# Patient Record
Sex: Female | Born: 1950 | Race: White | Hispanic: No | Smoking: Never smoker
Health system: Southern US, Community
[De-identification: ages and names within clinical notes are randomized; demographics above are authoritative.]

## PROBLEM LIST (undated history)

## (undated) DIAGNOSIS — N189 Chronic kidney disease, unspecified: Secondary | ICD-10-CM

## (undated) DIAGNOSIS — M1990 Unspecified osteoarthritis, unspecified site: Secondary | ICD-10-CM

## (undated) DIAGNOSIS — F32A Depression, unspecified: Secondary | ICD-10-CM

## (undated) DIAGNOSIS — M8580 Other specified disorders of bone density and structure, unspecified site: Secondary | ICD-10-CM

## (undated) DIAGNOSIS — K219 Gastro-esophageal reflux disease without esophagitis: Secondary | ICD-10-CM

## (undated) DIAGNOSIS — G4730 Sleep apnea, unspecified: Secondary | ICD-10-CM

## (undated) DIAGNOSIS — E781 Pure hyperglyceridemia: Secondary | ICD-10-CM

## (undated) DIAGNOSIS — F419 Anxiety disorder, unspecified: Secondary | ICD-10-CM

## (undated) DIAGNOSIS — K589 Irritable bowel syndrome without diarrhea: Secondary | ICD-10-CM

## (undated) DIAGNOSIS — R7301 Impaired fasting glucose: Secondary | ICD-10-CM

## (undated) DIAGNOSIS — E559 Vitamin D deficiency, unspecified: Secondary | ICD-10-CM

## (undated) DIAGNOSIS — H269 Unspecified cataract: Secondary | ICD-10-CM

## (undated) DIAGNOSIS — T7840XA Allergy, unspecified, initial encounter: Secondary | ICD-10-CM

## (undated) DIAGNOSIS — F329 Major depressive disorder, single episode, unspecified: Secondary | ICD-10-CM

## (undated) HISTORY — DX: Chronic kidney disease, unspecified: N18.9

## (undated) HISTORY — DX: Gastro-esophageal reflux disease without esophagitis: K21.9

## (undated) HISTORY — DX: Pure hyperglyceridemia: E78.1

## (undated) HISTORY — DX: Impaired fasting glucose: R73.01

## (undated) HISTORY — DX: Vitamin D deficiency, unspecified: E55.9

## (undated) HISTORY — DX: Unspecified osteoarthritis, unspecified site: M19.90

## (undated) HISTORY — PX: OTHER SURGICAL HISTORY: SHX169

## (undated) HISTORY — DX: Irritable bowel syndrome, unspecified: K58.9

## (undated) HISTORY — DX: Depression, unspecified: F32.A

## (undated) HISTORY — PX: EYE SURGERY: SHX253

## (undated) HISTORY — DX: Unspecified cataract: H26.9

## (undated) HISTORY — DX: Other specified disorders of bone density and structure, unspecified site: M85.80

## (undated) HISTORY — DX: Anxiety disorder, unspecified: F41.9

## (undated) HISTORY — DX: Sleep apnea, unspecified: G47.30

## (undated) HISTORY — DX: Major depressive disorder, single episode, unspecified: F32.9

## (undated) HISTORY — PX: SKIN TAG REMOVAL: SHX780

## (undated) HISTORY — DX: Allergy, unspecified, initial encounter: T78.40XA

---

## 1998-12-23 ENCOUNTER — Encounter: Payer: Self-pay | Admitting: Internal Medicine

## 1998-12-23 ENCOUNTER — Encounter: Admission: RE | Admit: 1998-12-23 | Discharge: 1998-12-23 | Payer: Self-pay | Admitting: Internal Medicine

## 2000-01-24 ENCOUNTER — Encounter: Payer: Self-pay | Admitting: Internal Medicine

## 2000-01-24 ENCOUNTER — Encounter: Admission: RE | Admit: 2000-01-24 | Discharge: 2000-01-24 | Payer: Self-pay | Admitting: Internal Medicine

## 2000-01-30 ENCOUNTER — Encounter: Payer: Self-pay | Admitting: Internal Medicine

## 2000-01-30 ENCOUNTER — Encounter: Admission: RE | Admit: 2000-01-30 | Discharge: 2000-01-30 | Payer: Self-pay | Admitting: Internal Medicine

## 2002-07-30 ENCOUNTER — Encounter: Payer: Self-pay | Admitting: Internal Medicine

## 2002-07-30 ENCOUNTER — Encounter: Admission: RE | Admit: 2002-07-30 | Discharge: 2002-07-30 | Payer: Self-pay | Admitting: Internal Medicine

## 2004-12-04 ENCOUNTER — Ambulatory Visit: Payer: Self-pay | Admitting: Gastroenterology

## 2006-07-19 ENCOUNTER — Ambulatory Visit: Payer: Self-pay

## 2006-12-03 ENCOUNTER — Ambulatory Visit: Payer: Self-pay

## 2010-07-05 LAB — HM PAP SMEAR

## 2010-09-15 ENCOUNTER — Ambulatory Visit: Payer: Self-pay

## 2010-12-12 ENCOUNTER — Ambulatory Visit: Payer: Self-pay | Admitting: Gastroenterology

## 2010-12-12 LAB — HM COLONOSCOPY

## 2010-12-13 ENCOUNTER — Ambulatory Visit: Payer: Self-pay | Admitting: Gastroenterology

## 2013-02-10 ENCOUNTER — Ambulatory Visit: Payer: Self-pay

## 2013-02-10 LAB — HM MAMMOGRAPHY

## 2015-03-02 ENCOUNTER — Other Ambulatory Visit: Payer: Self-pay | Admitting: Unknown Physician Specialty

## 2015-04-29 DIAGNOSIS — E559 Vitamin D deficiency, unspecified: Secondary | ICD-10-CM

## 2015-04-29 DIAGNOSIS — M8580 Other specified disorders of bone density and structure, unspecified site: Secondary | ICD-10-CM

## 2015-04-29 DIAGNOSIS — F332 Major depressive disorder, recurrent severe without psychotic features: Secondary | ICD-10-CM | POA: Insufficient documentation

## 2015-04-29 DIAGNOSIS — J309 Allergic rhinitis, unspecified: Secondary | ICD-10-CM

## 2015-04-29 DIAGNOSIS — K219 Gastro-esophageal reflux disease without esophagitis: Secondary | ICD-10-CM | POA: Insufficient documentation

## 2015-04-29 DIAGNOSIS — R7301 Impaired fasting glucose: Secondary | ICD-10-CM

## 2015-04-29 DIAGNOSIS — K589 Irritable bowel syndrome without diarrhea: Secondary | ICD-10-CM

## 2015-04-29 DIAGNOSIS — G4730 Sleep apnea, unspecified: Secondary | ICD-10-CM

## 2015-04-29 DIAGNOSIS — F419 Anxiety disorder, unspecified: Secondary | ICD-10-CM

## 2015-05-03 ENCOUNTER — Ambulatory Visit (INDEPENDENT_AMBULATORY_CARE_PROVIDER_SITE_OTHER): Payer: BLUE CROSS/BLUE SHIELD | Admitting: Unknown Physician Specialty

## 2015-05-03 ENCOUNTER — Encounter: Payer: Self-pay | Admitting: Unknown Physician Specialty

## 2015-05-03 VITALS — BP 123/74 | HR 78 | Temp 99.1°F | Ht 65.5 in | Wt 196.0 lb

## 2015-05-03 DIAGNOSIS — M654 Radial styloid tenosynovitis [de Quervain]: Secondary | ICD-10-CM

## 2015-05-03 NOTE — Assessment & Plan Note (Signed)
Thumb spica splint

## 2015-05-03 NOTE — Patient Instructions (Addendum)
Use a thumb spica splint.    De Quervain Tenosynovitis Tendons attach muscles to bones. They also help with joint movements. When tendons become irritated or swollen, it is called tendinitis. The extensor pollicis brevis (EPB) tendon connects the EPB muscle to a bone that is near the base of the thumb. The EPB muscle helps to straighten and extend the thumb. De Quervain tenosynovitis is a condition in which the EPB tendon lining (sheath) becomes irritated, thickened, and swollen. This condition is sometimes called stenosing tenosynovitis. This condition causes pain on the thumb side of the back of the wrist. CAUSES Causes of this condition include:  Activities that repeatedly cause your thumb and wrist to extend.  A sudden increase in activity or change in activity that affects your wrist. RISK FACTORS: This condition is more likely to develop in:  Females.  People who have diabetes.  Women who have recently given birth.  People who are over 55 years of age.  People who do activities that involve repeated hand and wrist motions, such as tennis, racquetball, volleyball, gardening, and taking care of children.  People who do heavy labor.  People who have poor wrist strength and flexibility.  People who do not warm up properly before activities. SYMPTOMS Symptoms of this condition include:  Pain or tenderness over the thumb side of the back of the wrist when your thumb and wrist are not moving.  Pain that gets worse when you straighten your thumb or extend your thumb or wrist.  Pain when the injured area is touched.  Locking or catching of the thumb joint while you bend and straighten your thumb.  Decreased thumb motion due to pain.  Swelling over the affected area. DIAGNOSIS This condition is diagnosed with a medical history and physical exam. Your health care provider will ask for details about your injury and ask about your symptoms. TREATMENT Treatment may include the  use of icing and medicines to reduce pain and swelling. You may also be advised to wear a splint or brace to limit your thumb and wrist motion. In less severe cases, treatment may also include working with a physical therapist to strengthen your wrist and calm the irritation around your EPB tendon sheath. In severe cases, surgery may be needed. HOME CARE INSTRUCTIONS If You Have a Splint or Brace:  Wear it as told by your health care provider. Remove it only as told by your health care provider.  Loosen the splint or brace if your fingers become numb and tingle, or if they turn cold and blue.  Keep the splint or brace clean and dry. Managing Pain, Stiffness, and Swelling   If directed, apply ice to the injured area.  Put ice in a plastic bag.  Place a towel between your skin and the bag.  Leave the ice on for 20 minutes, 2-3 times per day.  Move your fingers often to avoid stiffness and to lessen swelling.  Raise (elevate) the injured area above the level of your heart while you are sitting or lying down. General Instructions  Return to your normal activities as told by your health care provider. Ask your health care provider what activities are safe for you.  Take over-the-counter and prescription medicines only as told by your health care provider.  Keep all follow-up visits as told by your health care provider. This is important.  Do not drive or operate heavy machinery while taking prescription pain medicine. SEEK MEDICAL CARE IF:  Your pain, tenderness, or  swelling gets worse, even if you have had treatment.  You have numbness or tingling in your wrist, hand, or fingers on the injured side.   This information is not intended to replace advice given to you by your health care provider. Make sure you discuss any questions you have with your health care provider.   Document Released: 01/29/2005 Document Revised: 10/20/2014 Document Reviewed: 04/06/2014 Elsevier Interactive  Patient Education [employer].

## 2015-05-03 NOTE — Progress Notes (Signed)
BP 123/74 mmHg  Pulse 78  Temp(Src) 99.1 F (37.3 C)  Ht 5' 5.5" (1.664 m)  Wt 196 lb (88.905 kg)  BMI 32.11 kg/m2  SpO2 99%  LMP  (LMP Unknown)   Subjective:    Patient ID: Marie Perry, female    DOB: 24-Sep-1950, 65 y.o.   MRN: [id number]  HPI: Marie Perry is a 65 y.o. female  Chief Complaint  Patient presents with  . Hand Pain    pt states she has been having pain in her right hand, pain when gripping something   Pt c/o right sided hand pain/discomfort. Onset over 1 year ago; pt reports that the pain/discomfort has increased intermittently. Pt states she sometimes feelings like she can not grasp objects well. Recently, the pt noted "knot" at the base of the thumb. Pt states that she has seen a chiropractor and received acupuncture; unknown if the acupuncture helped. Reports numbness and tingling, intermittently. The pain/discomfort  radiates down the arm;  "it feels like an electrical zing". Denies reduced sensation. Denies inability to move joints or joint stiffness. Denies swelling in the hand or joints. Pt states that her pain/discomfort is worse with writing.   Relevant past medical, surgical, family and social history reviewed and updated as indicated. Interim medical history since our last visit reviewed. Allergies and medications reviewed and updated.  Review of Systems  Constitutional: Negative.   HENT: Negative.   Eyes: Negative.   Respiratory: Negative.   Cardiovascular: Negative.   Gastrointestinal: Negative.   Endocrine: Negative.   Genitourinary: Negative.   Musculoskeletal: Positive for arthralgias. Negative for joint swelling.       R thumb and hand pain/discomfort  Skin: Negative.   Allergic/Immunologic: Negative.   Neurological: Positive for numbness (located at the base of the thumb on the right hand; radiates down the arm).  Hematological: Negative.   Psychiatric/Behavioral: Negative.     Per HPI unless specifically indicated above      Objective:    BP 123/74 mmHg  Pulse 78  Temp(Src) 99.1 F (37.3 C)  Ht 5' 5.5" (1.664 m)  Wt 196 lb (88.905 kg)  BMI 32.11 kg/m2  SpO2 99%  LMP  (LMP Unknown)  Wt Readings from Last 3 Encounters:  05/03/15 196 lb (88.905 kg)  06/11/14 199 lb (90.266 kg)    Physical Exam  Constitutional: She is oriented to person, place, and time. She appears well-developed and well-nourished. No distress.  HENT:  Head: Normocephalic and atraumatic.  Eyes: Conjunctivae and lids are normal. Right eye exhibits no discharge. Left eye exhibits no discharge. No scleral icterus.  Cardiovascular: Normal rate.   Pulmonary/Chest: Effort normal.  Abdominal: Normal appearance. There is no splenomegaly or hepatomegaly.  Musculoskeletal:       Right wrist: She exhibits tenderness and swelling.  Neurological: She is alert and oriented to person, place, and time.  Skin: Skin is intact. No rash noted. No pallor.  Psychiatric: She has a normal mood and affect. Her behavior is normal. Judgment and thought content normal.    Results for orders placed or performed in visit on 04/29/15  HM MAMMOGRAPHY  Result Value Ref Range   HM Mammogram from PP   HM PAP SMEAR  Result Value Ref Range   HM Pap smear from PP   HM COLONOSCOPY  Result Value Ref Range   HM Colonoscopy from PP       Assessment & Plan:   Problem List Items Addressed This Visit  Unprioritized   De Quervain's tenosynovitis, right - Primary    Thumb spica splint.            Follow up plan: Return if symptoms worsen or fail to improve.

## 2015-06-24 ENCOUNTER — Encounter: Payer: BLUE CROSS/BLUE SHIELD | Admitting: Unknown Physician Specialty

## 2015-08-17 ENCOUNTER — Encounter: Payer: BLUE CROSS/BLUE SHIELD | Admitting: Unknown Physician Specialty

## 2015-08-27 ENCOUNTER — Other Ambulatory Visit: Payer: Self-pay | Admitting: Unknown Physician Specialty

## 2015-09-13 ENCOUNTER — Encounter (INDEPENDENT_AMBULATORY_CARE_PROVIDER_SITE_OTHER): Payer: Self-pay

## 2015-10-07 ENCOUNTER — Encounter: Payer: Self-pay | Admitting: Unknown Physician Specialty

## 2015-10-07 ENCOUNTER — Ambulatory Visit (INDEPENDENT_AMBULATORY_CARE_PROVIDER_SITE_OTHER): Payer: BLUE CROSS/BLUE SHIELD | Admitting: Unknown Physician Specialty

## 2015-10-07 VITALS — BP 136/92 | HR 94 | Temp 98.0°F | Ht 66.1 in | Wt 196.6 lb

## 2015-10-07 DIAGNOSIS — R7301 Impaired fasting glucose: Secondary | ICD-10-CM | POA: Diagnosis not present

## 2015-10-07 DIAGNOSIS — E669 Obesity, unspecified: Secondary | ICD-10-CM | POA: Diagnosis not present

## 2015-10-07 DIAGNOSIS — Z23 Encounter for immunization: Secondary | ICD-10-CM | POA: Diagnosis not present

## 2015-10-07 DIAGNOSIS — F332 Major depressive disorder, recurrent severe without psychotic features: Secondary | ICD-10-CM

## 2015-10-07 DIAGNOSIS — Z0000 Encounter for general adult medical examination without abnormal findings: Secondary | ICD-10-CM

## 2015-10-07 NOTE — Patient Instructions (Addendum)
Tdap Vaccine (Tetanus, Diphtheria and Pertussis): What You Need to Know 1. Why get vaccinated? Tetanus, diphtheria and pertussis are very serious diseases. Tdap vaccine can protect us from these diseases. And, Tdap vaccine given to pregnant women can protect newborn babies against pertussis. TETANUS (Lockjaw) is rare in the [place] today. It causes painful muscle tightening and stiffness, usually all over the body.  It can lead to tightening of muscles in the head and neck so you can't open your mouth, swallow, or sometimes even breathe. Tetanus kills about 1 out of 10 people who are infected even after receiving the best medical care. DIPHTHERIA is also rare in the [place] today. It can cause a thick coating to form in the back of the throat.  It can lead to breathing problems, heart failure, paralysis, and death. PERTUSSIS (Whooping Cough) causes severe coughing spells, which can cause difficulty breathing, vomiting and disturbed sleep.  It can also lead to weight loss, incontinence, and rib fractures. Up to 2 in 100 adolescents and 5 in 100 adults with pertussis are hospitalized or have complications, which could include pneumonia or death. These diseases are caused by bacteria. Diphtheria and pertussis are spread from person to person through secretions from coughing or sneezing. Tetanus enters the body through cuts, scratches, or wounds. Before vaccines, as many as 200,000 cases of diphtheria, 200,000 cases of pertussis, and hundreds of cases of tetanus, were reported in the [place] each year. Since vaccination began, reports of cases for tetanus and diphtheria have dropped by about 99% and for pertussis by about 80%. 2. Tdap vaccine Tdap vaccine can protect adolescents and adults from tetanus, diphtheria, and pertussis. One dose of Tdap is routinely given at age 11 or 12. People who did not get Tdap at that age should get it as soon as possible. Tdap is especially important  for healthcare professionals and anyone having close contact with a baby younger than 12 months. Pregnant women should get a dose of Tdap during every pregnancy, to protect the newborn from pertussis. Infants are most at risk for severe, life-threatening complications from pertussis. Another vaccine, called Td, protects against tetanus and diphtheria, but not pertussis. A Td booster should be given every 10 years. Tdap may be given as one of these boosters if you have never gotten Tdap before. Tdap may also be given after a severe cut or burn to prevent tetanus infection. Your doctor or the person giving you the vaccine can give you more information. Tdap may safely be given at the same time as other vaccines. 3. Some people should not get this vaccine  A person who has ever had a life-threatening allergic reaction after a previous dose of any diphtheria, tetanus or pertussis containing vaccine, OR has a severe allergy to any part of this vaccine, should not get Tdap vaccine. Tell the person giving the vaccine about any severe allergies.  Anyone who had coma or long repeated seizures within 7 days after a childhood dose of DTP or DTaP, or a previous dose of Tdap, should not get Tdap, unless a cause other than the vaccine was found. They can still get Td.  Talk to your doctor if you:  have seizures or another nervous system problem,  had severe pain or swelling after any vaccine containing diphtheria, tetanus or pertussis,  ever had a condition called Guillain-Barr Syndrome (GBS),  aren't feeling well on the day the shot is scheduled. 4. Risks With any medicine, including vaccines, there is   a chance of side effects. These are usually mild and go away on their own. Serious reactions are also possible but are rare. Most people who get Tdap vaccine do not have any problems with it. Mild problems following Tdap (Did not interfere with activities)  Pain where the shot was given (about 3 in 4  adolescents or 2 in 3 adults)  Redness or swelling where the shot was given (about 1 person in 5)  Mild fever of at least 100.4F (up to about 1 in 25 adolescents or 1 in 100 adults)  Headache (about 3 or 4 people in 10)  Tiredness (about 1 person in 3 or 4)  Nausea, vomiting, diarrhea, stomach ache (up to 1 in 4 adolescents or 1 in 10 adults)  Chills, sore joints (about 1 person in 10)  Body aches (about 1 person in 3 or 4)  Rash, swollen glands (uncommon) Moderate problems following Tdap (Interfered with activities, but did not require medical attention)  Pain where the shot was given (up to 1 in 5 or 6)  Redness or swelling where the shot was given (up to about 1 in 16 adolescents or 1 in 12 adults)  Fever over 102F (about 1 in 100 adolescents or 1 in 250 adults)  Headache (about 1 in 7 adolescents or 1 in 10 adults)  Nausea, vomiting, diarrhea, stomach ache (up to 1 or 3 people in 100)  Swelling of the entire arm where the shot was given (up to about 1 in 500). Severe problems following Tdap (Unable to perform usual activities; required medical attention)  Swelling, severe pain, bleeding and redness in the arm where the shot was given (rare). Problems that could happen after any vaccine:  People sometimes faint after a medical procedure, including vaccination. Sitting or lying down for about 15 minutes can help prevent fainting, and injuries caused by a fall. Tell your doctor if you feel dizzy, or have vision changes or ringing in the ears.  Some people get severe pain in the shoulder and have difficulty moving the arm where a shot was given. This happens very rarely.  Any medication can cause a severe allergic reaction. Such reactions from a vaccine are very rare, estimated at fewer than 1 in a million doses, and would happen within a few minutes to a few hours after the vaccination. As with any medicine, there is a very remote chance of a vaccine causing a serious  injury or death. The safety of vaccines is always being monitored. For more information, visit: www.cdc.gov/vaccinesafety/ 5. What if there is a serious problem? What should I look for?  Look for anything that concerns you, such as signs of a severe allergic reaction, very high fever, or unusual behavior.  Signs of a severe allergic reaction can include hives, swelling of the face and throat, difficulty breathing, a fast heartbeat, dizziness, and weakness. These would usually start a few minutes to a few hours after the vaccination. What should I do?  If you think it is a severe allergic reaction or other emergency that can't wait, call 9-1-1 or get the person to the nearest hospital. Otherwise, call your doctor.  Afterward, the reaction should be reported to the Vaccine Adverse Event Reporting System (VAERS). Your doctor might file this report, or you can do it yourself through the VAERS web site at www.vaers.hhs.gov, or by calling 1-800-822-7967. VAERS does not give medical advice.  6. The National Vaccine Injury Compensation Program The National Vaccine Injury Compensation Program (  VICP) is a federal program that was created to compensate people who may have been injured by certain vaccines. Persons who believe they may have been injured by a vaccine can learn about the program and about filing a claim by calling 587-585-6330 or visiting the [place] website at GoldCloset.com.ee. There is a time limit to file a claim for compensation. 7. How can I learn more?  Ask your doctor. He or she can give you the vaccine package insert or suggest other sources of information.  Call your local or state health department.  Contact the Centers for Disease Control and Prevention (CDC):  Call (540) 216-0407 (1-800-CDC-INFO) or  Visit CDC's website at http://hunter.com/ CDC Tdap Vaccine VIS (04/07/13)   This information is not intended to replace advice given to you by your health care  provider. Make sure you discuss any questions you have with your health care provider.    Check out [facility] (respite services)

## 2015-10-07 NOTE — Assessment & Plan Note (Signed)
Feels she is doing well considering care responsibilities for her mother

## 2015-10-07 NOTE — Assessment & Plan Note (Signed)
Check CMP.  ?

## 2015-10-07 NOTE — Progress Notes (Signed)
BP (!) 136/92 (BP Location: Left Arm, Patient Position: Sitting, Cuff Size: Large)   Pulse 94   Temp 98 F (36.7 C)   Ht 5' 6.1" (1.679 m)   Wt 196 lb 9.6 oz (89.2 kg)   LMP  (LMP Unknown)   SpO2 96%   BMI 31.64 kg/m    Subjective:    Patient ID: Marie Perry, female    DOB: 07/22/1950, 65 y.o.   MRN: [id number]  HPI: Marie Perry is a 65 y.o. female  Chief Complaint  Patient presents with  . Annual Exam   In general aches and pains while getting older.    Depression Taking Cymbalta 60 mg.  Depression screen PHQ 2/9 10/07/2015  Decreased Interest 2  Down, Depressed, Hopeless 1  PHQ - 2 Score 3  Altered sleeping 2  Tired, decreased energy 2  Change in appetite 1  Feeling bad or failure about yourself  2  Trouble concentrating 2  Moving slowly or fidgety/restless 0  Suicidal thoughts 0  PHQ-9 Score 12   Past Surgical History:  Procedure Laterality Date  . bright's surgery    . SKIN TAG REMOVAL     rectal   Past Medical History:  Diagnosis Date  . Allergy   . Anxiety   . Depression   . GERD (gastroesophageal reflux disease)   . IBS (irritable bowel syndrome)   . IFG (impaired fasting glucose)   . Osteopenia   . Sleep apnea   . Vitamin D deficiency    Social History   Social History  . Marital status: Unknown    Spouse name: N/A  . Number of children: N/A  . Years of education: N/A   Occupational History  . Not on file.   Social History Main Topics  . Smoking status: Never Smoker  . Smokeless tobacco: Never Used  . Alcohol use 0.0 oz/week     Comment: on occasion  . Drug use: No  . Sexual activity: No   Other Topics Concern  . Not on file   Social History Narrative  . No narrative on file   Family History  Problem Relation Age of Onset  . Mental illness Mother   . Depression Mother   . Stroke Father   . Heart disease Father   . Cancer Maternal Grandmother     ovarian  . Anemia Maternal Grandmother   . Lung disease Maternal  Grandfather   . Birth defects Maternal Grandfather   . Depression Sister   . Fibromyalgia Brother   . GER disease Brother   . Heart disease Paternal Grandmother   . Birth defects Paternal Grandfather   . Depression Brother     Relevant past medical, surgical, family and social history reviewed and updated as indicated. Interim medical history since our last visit reviewed. Allergies and medications reviewed and updated.  Review of Systems  Per HPI unless specifically indicated above     Objective:    BP (!) 136/92 (BP Location: Left Arm, Patient Position: Sitting, Cuff Size: Large)   Pulse 94   Temp 98 F (36.7 C)   Ht 5' 6.1" (1.679 m)   Wt 196 lb 9.6 oz (89.2 kg)   LMP  (LMP Unknown)   SpO2 96%   BMI 31.64 kg/m   Wt Readings from Last 3 Encounters:  10/07/15 196 lb 9.6 oz (89.2 kg)  05/03/15 196 lb (88.9 kg)  06/11/14 199 lb (90.3 kg)    Physical Exam  Constitutional: She  is oriented to person, place, and time. She appears well-developed and well-nourished.  HENT:  Head: Normocephalic and atraumatic.  Eyes: Pupils are equal, round, and reactive to light. Right eye exhibits no discharge. Left eye exhibits no discharge. No scleral icterus.  Neck: Normal range of motion. Neck supple. Carotid bruit is not present. No thyromegaly present.  Cardiovascular: Normal rate, regular rhythm and normal heart sounds.  Exam reveals no gallop and no friction rub.   No murmur heard. Pulmonary/Chest: Effort normal and breath sounds normal. No respiratory distress. She has no wheezes. She has no rales.  Abdominal: Soft. Bowel sounds are normal. There is no tenderness. There is no rebound.  Genitourinary: Vagina normal and uterus normal. No breast swelling, tenderness or discharge. Cervix exhibits no motion tenderness, no discharge and no friability. Right adnexum displays no mass, no tenderness and no fullness. Left adnexum displays no mass, no tenderness and no fullness.    Musculoskeletal: Normal range of motion.  Lymphadenopathy:    She has no cervical adenopathy.  Neurological: She is alert and oriented to person, place, and time.  Skin: Skin is warm, dry and intact. No rash noted.  Psychiatric: She has a normal mood and affect. Her speech is normal and behavior is normal. Judgment and thought content normal. Cognition and memory are normal.       Assessment & Plan:   Problem List Items Addressed This Visit      Unprioritized   IFG (impaired fasting glucose)    Check CMP      Obesity    Discuss diet and exercise      Recurrent major depression-severe ([place])    Feels she is doing well considering care responsibilities for her mother       Other Visit Diagnoses    Need for diphtheria-tetanus-pertussis (Tdap) vaccine, adult/adolescent    -  Primary   Relevant Orders   Tdap vaccine greater than or equal to 7yo IM (Completed)   Annual physical exam       Relevant Orders   CBC with Differential/Platelet   Comprehensive metabolic panel   IGP, Aptima HPV, rfx 16/18,45   Lipid Panel w/o Chol/HDL Ratio   TSH   MM DIGITAL SCREENING BILATERAL       Follow up plan: Return if symptoms worsen or fail to improve.

## 2015-10-07 NOTE — Assessment & Plan Note (Signed)
Discuss diet and exercise 

## 2015-10-08 LAB — CBC WITH DIFFERENTIAL/PLATELET
Basophils Absolute: 0 10*3/uL (ref 0.0–0.2)
Basos: 0 %
EOS (ABSOLUTE): 0.2 10*3/uL (ref 0.0–0.4)
EOS: 2 %
HEMATOCRIT: 44 % (ref 34.0–46.6)
HEMOGLOBIN: 14.5 g/dL (ref 11.1–15.9)
IMMATURE GRANS (ABS): 0 10*3/uL (ref 0.0–0.1)
IMMATURE GRANULOCYTES: 0 %
LYMPHS ABS: 2.4 10*3/uL (ref 0.7–3.1)
Lymphs: 29 %
MCH: 28.8 pg (ref 26.6–33.0)
MCHC: 33 g/dL (ref 31.5–35.7)
MCV: 87 fL (ref 79–97)
Monocytes Absolute: 0.6 10*3/uL (ref 0.1–0.9)
Monocytes: 7 %
Neutrophils Absolute: 5.1 10*3/uL (ref 1.4–7.0)
Neutrophils: 62 %
Platelets: 233 10*3/uL (ref 150–379)
RBC: 5.04 x10E6/uL (ref 3.77–5.28)
RDW: 14.1 % (ref 12.3–15.4)
WBC: 8.3 10*3/uL (ref 3.4–10.8)

## 2015-10-08 LAB — LIPID PANEL W/O CHOL/HDL RATIO
Cholesterol, Total: 232 mg/dL — ABNORMAL HIGH (ref 100–199)
HDL: 44 mg/dL (ref >39)
Triglycerides: 403 mg/dL — ABNORMAL HIGH (ref 0–149)

## 2015-10-08 LAB — COMPREHENSIVE METABOLIC PANEL
ALBUMIN: 4.6 g/dL (ref 3.6–4.8)
ALT: 21 IU/L (ref 0–32)
AST: 20 IU/L (ref 0–40)
Albumin/Globulin Ratio: 1.8 (ref 1.2–2.2)
Alkaline Phosphatase: 95 IU/L (ref 39–117)
BUN / CREAT RATIO: 25 (ref 12–28)
BUN: 21 mg/dL (ref 8–27)
Bilirubin Total: 0.4 mg/dL (ref 0.0–1.2)
CALCIUM: 10.5 mg/dL — AB (ref 8.7–10.3)
CO2: 24 mmol/L (ref 18–29)
CREATININE: 0.85 mg/dL (ref 0.57–1.00)
Chloride: 102 mmol/L (ref 96–106)
GFR, EST AFRICAN AMERICAN: 84 mL/min/{1.73_m2} (ref >59)
GFR, EST NON AFRICAN AMERICAN: 73 mL/min/{1.73_m2} (ref >59)
GLOBULIN, TOTAL: 2.5 g/dL (ref 1.5–4.5)
Glucose: 83 mg/dL (ref 65–99)
Potassium: 4.4 mmol/L (ref 3.5–5.2)
SODIUM: 142 mmol/L (ref 134–144)
TOTAL PROTEIN: 7.1 g/dL (ref 6.0–8.5)

## 2015-10-08 LAB — TSH: TSH: 1.89 u[IU]/mL (ref 0.450–4.500)

## 2015-10-10 ENCOUNTER — Other Ambulatory Visit: Payer: Self-pay | Admitting: Unknown Physician Specialty

## 2015-10-10 DIAGNOSIS — E781 Pure hyperglyceridemia: Secondary | ICD-10-CM

## 2015-10-11 LAB — IGP, APTIMA HPV, RFX 16/18,45
HPV APTIMA: NEGATIVE
PAP Smear Comment: 0

## 2015-11-28 ENCOUNTER — Other Ambulatory Visit: Payer: Self-pay | Admitting: Unknown Physician Specialty

## 2015-12-02 NOTE — Telephone Encounter (Signed)
Your patient 

## 2015-12-07 ENCOUNTER — Telehealth: Payer: Self-pay | Admitting: Unknown Physician Specialty

## 2015-12-07 NOTE — Telephone Encounter (Signed)
Routing to [place] since patient specifically asked for her.

## 2015-12-07 NOTE — Telephone Encounter (Signed)
Discussed with pt about mammograms.  Order is in the chart and she will call for the appointment

## 2016-02-13 DIAGNOSIS — H269 Unspecified cataract: Secondary | ICD-10-CM

## 2016-02-13 HISTORY — DX: Unspecified cataract: H26.9

## 2016-02-24 ENCOUNTER — Ambulatory Visit
Admission: RE | Admit: 2016-02-24 | Discharge: 2016-02-24 | Disposition: A | Discharge status: Home / Self Care | Payer: Medicare Other | Source: Ambulatory Visit | Attending: Unknown Physician Specialty | Admitting: Unknown Physician Specialty

## 2016-02-24 DIAGNOSIS — Z1231 Encounter for screening mammogram for malignant neoplasm of breast: Secondary | ICD-10-CM | POA: Insufficient documentation

## 2016-02-24 DIAGNOSIS — Z0000 Encounter for general adult medical examination without abnormal findings: Secondary | ICD-10-CM

## 2016-02-25 ENCOUNTER — Other Ambulatory Visit: Payer: Self-pay | Admitting: Unknown Physician Specialty

## 2016-02-27 ENCOUNTER — Other Ambulatory Visit: Payer: Self-pay | Admitting: Unknown Physician Specialty

## 2016-02-27 ENCOUNTER — Telehealth: Payer: Self-pay | Admitting: Unknown Physician Specialty

## 2016-02-27 DIAGNOSIS — N6320 Unspecified lump in the left breast, unspecified quadrant: Secondary | ICD-10-CM

## 2016-02-27 DIAGNOSIS — R928 Other abnormal and inconclusive findings on diagnostic imaging of breast: Secondary | ICD-10-CM

## 2016-02-27 NOTE — Telephone Encounter (Signed)
Patient was told to call our office to request her old sleep study records.  She could not remember when this was done.  Thank Dennis Bast Santiago Glad  [phone]

## 2016-02-28 NOTE — Telephone Encounter (Signed)
Called and spoke to a lady at [facility]. She stated that they did have a copy of the patient's sleep report from 2008. I asked about the patient mentioning that she needed supplies for her CPAP and she provided me the number to the [facility] office who she stated handled the supplies, [phone]. Will call patient again and ask her if she is indeed needing supplies and ask her who the fax number she provided belongs to.

## 2016-02-28 NOTE — Telephone Encounter (Signed)
Called and spoke to patient. She stated that the fax number was given to her by the lady at the [facility]. I told the patient that they stated that they had the sleep study report from 2008. I asked the patient if she was just needing supplies and she stated pretty much. I let the patient know that I was given a number for the supply office in [place] and I would call them first thing tomorrow being that it is after 5 pm. Patient stated that was fine and thanked me for the help.

## 2016-02-28 NOTE — Telephone Encounter (Signed)
Called and spoke with patient. She stated that she thinks that the place she had her sleep study done years ago does not have a copy of it (we have a copy in the old system). Patient stated that she thinks she is needing new supplies. She provided me with a fax number of [phone]. I looked at the sleep study report we have and I am going to call [facility] to see exactly what they are needing.

## 2016-03-02 NOTE — Telephone Encounter (Signed)
Called and left a VM with [facility] supply asking for them to please return my call.

## 2016-03-05 NOTE — Telephone Encounter (Signed)
Aaron Edelman from [place] returned my call. I told him what was going on and he stated that we needed to do an updated CMN for the patient's CPAP supplies. Aaron Edelman stated he would fax this to me first thing in the morning. He stated it would then be up to the insurance company as to what happens next. Will wait for form to be faxed in the morning, fill it out, and fax it back to [facility]. Will then call and update patient as to what is going on.

## 2016-03-05 NOTE — Telephone Encounter (Signed)
Aaron Edelman from [place] returned my call but I was not able to take his call at the time. I called him back and left him a VM letting him know that patient's name, DOB, and what she was needing. I asked for Aaron Edelman to please return my call when he could.

## 2016-03-06 NOTE — Telephone Encounter (Signed)
Called and let patient know about the fax I received from [place] at [place]. I let her know that we filled it out and faxed it back to him. I also let the patient know that Marie Perry stated that once he gets the form back from [place], then it was pretty much up to the insurance at that point. Patient understood.

## 2016-03-06 NOTE — Telephone Encounter (Signed)
CMN received from [place] at [facility]. Signed by Malachy Mood and faxed back. Will call patient and let her know.

## 2016-03-08 ENCOUNTER — Ambulatory Visit
Admission: RE | Admit: 2016-03-08 | Discharge: 2016-03-08 | Disposition: A | Discharge status: Home / Self Care | Payer: Medicare Other | Source: Ambulatory Visit | Attending: Unknown Physician Specialty | Admitting: Unknown Physician Specialty

## 2016-03-08 DIAGNOSIS — N6324 Unspecified lump in the left breast, lower inner quadrant: Secondary | ICD-10-CM | POA: Insufficient documentation

## 2016-03-08 DIAGNOSIS — R928 Other abnormal and inconclusive findings on diagnostic imaging of breast: Secondary | ICD-10-CM

## 2016-03-08 DIAGNOSIS — N6320 Unspecified lump in the left breast, unspecified quadrant: Secondary | ICD-10-CM

## 2016-04-03 ENCOUNTER — Encounter: Payer: Self-pay | Admitting: Unknown Physician Specialty

## 2016-04-03 ENCOUNTER — Ambulatory Visit (INDEPENDENT_AMBULATORY_CARE_PROVIDER_SITE_OTHER): Payer: Medicare Other | Admitting: Unknown Physician Specialty

## 2016-04-03 DIAGNOSIS — G4733 Obstructive sleep apnea (adult) (pediatric): Secondary | ICD-10-CM | POA: Diagnosis not present

## 2016-04-03 NOTE — Progress Notes (Signed)
   BP 114/75 (BP Location: Left Arm, Patient Position: Sitting, Cuff Size: Normal)   Pulse 79   Temp 98.7 F (37.1 C)   Wt 199 lb 12.8 oz (90.6 kg)   LMP  (LMP Unknown)   SpO2 96%   BMI 32.15 kg/m    Subjective:    Patient ID: Marie Perry, female    DOB: [birth year], 66 y.o.   MRN: [id number]  HPI: Marie Perry is a 66 y.o. female  Chief Complaint  Patient presents with  . CPAP    pt is here because she needs new CPAP supplies. Note needs to have documetation about CPAP usage and benefits for Medicare purposes.    Pt currently has a CPAP she has been using for 10 years.  She needs a new machine as it's not working.  States she uses it nightly.  She states her energy level is good when she uses her CPAP.  Not falling asleep at the wheel.  She feels rested in the AM   Relevant past medical, surgical, family and social history reviewed and updated as indicated. Interim medical history since our last visit reviewed. Allergies and medications reviewed and updated.  Review of Systems  Per HPI unless specifically indicated above     Objective:    BP 114/75 (BP Location: Left Arm, Patient Position: Sitting, Cuff Size: Normal)   Pulse 79   Temp 98.7 F (37.1 C)   Wt 199 lb 12.8 oz (90.6 kg)   LMP  (LMP Unknown)   SpO2 96%   BMI 32.15 kg/m   Wt Readings from Last 3 Encounters:  04/03/16 199 lb 12.8 oz (90.6 kg)  10/07/15 196 lb 9.6 oz (89.2 kg)  05/03/15 196 lb (88.9 kg)    Physical Exam  Constitutional: She is oriented to person, place, and time. She appears well-developed and well-nourished. No distress.  HENT:  Head: Normocephalic and atraumatic.  Eyes: Conjunctivae and lids are normal. Right eye exhibits no discharge. Left eye exhibits no discharge. No scleral icterus.  Cardiovascular: Normal rate.   Pulmonary/Chest: Effort normal.  Abdominal: Normal appearance. There is no splenomegaly or hepatomegaly.  Musculoskeletal: Normal range of motion.  Neurological: She is  alert and oriented to person, place, and time.  Skin: Skin is intact. No rash noted. No pallor.  Psychiatric: She has a normal mood and affect. Her behavior is normal. Judgment and thought content normal.      Assessment & Plan:   Problem List Items Addressed This Visit      Unprioritized   Sleep apnea    Needs new CPAP supplies.        Relevant Orders   For home use only DME continuous positive airway pressure (CPAP)       Follow up plan: Return if symptoms worsen or fail to improve.

## 2016-04-03 NOTE — Assessment & Plan Note (Signed)
Needs new CPAP supplies.

## 2016-04-13 DIAGNOSIS — Z860100 Personal history of colon polyps, unspecified: Secondary | ICD-10-CM | POA: Insufficient documentation

## 2016-05-25 ENCOUNTER — Other Ambulatory Visit: Payer: Self-pay | Admitting: Unknown Physician Specialty

## 2016-06-05 DIAGNOSIS — H1045 Other chronic allergic conjunctivitis: Secondary | ICD-10-CM | POA: Diagnosis not present

## 2016-06-05 DIAGNOSIS — H2513 Age-related nuclear cataract, bilateral: Secondary | ICD-10-CM | POA: Diagnosis not present

## 2016-08-01 ENCOUNTER — Telehealth: Payer: Self-pay | Admitting: Unknown Physician Specialty

## 2016-08-01 ENCOUNTER — Other Ambulatory Visit: Payer: Self-pay

## 2016-08-01 DIAGNOSIS — N6320 Unspecified lump in the left breast, unspecified quadrant: Secondary | ICD-10-CM

## 2016-08-01 DIAGNOSIS — R928 Other abnormal and inconclusive findings on diagnostic imaging of breast: Secondary | ICD-10-CM

## 2016-08-01 NOTE — Telephone Encounter (Signed)
Looked in patient's chart on last mammogram report and additional images were recommended for possible left breast mass. Called and made sure that this is what the patient was talking about and she said it was. Will enter orders, have Cheryl sign off, and patient stated she would call to schedule her mammogram.

## 2016-08-01 NOTE — Telephone Encounter (Signed)
Patient called to see if we could ref her for a diagnostic mammogram per patient she called to schedule one but they told her we would need to request a diagnostic   Thanks  [phone]

## 2016-08-22 ENCOUNTER — Other Ambulatory Visit: Payer: Self-pay | Admitting: Unknown Physician Specialty

## 2016-09-06 ENCOUNTER — Ambulatory Visit
Admission: RE | Admit: 2016-09-06 | Discharge: 2016-09-06 | Disposition: A | Discharge status: Home / Self Care | Payer: Medicare Other | Source: Ambulatory Visit | Attending: Unknown Physician Specialty | Admitting: Unknown Physician Specialty

## 2016-09-06 DIAGNOSIS — N6489 Other specified disorders of breast: Secondary | ICD-10-CM | POA: Diagnosis not present

## 2016-09-06 DIAGNOSIS — R928 Other abnormal and inconclusive findings on diagnostic imaging of breast: Secondary | ICD-10-CM

## 2016-09-17 ENCOUNTER — Ambulatory Visit: Payer: Medicare Other | Admitting: Registered Nurse

## 2016-09-17 ENCOUNTER — Encounter
Admission: RE | Disposition: A | Discharge status: Home / Self Care | Payer: Self-pay | Source: Ambulatory Visit | Attending: Gastroenterology

## 2016-09-17 ENCOUNTER — Ambulatory Visit
Admission: RE | Admit: 2016-09-17 | Discharge: 2016-09-17 | Disposition: A | Discharge status: Home / Self Care | Payer: Medicare Other | Source: Ambulatory Visit | Attending: Gastroenterology | Admitting: Gastroenterology

## 2016-09-17 DIAGNOSIS — F329 Major depressive disorder, single episode, unspecified: Secondary | ICD-10-CM | POA: Insufficient documentation

## 2016-09-17 DIAGNOSIS — Z1211 Encounter for screening for malignant neoplasm of colon: Secondary | ICD-10-CM | POA: Diagnosis not present

## 2016-09-17 DIAGNOSIS — G4730 Sleep apnea, unspecified: Secondary | ICD-10-CM | POA: Diagnosis not present

## 2016-09-17 DIAGNOSIS — Z86010 Personal history of colonic polyps: Secondary | ICD-10-CM | POA: Insufficient documentation

## 2016-09-17 DIAGNOSIS — D123 Benign neoplasm of transverse colon: Secondary | ICD-10-CM | POA: Diagnosis not present

## 2016-09-17 DIAGNOSIS — K6389 Other specified diseases of intestine: Secondary | ICD-10-CM | POA: Diagnosis not present

## 2016-09-17 DIAGNOSIS — K5730 Diverticulosis of large intestine without perforation or abscess without bleeding: Secondary | ICD-10-CM | POA: Diagnosis not present

## 2016-09-17 DIAGNOSIS — Z538 Procedure and treatment not carried out for other reasons: Secondary | ICD-10-CM | POA: Diagnosis not present

## 2016-09-17 DIAGNOSIS — K635 Polyp of colon: Secondary | ICD-10-CM | POA: Diagnosis not present

## 2016-09-17 DIAGNOSIS — E559 Vitamin D deficiency, unspecified: Secondary | ICD-10-CM | POA: Diagnosis not present

## 2016-09-17 DIAGNOSIS — Z79899 Other long term (current) drug therapy: Secondary | ICD-10-CM | POA: Insufficient documentation

## 2016-09-17 DIAGNOSIS — F419 Anxiety disorder, unspecified: Secondary | ICD-10-CM | POA: Diagnosis not present

## 2016-09-17 DIAGNOSIS — K5790 Diverticulosis of intestine, part unspecified, without perforation or abscess without bleeding: Secondary | ICD-10-CM | POA: Diagnosis not present

## 2016-09-17 HISTORY — PX: COLONOSCOPY: SHX5424

## 2016-09-17 SURGERY — COLONOSCOPY
Anesthesia: General

## 2016-09-17 MED ORDER — MIDAZOLAM HCL 2 MG/2ML IJ SOLN
INTRAMUSCULAR | Status: DC | PRN
  Administered 2016-09-17: 1 mg via INTRAVENOUS

## 2016-09-17 MED ORDER — PROPOFOL 10 MG/ML IV BOLUS
INTRAVENOUS | Status: DC | PRN
  Administered 2016-09-17: 70 mg via INTRAVENOUS

## 2016-09-17 MED ORDER — PHENYLEPHRINE HCL 10 MG/ML IJ SOLN
INTRAMUSCULAR | Status: DC | PRN
  Administered 2016-09-17 (×2): 50 ug via INTRAVENOUS

## 2016-09-17 MED ORDER — SODIUM CHLORIDE 0.9 % IV SOLN: INTRAVENOUS | Status: DC

## 2016-09-17 MED ORDER — SODIUM CHLORIDE 0.9 % IV SOLN
INTRAVENOUS | Status: DC
  Administered 2016-09-17: 10:00:00 via INTRAVENOUS

## 2016-09-17 MED ORDER — PROPOFOL 500 MG/50ML IV EMUL
INTRAVENOUS | Status: DC | PRN
Start: 2016-09-17 — End: 2016-09-17
  Administered 2016-09-17: 140 ug/kg/min via INTRAVENOUS

## 2016-09-17 MED ORDER — LIDOCAINE HCL (CARDIAC) 20 MG/ML IV SOLN
INTRAVENOUS | Status: DC | PRN
  Administered 2016-09-17: 40 mg via INTRAVENOUS

## 2016-09-17 MED ORDER — PROPOFOL 500 MG/50ML IV EMUL
INTRAVENOUS | Status: AC
  Filled 2016-09-17: qty 50

## 2016-09-17 MED ORDER — MIDAZOLAM HCL 2 MG/2ML IJ SOLN
INTRAMUSCULAR | Status: AC
  Filled 2016-09-17: qty 2

## 2016-09-17 NOTE — Anesthesia Post-op Follow-up Note (Cosign Needed)
Anesthesia QCDR form completed.        

## 2016-09-17 NOTE — Op Note (Addendum)
[facility] Patient Name: Marie Perry Procedure Date: 09/17/2016 10:00 AM MRN: [id number] Account #: [id number] Date of Birth: November 29, 1950 Admit Type: Outpatient Age: 66 Room: [facility] ENDO ROOM 1 Gender: Female Note Status: Finalized Procedure:            Colonoscopy Indications:          Personal history of colonic polyps Providers:            Lollie Sails, MD Referring MD:         Kathrine Haddock (Referring MD) Medicines:            Monitored Anesthesia Care Complications:        No immediate complications. Procedure:            Pre-Anesthesia Assessment:                       - ASA Grade Assessment: II - A patient with mild                        systemic disease.                       After obtaining informed consent, the colonoscope was                        passed under direct vision. Throughout the procedure,                        the patient's blood pressure, pulse, and oxygen                        saturations were monitored continuously. The                        Colonoscope was introduced through the anus with the                        intention of advancing to the cecum. The scope was                        advanced to the transverse colon before the procedure                        was aborted. Medications were given. The colonoscopy                        was extremely difficult due to poor bowel prep with                        stool present. The patient tolerated the procedure well. Findings:      A 6 mm polyp was found in the splenic flexure. The polyp was sessile.       The polyp was removed with a cold snare. Resection was complete, but the       polyp tissue was not retrieved.      Multiple small-mouthed diverticula were found in the sigmoid colon and       descending colon.      A large amount of semi-solid stool was found in the transverse colon,       precluding visualization.  A moderate amount of semi-liquid  stool was found in the rectum, in the       sigmoid colon and in the descending colon, making visualization       difficult.      A diffuse area of mild melanosis was found in the entire colon. Impression:           - One 7 mm polyp at the splenic flexure, removed with a                        cold snare. Complete resection. Polyp tissue not                        retrieved.                       - Diverticulosis in the sigmoid colon and in the                        descending colon.                       - Stool in the transverse colon.                       - Stool in the rectum, in the sigmoid colon and in the                        descending colon. Recommendation:       - Discharge patient to home.                       - repeat full prep and reschedule. Procedure Code(s):    --- Professional ---                       [id number], 52, Colonoscopy, flexible; with removal of                        tumor(s), polyp(s), or other lesion(s) by snare                        technique Diagnosis Code(s):    --- Professional ---                       D12.3, Benign neoplasm of transverse colon (hepatic                        flexure or splenic flexure)                       Z86.010, Personal history of colonic polyps                       K57.30, Diverticulosis of large intestine without                        perforation or abscess without bleeding CPT copyright 2016 American Medical Association. All rights reserved. The codes documented in this report are preliminary and upon coder review may  be revised to meet current compliance requirements. Lollie Sails, MD 09/17/2016 10:43:00 AM This report has been signed electronically. Number of Addenda: 0 Note  Initiated On: 09/17/2016 10:00 AM Scope Withdrawal Time: 0 hours 3 minutes 11 seconds  Total Procedure Duration: 0 hours 27 minutes 33 seconds       [facility]

## 2016-09-17 NOTE — Anesthesia Preprocedure Evaluation (Signed)
Anesthesia Evaluation  Patient identified by MRN, date of birth, ID band Patient awake    Reviewed: Allergy & Precautions, H&P , NPO status , [name] Chart, lab work & pertinent test results  Airway Mallampati: III  TM Distance: >3 FB Neck ROM: limited    Dental  (+) Poor Dentition   Pulmonary sleep apnea ,           Cardiovascular Exercise Tolerance: Good (-) Past MI and (-) DOE negative cardio ROS       Neuro/Psych PSYCHIATRIC DISORDERS negative neurological ROS     GI/Hepatic Neg liver ROS, GERD  Medicated and Controlled,  Endo/Other  negative endocrine ROS  Renal/GU negative Renal ROS  negative genitourinary   Musculoskeletal   Abdominal   Peds  Hematology negative hematology ROS (+)   Anesthesia Other Findings Past Medical History: No date: Allergy No date: Anxiety No date: Depression No date: GERD (gastroesophageal reflux disease) No date: IBS (irritable bowel syndrome) No date: IFG (impaired fasting glucose) No date: Osteopenia No date: Sleep apnea No date: Vitamin D deficiency  Past Surgical History: No date: bright's surgery No date: SKIN TAG REMOVAL     Comment:  rectal  BMI    Body Mass Index:  27.88 kg/m      Reproductive/Obstetrics negative OB ROS                             Anesthesia Physical Anesthesia Plan  ASA: III  Anesthesia Plan: General   Post-op Pain Management:    Induction: Intravenous  PONV Risk Score and Plan:   Airway Management Planned: Natural Airway and Nasal Cannula  Additional Equipment:   Intra-op Plan:   Post-operative Plan:   Informed Consent: I have reviewed the patients History and Physical, chart, labs and discussed the procedure including the risks, benefits and alternatives for the proposed anesthesia with the patient or authorized representative who has indicated his/her understanding and acceptance.   Dental  Advisory Given  Plan Discussed with: Anesthesiologist, CRNA and Surgeon  Anesthesia Plan Comments: (Patient consented for risks of anesthesia including but not limited to:  - adverse reactions to medications - risk of intubation if required - damage to teeth, lips or other oral mucosa - sore throat or hoarseness - Damage to heart, brain, lungs or loss of life  Patient voiced understanding.)        Anesthesia Quick Evaluation

## 2016-09-17 NOTE — Anesthesia Postprocedure Evaluation (Signed)
Anesthesia Post Note  Patient: Marie Perry  Procedure(s) Performed: Procedure(s) (LRB): COLONOSCOPY (N/A)  Patient location during evaluation: Endoscopy Anesthesia Type: General Level of consciousness: awake and alert Pain management: pain level controlled Vital Signs Assessment: post-procedure vital signs reviewed and stable Respiratory status: spontaneous breathing, nonlabored ventilation, respiratory function stable and patient connected to nasal cannula oxygen Cardiovascular status: blood pressure returned to baseline and stable Postop Assessment: no signs of nausea or vomiting Anesthetic complications: no     Last Vitals:  Vitals:   09/17/16 1058 09/17/16 1118  BP: 97/75 118/86  Pulse: 69 62  Resp: 15 16  Temp:      Last Pain:  Vitals:   09/17/16 1038  TempSrc: Tympanic  PainSc:                  Precious Haws Genisis Sonnier

## 2016-09-17 NOTE — Transfer of Care (Signed)
Immediate Anesthesia Transfer of Care Note  Patient: Marie Perry  Procedure(s) Performed: Procedure(s): COLONOSCOPY (N/A)  Patient Location: PACU  Anesthesia Type:General  Level of Consciousness: sedated  Airway & Oxygen Therapy: Patient Spontanous Breathing and Patient connected to nasal cannula oxygen  Post-op Assessment: Report given to RN and Post -op Vital signs reviewed and stable  Post vital signs: Reviewed and stable  Last Vitals:  Vitals:   09/17/16 0932 09/17/16 1038  BP: (!) 124/56 (!) 90/43  Pulse: 81 68  Resp: 18 14  Temp: 36.6 C 36.4 C    Last Pain:  Vitals:   09/17/16 1038  TempSrc: Tympanic  PainSc:          Complications: No apparent anesthesia complications

## 2016-09-17 NOTE — Anesthesia Procedure Notes (Signed)
Date/Time: 09/17/2016 10:05 AM Performed by: Hedda Slade Pre-anesthesia Checklist: Emergency Drugs available, Suction available, Patient being monitored and Patient identified Patient Re-evaluated:Patient Re-evaluated prior to induction Oxygen Delivery Method: Nasal cannula

## 2016-09-17 NOTE — H&P (Signed)
Outpatient short stay form Pre-procedure 09/17/2016 9:54 AM Lollie Sails MD  Primary Physician: Granville Lewis NP  Reason for visit:  Colonoscopy  History of present illness:  Patient is a 66 year old female presenting today as above. She has a personal history of her last colonoscopy being done in 2012. There were no polyps at that time however previous procedure did show these. She tolerated her prep well. She takes no aspirin or blood thinning agents.    Current Facility-Administered Medications:  .  0.9 %  sodium chloride infusion, , Intravenous, Continuous, Lollie Sails, MD, Last Rate: 20 mL/hr at 09/17/16 0947 .  0.9 %  sodium chloride infusion, , Intravenous, Continuous, Lollie Sails, MD  Prescriptions Prior to Admission  Medication Sig Dispense Refill Last Dose  . DULoxetine (CYMBALTA) 60 MG capsule TAKE 1 CAPSULE BY MOUTH EVERY DAY. 90 capsule 0 09/16/2016 at Unknown time  . Cholecalciferol (VITAMIN D PO) Take by mouth daily.   09/14/2016  . Cyanocobalamin (B-12 PO) Take by mouth daily.   09/14/2016     No Known Allergies   Past Medical History:  Diagnosis Date  . Allergy   . Anxiety   . Depression   . GERD (gastroesophageal reflux disease)   . IBS (irritable bowel syndrome)   . IFG (impaired fasting glucose)   . Osteopenia   . Sleep apnea   . Vitamin D deficiency     Review of systems:      Physical Exam    Heart and lungs: Regular rate and rhythm without rub or gallop, lungs are bilaterally clear.    HEENT: Normocephalic atraumatic eyes are anicteric    Other:     Pertinant exam for procedure: Soft nontender nondistended bowel sounds positive normoactive.    Planned proceedures: Colonoscopy and indicated procedures. I have discussed the risks benefits and complications of procedures to include not limited to bleeding, infection, perforation and the risk of sedation and the patient wishes to proceed.    Lollie Sails,  MD Gastroenterology 09/17/2016  9:54 AM

## 2016-09-18 ENCOUNTER — Encounter: Payer: Self-pay | Admitting: Gastroenterology

## 2016-11-09 DIAGNOSIS — Z23 Encounter for immunization: Secondary | ICD-10-CM | POA: Diagnosis not present

## 2016-11-13 ENCOUNTER — Other Ambulatory Visit: Payer: Self-pay | Admitting: Unknown Physician Specialty

## 2017-02-09 ENCOUNTER — Other Ambulatory Visit: Payer: Self-pay | Admitting: Unknown Physician Specialty

## 2017-02-20 ENCOUNTER — Other Ambulatory Visit: Payer: Self-pay | Admitting: Unknown Physician Specialty

## 2017-02-22 ENCOUNTER — Ambulatory Visit (INDEPENDENT_AMBULATORY_CARE_PROVIDER_SITE_OTHER): Payer: Medicare Other

## 2017-02-22 ENCOUNTER — Ambulatory Visit (INDEPENDENT_AMBULATORY_CARE_PROVIDER_SITE_OTHER): Payer: Medicare Other | Admitting: Unknown Physician Specialty

## 2017-02-22 ENCOUNTER — Encounter: Payer: Self-pay | Admitting: Unknown Physician Specialty

## 2017-02-22 VITALS — BP 124/77 | HR 92 | Temp 98.2°F | Ht 65.5 in | Wt 175.2 lb

## 2017-02-22 VITALS — BP 124/77 | HR 92 | Temp 98.2°F | Resp 15 | Ht 65.5 in | Wt 175.2 lb

## 2017-02-22 DIAGNOSIS — E2839 Other primary ovarian failure: Secondary | ICD-10-CM | POA: Diagnosis not present

## 2017-02-22 DIAGNOSIS — Z7189 Other specified counseling: Secondary | ICD-10-CM | POA: Insufficient documentation

## 2017-02-22 DIAGNOSIS — Z114 Encounter for screening for human immunodeficiency virus [HIV]: Secondary | ICD-10-CM | POA: Diagnosis not present

## 2017-02-22 DIAGNOSIS — Z1159 Encounter for screening for other viral diseases: Secondary | ICD-10-CM

## 2017-02-22 DIAGNOSIS — R7301 Impaired fasting glucose: Secondary | ICD-10-CM | POA: Diagnosis not present

## 2017-02-22 DIAGNOSIS — K219 Gastro-esophageal reflux disease without esophagitis: Secondary | ICD-10-CM | POA: Diagnosis not present

## 2017-02-22 DIAGNOSIS — F332 Major depressive disorder, recurrent severe without psychotic features: Secondary | ICD-10-CM | POA: Diagnosis not present

## 2017-02-22 DIAGNOSIS — Z23 Encounter for immunization: Secondary | ICD-10-CM | POA: Diagnosis not present

## 2017-02-22 DIAGNOSIS — E781 Pure hyperglyceridemia: Secondary | ICD-10-CM

## 2017-02-22 DIAGNOSIS — Z0000 Encounter for general adult medical examination without abnormal findings: Secondary | ICD-10-CM

## 2017-02-22 MED ORDER — DULOXETINE HCL 60 MG PO CPEP: 60.0000 mg | ORAL_CAPSULE | Freq: Every day | ORAL | 3 refills | Status: DC

## 2017-02-22 MED ORDER — OMEPRAZOLE 20 MG PO CPDR: 20.0000 mg | DELAYED_RELEASE_CAPSULE | Freq: Every day | ORAL | 3 refills | Status: DC

## 2017-02-22 NOTE — Assessment & Plan Note (Addendum)
Stable, continue present medications.   

## 2017-02-22 NOTE — Patient Instructions (Addendum)
Pneumococcal Conjugate Vaccine (PCV13) What You Need to Know 1. Why get vaccinated? Vaccination can protect both children and adults from pneumococcal disease. Pneumococcal disease is caused by bacteria that can spread from person to person through close contact. It can cause ear infections, and it can also lead to more serious infections of the:  Lungs (pneumonia),  Blood (bacteremia), and  Covering of the brain and spinal cord (meningitis).  Pneumococcal pneumonia is most common among adults. Pneumococcal meningitis can cause deafness and brain damage, and it kills about 1 child in 10 who get it. Anyone can get pneumococcal disease, but children under 2 years of age and adults 65 years and older, people with certain medical conditions, and cigarette smokers are at the highest risk. Before there was a vaccine, the [place] saw:  more than 700 cases of meningitis,  about 13,000 blood infections,  about 5 million ear infections, and  about 200 deaths  in children under 5 each year from pneumococcal disease. Since vaccine became available, severe pneumococcal disease in these children has fallen by 88%. About 18,000 older adults die of pneumococcal disease each year in the [place]. Treatment of pneumococcal infections with penicillin and other drugs is not as effective as it used to be, because some strains of the disease have become resistant to these drugs. This makes prevention of the disease, through vaccination, even more important. 2. PCV13 vaccine Pneumococcal conjugate vaccine (called PCV13) protects against 13 types of pneumococcal bacteria. PCV13 is routinely given to children at 2, 4, 6, and 12-15 months of age. It is also recommended for children and adults 2 to 64 years of age with certain health conditions, and for all adults 65 years of age and older. Your doctor can give you details. 3. Some people should not get this vaccine Anyone who has ever had a  life-threatening allergic reaction to a dose of this vaccine, to an earlier pneumococcal vaccine called PCV7, or to any vaccine containing diphtheria toxoid (for example, DTaP), should not get PCV13. Anyone with a severe allergy to any component of PCV13 should not get the vaccine. Tell your doctor if the person being vaccinated has any severe allergies. If the person scheduled for vaccination is not feeling well, your healthcare provider might decide to reschedule the shot on another day. 4. Risks of a vaccine reaction With any medicine, including vaccines, there is a chance of reactions. These are usually mild and go away on their own, but serious reactions are also possible. Problems reported following PCV13 varied by age and dose in the series. The most common problems reported among children were:  About half became drowsy after the shot, had a temporary loss of appetite, or had redness or tenderness where the shot was given.  About 1 out of 3 had swelling where the shot was given.  About 1 out of 3 had a mild fever, and about 1 in 20 had a fever over 102.2F.  Up to about 8 out of 10 became fussy or irritable.  Adults have reported pain, redness, and swelling where the shot was given; also mild fever, fatigue, headache, chills, or muscle pain. Young children who get PCV13 along with inactivated flu vaccine at the same time may be at increased risk for seizures caused by fever. Ask your doctor for more information. Problems that could happen after any vaccine:  People sometimes faint after a medical procedure, including vaccination. Sitting or lying down for about 15 minutes can help prevent   fainting, and injuries caused by a fall. Tell your doctor if you feel dizzy, or have vision changes or ringing in the ears.  Some older children and adults get severe pain in the shoulder and have difficulty moving the arm where a shot was given. This happens very rarely.  Any medication can cause a  severe allergic reaction. Such reactions from a vaccine are very rare, estimated at about 1 in a million doses, and would happen within a few minutes to a few hours after the vaccination. As with any medicine, there is a very small chance of a vaccine causing a serious injury or death. The safety of vaccines is always being monitored. For more information, visit: http://www.aguilar.org/ 5. What if there is a serious reaction? What should I look for? Look for anything that concerns you, such as signs of a severe allergic reaction, very high fever, or unusual behavior. Signs of a severe allergic reaction can include hives, swelling of the face and throat, difficulty breathing, a fast heartbeat, dizziness, and weakness-usually within a few minutes to a few hours after the vaccination. What should I do?  If you think it is a severe allergic reaction or other emergency that can't wait, call 9-1-1 or get the person to the nearest hospital. Otherwise, call your doctor.  Reactions should be reported to the Vaccine Adverse Event Reporting System (VAERS). Your doctor should file this report, or you can do it yourself through the VAERS web site at www.vaers.SamedayNews.es, or by calling (979)618-0247. ? VAERS does not give medical advice. 6. The National Vaccine Injury Compensation Program The [employer] Vaccine Injury Compensation Program (VICP) is a federal program that was created to compensate people who may have been injured by certain vaccines. Persons who believe they may have been injured by a vaccine can learn about the program and about filing a claim by calling 904-030-0316 or visiting the [place] website at GoldCloset.com.ee. There is a time limit to file a claim for compensation. 7. How can I learn more?  Ask your healthcare provider. He or she can give you the vaccine package insert or suggest other sources of information.  Call your local or state health department.  Contact the  Centers for Disease Control and Prevention (CDC): ? Call 838-409-3702 (1-800-CDC-INFO) or ? Visit CDC's website at http://hunter.com/ Vaccine Information Statement, PCV13 Vaccine (12/17/2013) This information is not intended to replace advice given to you by your health care provider. Make sure you discuss any questions you have with your health care provider. ------------------------------------------------------  Doctor's Making House Calls --------------------------------------------------------------- 10 REFLUX RULES TO LIVE BY---  1. Limit liquids to no more than 6oz/hour. (A liquid includes smoothies, yogurt, soup, ice cream, etc).   2. STOP ALL LIQUIDS 3 hours before bed.   3. Stop all solid food 4 hours before bedtime.   4. Move bedtimes meds to evening meal (except for sleeping medications).   5. High fat foods slow gastric movement, if you are going to eat something with high fat, eat it for lunch, and not dinner.   6. Sleep elevated, preferably on an adjustable bed with upper body elevated between 30-45 degree and the knees slightly elevated and flexed. (Sleep on a few pillows is not nearly enough!).   7. NEVER sleep on your right side or stomach-- switch sides of the bed if necessary so you wont sleep on your right side.   8. Avoid these products as tey ALL will make silent reflux worse for several hours: Alcohol,  caffeine, carobated beverages, chocolate, fatty foods, tomato sauces.   9. Remember that there is NO medication that stops reflux! Medications only lower the acid content in the stomach juices.   10. Only CARBOHYDRATES, GRAINS, AND STARCHES absorb the stomach liquids so they will not easily reflux high enough to be aspirated.

## 2017-02-22 NOTE — Assessment & Plan Note (Signed)
Check lipid panel today.  Pt lost 26 pounds due to lifestyle changes

## 2017-02-22 NOTE — Assessment & Plan Note (Signed)
A voluntary discussion about advance care planning including the explanation and discussion of advance directives was extensively discussed  with the patient.  Explanation about the health care proxy and Living will was reviewed and packet with forms with explanation of how to fill them out was given.  During this discussion, the patient was able to identify a health care proxy as her sister and plans to fill out the paperwork required.  Patient was offered a separate [place] visit for further assistance with forms.

## 2017-02-22 NOTE — Patient Instructions (Signed)
[name] , Thank you for taking time to come for your Medicare Wellness Visit. I appreciate your ongoing commitment to your health goals. Please review the following plan we discussed and let me know if I can assist you in the future.   Screening recommendations/referrals: Colonoscopy: completed 09/17/2016 Mammogram: completed 02/24/2016 Please call [phone] to schedule your mammogram.  Bone Density: due now Please call [phone] to schedule  Recommended yearly ophthalmology/optometry visit for glaucoma screening and checkup Recommended yearly dental visit for hygiene and checkup  Vaccinations: Influenza vaccine: up to date   Pneumococcal vaccine: up to date  Tdap vaccine: up to date Shingles vaccine: up to date   Advanced directives: Advance directive discussed with you today. I have provided a copy for you to complete at home and have notarized. Once this is complete please bring a copy in to our office so we can scan it into your chart.  Conditions/risks identified: Recommend drinking at least 6-8 glasses of water a day  Next appointment: Follow up in one year for your annual wellness exam.    Preventive Care 65 Years and Older, Female Preventive care refers to lifestyle choices and visits with your health care provider that can promote health and wellness. What does preventive care include?  A yearly physical exam. This is also called an annual well check.  Dental exams once or twice a year.  Routine eye exams. Ask your health care provider how often you should have your eyes checked.  Personal lifestyle choices, including:  Daily care of your teeth and gums.  Regular physical activity.  Eating a healthy diet.  Avoiding tobacco and drug use.  Limiting alcohol use.  Practicing safe sex.  Taking low-dose aspirin every day.  Taking vitamin and mineral supplements as recommended by your health care provider. What happens during an annual well check? The  services and screenings done by your health care provider during your annual well check will depend on your age, overall health, lifestyle risk factors, and family history of disease. Counseling  Your health care provider may ask you questions about your:  Alcohol use.  Tobacco use.  Drug use.  Emotional well-being.  Home and relationship well-being.  Sexual activity.  Eating habits.  History of falls.  Memory and ability to understand (cognition).  Work and work Statistician.  Reproductive health. Screening  You may have the following tests or measurements:  Height, weight, and BMI.  Blood pressure.  Lipid and cholesterol levels. These may be checked every 5 years, or more frequently if you are over 79 years old.  Skin check.  Lung cancer screening. You may have this screening every year starting at age 25 if you have a 30-pack-year history of smoking and currently smoke or have quit within the past 15 years.  Fecal occult blood test (FOBT) of the stool. You may have this test every year starting at age 75.  Flexible sigmoidoscopy or colonoscopy. You may have a sigmoidoscopy every 5 years or a colonoscopy every 10 years starting at age 42.  Hepatitis C blood test.  Hepatitis B blood test.  Sexually transmitted disease (STD) testing.  Diabetes screening. This is done by checking your blood sugar (glucose) after you have not eaten for a while (fasting). You may have this done every 1-3 years.  Bone density scan. This is done to screen for osteoporosis. You may have this done starting at age 53.  Mammogram. This may be done every 1-2 years. Talk to your health  care provider about how often you should have regular mammograms. Talk with your health care provider about your test results, treatment options, and if necessary, the need for more tests. Vaccines  Your health care provider may recommend certain vaccines, such as:  Influenza vaccine. This is recommended  every year.  Tetanus, diphtheria, and acellular pertussis (Tdap, Td) vaccine. You may need a Td booster every 10 years.  Zoster vaccine. You may need this after age 21.  Pneumococcal 13-valent conjugate (PCV13) vaccine. One dose is recommended after age 68.  Pneumococcal polysaccharide (PPSV23) vaccine. One dose is recommended after age 46. Talk to your health care provider about which screenings and vaccines you need and how often you need them. This information is not intended to replace advice given to you by your health care provider. Make sure you discuss any questions you have with your health care provider. Document Released: 02/25/2015 Document Revised: 10/19/2015 Document Reviewed: 11/30/2014 Elsevier Interactive Patient Education  2017 [place] Prevention in the Home Falls can cause injuries. They can happen to people of all ages. There are many things you can do to make your home safe and to help prevent falls. What can I do on the outside of my home?  Regularly fix the edges of walkways and driveways and fix any cracks.  Remove anything that might make you trip as you walk through a door, such as a raised step or threshold.  Trim any bushes or trees on the path to your home.  Use bright outdoor lighting.  Clear any walking paths of anything that might make someone trip, such as rocks or tools.  Regularly check to see if handrails are loose or broken. Make sure that both sides of any steps have handrails.  Any raised decks and porches should have guardrails on the edges.  Have any leaves, snow, or ice cleared regularly.  Use sand or salt on walking paths during winter.  Clean up any spills in your garage right away. This includes oil or grease spills. What can I do in the bathroom?  Use night lights.  Install grab bars by the toilet and in the tub and shower. Do not use towel bars as grab bars.  Use non-skid mats or decals in the tub or shower.  If  you need to sit down in the shower, use a plastic, non-slip stool.  Keep the floor dry. Clean up any water that spills on the floor as soon as it happens.  Remove soap buildup in the tub or shower regularly.  Attach bath mats securely with double-sided non-slip rug tape.  Do not have throw rugs and other things on the floor that can make you trip. What can I do in the bedroom?  Use night lights.  Make sure that you have a light by your bed that is easy to reach.  Do not use any sheets or blankets that are too big for your bed. They should not hang down onto the floor.  Have a firm chair that has side arms. You can use this for support while you get dressed.  Do not have throw rugs and other things on the floor that can make you trip. What can I do in the kitchen?  Clean up any spills right away.  Avoid walking on wet floors.  Keep items that you use a lot in easy-to-reach places.  If you need to reach something above you, use a strong step stool that has a grab  bar.  Keep electrical cords out of the way.  Do not use floor polish or wax that makes floors slippery. If you must use wax, use non-skid floor wax.  Do not have throw rugs and other things on the floor that can make you trip. What can I do with my stairs?  Do not leave any items on the stairs.  Make sure that there are handrails on both sides of the stairs and use them. Fix handrails that are broken or loose. Make sure that handrails are as long as the stairways.  Check any carpeting to make sure that it is firmly attached to the stairs. Fix any carpet that is loose or worn.  Avoid having throw rugs at the top or bottom of the stairs. If you do have throw rugs, attach them to the floor with carpet tape.  Make sure that you have a light switch at the top of the stairs and the bottom of the stairs. If you do not have them, ask someone to add them for you. What else can I do to help prevent falls?  Wear shoes  that:  Do not have high heels.  Have rubber bottoms.  Are comfortable and fit you well.  Are closed at the toe. Do not wear sandals.  If you use a stepladder:  Make sure that it is fully opened. Do not climb a closed stepladder.  Make sure that both sides of the stepladder are locked into place.  Ask someone to hold it for you, if possible.  Clearly mark and make sure that you can see:  Any grab bars or handrails.  First and last steps.  Where the edge of each step is.  Use tools that help you move around (mobility aids) if they are needed. These include:  Canes.  Walkers.  Scooters.  Crutches.  Turn on the lights when you go into a dark area. Replace any light bulbs as soon as they burn out.  Set up your furniture so you have a clear path. Avoid moving your furniture around.  If any of your floors are uneven, fix them.  If there are any pets around you, be aware of where they are.  Review your medicines with your doctor. Some medicines can make you feel dizzy. This can increase your chance of falling. Ask your doctor what other things that you can do to help prevent falls. This information is not intended to replace advice given to you by your health care provider. Make sure you discuss any questions you have with your health care provider. Document Released: 11/25/2008 Document Revised: 07/07/2015 Document Reviewed: 03/05/2014 Elsevier Interactive Patient Education  2017 [employer].

## 2017-02-22 NOTE — Assessment & Plan Note (Addendum)
Pt ed on reflux.  Start Omeprazole to take prn.

## 2017-02-22 NOTE — Assessment & Plan Note (Signed)
Lost weight through fit bit

## 2017-02-22 NOTE — Progress Notes (Signed)
Subjective:   Marie Perry is a 67 y.o. female who presents for an Initial Medicare Annual Wellness Visit.  Review of Systems     Cardiac Risk Factors include: hypertension;advanced age (>72men, >71 women)     Objective:    Today's Vitals   02/22/17 0958  BP: 124/77  Pulse: 92  Resp: 15  Temp: 98.2 F (36.8 C)  TempSrc: Oral  SpO2: 98%  Weight: 175 lb 3.2 oz (79.5 kg)  Height: 5' 5.5" (1.664 m)   Body mass index is 28.71 kg/m.  Advanced Directives 02/22/2017 09/17/2016  Does Patient Have a Medical Advance Directive? No No  Would patient like information on creating a medical advance directive? Yes (MAU/Ambulatory/Procedural Areas - Information given) -    Current Medications (verified) Outpatient Encounter Medications as of 02/22/2017  Medication Sig  . Cholecalciferol (VITAMIN D PO) Take by mouth daily.  . Cyanocobalamin (B-12 PO) Take by mouth daily.  . DULoxetine (CYMBALTA) 60 MG capsule Take 1 capsule (60 mg total) by mouth daily.  [name] omeprazole (PRILOSEC) 20 MG capsule Take 1 capsule (20 mg total) by mouth daily.  . [DISCONTINUED] DULoxetine (CYMBALTA) 60 MG capsule TAKE 1 CAPSULE BY MOUTH EVERY DAY.   No facility-administered encounter medications on file as of 02/22/2017.     Allergies (verified) Patient has no known allergies.   History: Past Medical History:  Diagnosis Date  . Allergy   . Anxiety   . Depression   . GERD (gastroesophageal reflux disease)   . IBS (irritable bowel syndrome)   . IFG (impaired fasting glucose)   . Osteopenia   . Sleep apnea   . Vitamin D deficiency    Past Surgical History:  Procedure Laterality Date  . bright's surgery    . COLONOSCOPY N/A 09/17/2016   Procedure: COLONOSCOPY;  Surgeon: Lollie Sails, MD;  Location: [facility] ENDOSCOPY;  Service: Endoscopy;  Laterality: N/A;  . SKIN TAG REMOVAL     rectal   Family History  Problem Relation Age of Onset  . Mental illness Mother   . Depression Mother   . Stroke  Father   . Heart disease Father   . Cancer Maternal Grandmother        ovarian  . Anemia Maternal Grandmother   . Lung disease Maternal Grandfather   . Birth defects Maternal Grandfather   . Depression Sister   . Fibromyalgia Brother   . GER disease Brother   . Heart disease Paternal Grandmother   . Birth defects Paternal Grandfather   . Depression Brother   . Breast cancer Neg Hx    Social History   Socioeconomic History  . Marital status: Single    Spouse name: None  . Number of children: None  . Years of education: None  . Highest education level: None  Social Needs  . Financial resource strain: Not very hard  . Food insecurity - worry: Never true  . Food insecurity - inability: Never true  . Transportation needs - medical: No  . Transportation needs - non-medical: No  Occupational History  . None  Tobacco Use  . Smoking status: Never Smoker  . Smokeless tobacco: Never Used  Substance and Sexual Activity  . Alcohol use: Yes    Alcohol/week: 0.0 oz    Comment: on occasion  . Drug use: No  . Sexual activity: No  Other Topics Concern  . None  Social History Narrative  . None    Tobacco Counseling Counseling given: Not Answered  Clinical Intake:  Pre-visit preparation completed: No  Pain : No/denies pain     Nutritional Status: BMI 25 -29 Overweight Nutritional Risks: None Diabetes: No  How often do you need to have someone help you when you read instructions, pamphlets, or other written materials from your doctor or pharmacy?: 1 - Never What is the last grade level you completed in school?: college   Interpreter Needed?: No  Information entered by :: Teagen Mcleary,LPN   Activities of Daily Living In your present state of health, do you have any difficulty performing the following activities: 02/22/2017 02/22/2017  Hearing? Tempie Donning  Vision? N N  Difficulty concentrating or making decisions? Y -  Walking or climbing stairs? N N  Dressing or bathing?  N N  Doing errands, shopping? N N  Preparing Food and eating ? N -  Using the Toilet? N -  In the past six months, have you accidently leaked urine? N -  Do you have problems with loss of bowel control? N -  Managing your Medications? N -  Managing your Finances? N -  Housekeeping or managing your Housekeeping? N -  Some recent data might be hidden     Immunizations and Health Maintenance Immunization History  Administered Date(s) Administered  . Influenza-Unspecified 11/22/2014, 12/12/2015, 01/07/2017  . Pneumococcal Conjugate-13 02/22/2017  . Tdap 10/07/2015  . Zoster 03/06/2013   Health Maintenance Due  Topic Date Due  . DEXA SCAN  03/07/2016    Patient Care Team: Kathrine Haddock, NP as PCP - General (Nurse Practitioner)  Indicate any recent Medical Services you may have received from other than [facility] providers in the past year (date may be approximate).     Assessment:   This is a routine wellness examination for Marie Perry.  Hearing/Vision screen Vision Screening Comments: Sees Dr.Woodard annually  Dietary issues and exercise activities discussed: Current Exercise Habits: The patient does not participate in regular exercise at present, Exercise limited by: None identified  Goals    . DIET - INCREASE WATER INTAKE     Recommend drinking at least 6-8 glasses of water a day      Depression Screen PHQ 2/9 Scores 02/22/2017 02/22/2017 04/03/2016 10/07/2015  PHQ - 2 Score 2 2 1 3   PHQ- 9 Score 5 5 - 12    Fall Risk Fall Risk  02/22/2017 02/22/2017 04/03/2016  Falls in the past year? No No No    Is the patient's home free of loose throw rugs in walkways, pet beds, electrical cords, etc?   yes      Grab bars in the bathroom? yes      Handrails on the stairs?   no      Adequate lighting?   no  Timed Get Up and Go Performed completed in 8 seconds with no use of assistive devices. Steady gait. No intervention needed at this   Cognitive Function:     6CIT Screen 02/22/2017    What Year? 0 points  What month? 0 points  What time? 0 points  Count back from 20 0 points  Months in reverse 0 points  Repeat phrase 0 points  Total Score 0    Screening Tests Health Maintenance  Topic Date Due  . DEXA SCAN  03/07/2016  . Hepatitis C Screening  02/22/2018 (Originally 1950-12-08)  . HIV Screening  02/22/2018 (Originally 03/07/1965)  . PNA vac Low Risk Adult (2 of 2 - PPSV23) 02/22/2018  . MAMMOGRAM  02/23/2018  . PAP SMEAR  10/07/2018  . TETANUS/TDAP  10/06/2025  . COLONOSCOPY  09/18/2026  . INFLUENZA VACCINE  Completed    Qualifies for Shingles Vaccine? Up to date  Cancer Screenings: Lung: Low Dose CT Chest recommended if Age 89-80 years, 30 pack-year currently smoking OR have quit w/in 15years. Patient does not qualify. Breast: Up to date on Mammogram? Yes   Up to date of Bone Density/Dexa? No Colorectal: completed 09/17/2016  Additional Screenings:  Hepatitis B/HIV/Syphillis:done today Hepatitis C Screening: done today      Plan:    I have personally reviewed and addressed the Medicare Annual Wellness questionnaire and have noted the following in the patient's chart:  A. Medical and social history B. Use of alcohol, tobacco or illicit drugs  C. Current medications and supplements D. Functional ability and status E.  Nutritional status F.  Physical activity G. Advance directives H. List of other physicians I.  Hospitalizations, surgeries, and ER visits in previous 12 months J.  [place] such as hearing and vision if needed, cognitive and depression L. Referrals and appointments   In addition, I have reviewed and discussed with patient certain preventive protocols, quality metrics, and best practice recommendations. A written personalized care plan for preventive services as well as general preventive health recommendations were provided to patient.   Signed,  Tyler Aas, LPN Nurse Health Advisor   Nurse Notes: none

## 2017-02-22 NOTE — Progress Notes (Signed)
BP 124/77   Pulse 92   Temp 98.2 F (36.8 C) (Oral)   Ht 5' 5.5" (1.664 m)   Wt 175 lb 3.2 oz (79.5 kg)   LMP  (LMP Unknown)   SpO2 98%   BMI 28.71 kg/m    Subjective:    Patient ID: Marie Perry, female    DOB: 1950/11/09, 67 y.o.   MRN: [id number]  HPI: Marie Perry is a 67 y.o. female  Chief Complaint  Patient presents with  . Annual Exam    pt having wellness exam after appointment with Malachy Mood   Depression Pt states she is doing well.  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 with her Depression screen [facility] 2/9 02/22/2017 02/22/2017 04/03/2016 10/07/2015  Decreased Interest 1 1 0 2  Down, Depressed, Hopeless 1 1 1 1   PHQ - 2 Score 2 2 1 3   Altered sleeping 1 1 - 2  Tired, decreased energy 1 1 - 2  Change in appetite 0 0 - 1  Feeling bad or failure about yourself  0 0 - 2  Trouble concentrating 1 1 - 2  Moving slowly or fidgety/restless 0 0 - 0  Suicidal thoughts 0 0 - 0  PHQ-9 Score 5 5 - 12  Difficult doing work/chores Not difficult at all - - -   Sleep apnea Uses CPAP  GERD Having trouble with heartburn and waking up with chest pain..  Interested in trying Omeprazole.   Relevant past medical, surgical, family and social history reviewed and updated as indicated. Interim medical history since our last visit reviewed. Allergies and medications reviewed and updated.  Review of Systems  Per HPI unless specifically indicated above     Objective:    BP 124/77   Pulse 92   Temp 98.2 F (36.8 C) (Oral)   Ht 5' 5.5" (1.664 m)   Wt 175 lb 3.2 oz (79.5 kg)   LMP  (LMP Unknown)   SpO2 98%   BMI 28.71 kg/m   Wt Readings from Last 3 Encounters:  02/22/17 175 lb 3.2 oz (79.5 kg)  09/17/16 178 lb (80.7 kg)  04/03/16 199 lb 12.8 oz (90.6 kg)    Physical Exam  Constitutional: She is oriented to person, place, and time. She appears well-developed and well-nourished. No distress.  HENT:  Head: Normocephalic and atraumatic.  Eyes: Conjunctivae and lids are normal.  Pupils are equal, round, and reactive to light. Right eye exhibits no discharge. Left eye exhibits no discharge. No scleral icterus.  Neck: Normal range of motion. Neck supple. No JVD present. Carotid bruit is not present. No thyromegaly present.  Cardiovascular: Normal rate, regular rhythm and normal heart sounds. Exam reveals no gallop and no friction rub.  No murmur heard. Pulmonary/Chest: Effort normal and breath sounds normal. No respiratory distress. She has no wheezes. She has no rales.  Abdominal: Soft. Normal appearance and bowel sounds are normal. There is no splenomegaly or hepatomegaly. There is no tenderness. There is no rebound.  Genitourinary: No breast swelling, tenderness or discharge.  Musculoskeletal: Normal range of motion.  Lymphadenopathy:    She has no cervical adenopathy.  Neurological: She is alert and oriented to person, place, and time.  Skin: Skin is warm, dry and intact. No rash noted. No pallor.  Psychiatric: She has a normal mood and affect. Her speech is normal and behavior is normal. Judgment and thought content normal. Cognition and memory are normal.    Results for orders placed or performed in visit  on 10/07/15  CBC with Differential/Platelet  Result Value Ref Range   WBC 8.3 3.4 - 10.8 x10E3/uL   RBC 5.04 3.77 - 5.28 x10E6/uL   Hemoglobin 14.5 11.1 - 15.9 g/dL   Hematocrit 44.0 34.0 - 46.6 %   MCV 87 79 - 97 fL   MCH 28.8 26.6 - 33.0 pg   MCHC 33.0 31.5 - 35.7 g/dL   RDW 14.1 12.3 - 15.4 %   Platelets 233 150 - 379 x10E3/uL   Neutrophils 62 %   Lymphs 29 %   Monocytes 7 %   Eos 2 %   Basos 0 %   Neutrophils Absolute 5.1 1.4 - 7.0 x10E3/uL   Lymphocytes Absolute 2.4 0.7 - 3.1 x10E3/uL   Monocytes Absolute 0.6 0.1 - 0.9 x10E3/uL   EOS (ABSOLUTE) 0.2 0.0 - 0.4 x10E3/uL   Basophils Absolute 0.0 0.0 - 0.2 x10E3/uL   Immature Granulocytes 0 %   Immature Grans (Abs) 0.0 0.0 - 0.1 x10E3/uL  Comprehensive metabolic panel  Result Value Ref Range    Glucose 83 65 - 99 mg/dL   BUN 21 8 - 27 mg/dL   Creatinine, Ser 0.85 0.57 - 1.00 mg/dL   GFR calc non Af Amer 73 >59 mL/min/1.73   GFR calc Af Amer 84 >59 mL/min/1.73   BUN/Creatinine Ratio 25 12 - 28   Sodium 142 134 - 144 mmol/L   Potassium 4.4 3.5 - 5.2 mmol/L   Chloride 102 96 - 106 mmol/L   CO2 24 18 - 29 mmol/L   Calcium 10.5 (H) 8.7 - 10.3 mg/dL   Total Protein 7.1 6.0 - 8.5 g/dL   Albumin 4.6 3.6 - 4.8 g/dL   Globulin, Total 2.5 1.5 - 4.5 g/dL   Albumin/Globulin Ratio 1.8 1.2 - 2.2   Bilirubin Total 0.4 0.0 - 1.2 mg/dL   Alkaline Phosphatase 95 39 - 117 IU/L   AST 20 0 - 40 IU/L   ALT 21 0 - 32 IU/L  Lipid Panel w/o Chol/HDL Ratio  Result Value Ref Range   Cholesterol, Total 232 (H) 100 - 199 mg/dL   Triglycerides 403 (H) 0 - 149 mg/dL   HDL 44 >39 mg/dL   VLDL Cholesterol Cal Comment 5 - 40 mg/dL   LDL Calculated Comment 0 - 99 mg/dL  TSH  Result Value Ref Range   TSH 1.890 0.450 - 4.500 uIU/mL  IGP, Aptima HPV, rfx 16/18,45  Result Value Ref Range   DIAGNOSIS: Comment    Specimen adequacy: Comment    Clinician Provided ICD10 Comment    Performed by: Comment    PAP Smear Comment .    Note: Comment    Test Methodology Comment    HPV Aptima Negative Negative      Assessment & Plan:   Problem List Items Addressed This Visit      Unprioritized   Advanced care planning/counseling discussion    A voluntary discussion about advance care planning including the explanation and discussion of advance directives was extensively discussed  with the patient.  Explanation about the health care proxy and Living will was reviewed and packet with forms with explanation of how to fill them out was given.  During this discussion, the patient was able to identify a health care proxy as her sister and plans to fill out the paperwork required.  Patient was offered a separate [place] visit for further assistance with forms.         GERD (gastroesophageal reflux  disease)    Pt ed on reflux.  Start Omeprazole to take prn.        Relevant Medications   omeprazole (PRILOSEC) 20 MG capsule   Hypertriglyceridemia    Check lipid panel today.  Pt lost 26 pounds due to lifestyle changes      Relevant Orders   Lipid Panel w/o Chol/HDL Ratio   IFG (impaired fasting glucose)    Lost weight through fit bit      Relevant Orders   Comprehensive metabolic panel   Recurrent major depression-severe (HCC)    Stable, continue present medications.        Relevant Medications   DULoxetine (CYMBALTA) 60 MG capsule    Other Visit Diagnoses    Need for pneumococcal vaccination    -  Primary   Relevant Orders   Pneumococcal conjugate vaccine 13-valent IM (Completed)   Ovarian failure       Relevant Orders   DG Bone Density       Follow up plan: Return in about 6 months (around 08/22/2017).

## 2017-02-23 LAB — COMPREHENSIVE METABOLIC PANEL
A/G RATIO: 2 (ref 1.2–2.2)
ALT: 11 IU/L (ref 0–32)
AST: 15 IU/L (ref 0–40)
Albumin: 4.7 g/dL (ref 3.6–4.8)
Alkaline Phosphatase: 89 IU/L (ref 39–117)
BUN/Creatinine Ratio: 24 (ref 12–28)
BUN: 20 mg/dL (ref 8–27)
Bilirubin Total: 0.5 mg/dL (ref 0.0–1.2)
CALCIUM: 9.8 mg/dL (ref 8.7–10.3)
CO2: 25 mmol/L (ref 20–29)
Chloride: 104 mmol/L (ref 96–106)
Creatinine, Ser: 0.83 mg/dL (ref 0.57–1.00)
GFR, EST AFRICAN AMERICAN: 86 mL/min/{1.73_m2} (ref >59)
GFR, EST NON AFRICAN AMERICAN: 74 mL/min/{1.73_m2} (ref >59)
GLOBULIN, TOTAL: 2.4 g/dL (ref 1.5–4.5)
Glucose: 88 mg/dL (ref 65–99)
POTASSIUM: 4.2 mmol/L (ref 3.5–5.2)
Sodium: 144 mmol/L (ref 134–144)
Total Protein: 7.1 g/dL (ref 6.0–8.5)

## 2017-02-23 LAB — LIPID PANEL W/O CHOL/HDL RATIO
Cholesterol, Total: 215 mg/dL — ABNORMAL HIGH (ref 100–199)
HDL: 53 mg/dL (ref >39)
LDL Calculated: 134 mg/dL — ABNORMAL HIGH (ref 0–99)
Triglycerides: 138 mg/dL (ref 0–149)
VLDL Cholesterol Cal: 28 mg/dL (ref 5–40)

## 2017-02-23 LAB — HEPATITIS C ANTIBODY: Hep C Virus Ab: <0.1 s/co ratio (ref 0.0–0.9)

## 2017-02-23 LAB — HIV ANTIBODY (ROUTINE TESTING W REFLEX): HIV SCREEN 4TH GENERATION: NONREACTIVE

## 2017-02-25 ENCOUNTER — Encounter: Payer: Self-pay | Admitting: Unknown Physician Specialty

## 2017-02-25 NOTE — Progress Notes (Signed)
Notified pt by mychart

## 2017-03-19 ENCOUNTER — Other Ambulatory Visit: Payer: Self-pay | Admitting: Unknown Physician Specialty

## 2017-03-19 DIAGNOSIS — Z1231 Encounter for screening mammogram for malignant neoplasm of breast: Secondary | ICD-10-CM

## 2017-03-20 ENCOUNTER — Telehealth: Payer: Self-pay | Admitting: Unknown Physician Specialty

## 2017-03-20 NOTE — Telephone Encounter (Signed)
Hey [place], Have you referred this patient to home health? Tiffany H. Entered in a referral to [facility] but there's not one for home health.

## 2017-03-20 NOTE — Telephone Encounter (Signed)
Looked through patient's OV with Malachy Mood and with Tiffany. Do not see any documentation on a home health referral. [facility] referral was entered by Tiffany for respite care. Tiffany, can you check on the status of this referral please?

## 2017-03-20 NOTE — Telephone Encounter (Signed)
Copied from [place] [id number]. Topic: General - Other >> Mar 20, 2017  1:56 PM Marie Perry, [place] wrote: Reason for CRM: pt was calling back to check on her referral for home care she stated that she has not heard anything and would like to know what's going Please call her back at [phone]

## 2017-04-09 DIAGNOSIS — H6062 Unspecified chronic otitis externa, left ear: Secondary | ICD-10-CM | POA: Diagnosis not present

## 2017-04-09 DIAGNOSIS — H903 Sensorineural hearing loss, bilateral: Secondary | ICD-10-CM | POA: Diagnosis not present

## 2017-04-11 ENCOUNTER — Ambulatory Visit
Admission: RE | Admit: 2017-04-11 | Discharge: 2017-04-11 | Disposition: A | Discharge status: Home / Self Care | Payer: Medicare Other | Source: Ambulatory Visit | Attending: Unknown Physician Specialty | Admitting: Unknown Physician Specialty

## 2017-04-11 DIAGNOSIS — Z1231 Encounter for screening mammogram for malignant neoplasm of breast: Secondary | ICD-10-CM | POA: Diagnosis not present

## 2017-04-11 DIAGNOSIS — Z780 Asymptomatic menopausal state: Secondary | ICD-10-CM | POA: Diagnosis not present

## 2017-04-11 DIAGNOSIS — E2839 Other primary ovarian failure: Secondary | ICD-10-CM

## 2017-04-11 DIAGNOSIS — M810 Age-related osteoporosis without current pathological fracture: Secondary | ICD-10-CM | POA: Diagnosis not present

## 2017-04-12 NOTE — Progress Notes (Signed)
Notified pt by mychart

## 2017-04-15 ENCOUNTER — Telehealth: Payer: Self-pay | Admitting: Unknown Physician Specialty

## 2017-04-15 ENCOUNTER — Encounter: Payer: Self-pay | Admitting: Certified Registered Nurse Anesthetist

## 2017-04-15 ENCOUNTER — Ambulatory Visit
Admission: RE | Admit: 2017-04-15 | Discharge: 2017-04-15 | Disposition: A | Discharge status: Home / Self Care | Payer: Medicare Other | Source: Ambulatory Visit | Attending: Gastroenterology | Admitting: Gastroenterology

## 2017-04-15 ENCOUNTER — Ambulatory Visit: Payer: Medicare Other | Admitting: Certified Registered Nurse Anesthetist

## 2017-04-15 ENCOUNTER — Encounter
Admission: RE | Disposition: A | Discharge status: Home / Self Care | Payer: Self-pay | Source: Ambulatory Visit | Attending: Gastroenterology

## 2017-04-15 DIAGNOSIS — Z1211 Encounter for screening for malignant neoplasm of colon: Secondary | ICD-10-CM | POA: Diagnosis not present

## 2017-04-15 DIAGNOSIS — M8580 Other specified disorders of bone density and structure, unspecified site: Secondary | ICD-10-CM | POA: Insufficient documentation

## 2017-04-15 DIAGNOSIS — F329 Major depressive disorder, single episode, unspecified: Secondary | ICD-10-CM | POA: Insufficient documentation

## 2017-04-15 DIAGNOSIS — K589 Irritable bowel syndrome without diarrhea: Secondary | ICD-10-CM | POA: Diagnosis not present

## 2017-04-15 DIAGNOSIS — K219 Gastro-esophageal reflux disease without esophagitis: Secondary | ICD-10-CM | POA: Insufficient documentation

## 2017-04-15 DIAGNOSIS — K5790 Diverticulosis of intestine, part unspecified, without perforation or abscess without bleeding: Secondary | ICD-10-CM | POA: Diagnosis not present

## 2017-04-15 DIAGNOSIS — Z79899 Other long term (current) drug therapy: Secondary | ICD-10-CM | POA: Insufficient documentation

## 2017-04-15 DIAGNOSIS — E559 Vitamin D deficiency, unspecified: Secondary | ICD-10-CM | POA: Diagnosis not present

## 2017-04-15 DIAGNOSIS — F419 Anxiety disorder, unspecified: Secondary | ICD-10-CM | POA: Insufficient documentation

## 2017-04-15 DIAGNOSIS — G4730 Sleep apnea, unspecified: Secondary | ICD-10-CM | POA: Insufficient documentation

## 2017-04-15 DIAGNOSIS — Z86010 Personal history of colonic polyps: Secondary | ICD-10-CM | POA: Insufficient documentation

## 2017-04-15 DIAGNOSIS — K6389 Other specified diseases of intestine: Secondary | ICD-10-CM | POA: Diagnosis not present

## 2017-04-15 DIAGNOSIS — K5730 Diverticulosis of large intestine without perforation or abscess without bleeding: Secondary | ICD-10-CM | POA: Diagnosis not present

## 2017-04-15 DIAGNOSIS — M810 Age-related osteoporosis without current pathological fracture: Secondary | ICD-10-CM | POA: Insufficient documentation

## 2017-04-15 DIAGNOSIS — F418 Other specified anxiety disorders: Secondary | ICD-10-CM | POA: Diagnosis not present

## 2017-04-15 HISTORY — PX: COLONOSCOPY WITH PROPOFOL: SHX5780

## 2017-04-15 SURGERY — COLONOSCOPY WITH PROPOFOL
Anesthesia: General

## 2017-04-15 MED ORDER — PROPOFOL 500 MG/50ML IV EMUL
INTRAVENOUS | Status: DC | PRN
  Administered 2017-04-15: 140 ug/kg/min via INTRAVENOUS

## 2017-04-15 MED ORDER — PROPOFOL 10 MG/ML IV BOLUS
INTRAVENOUS | Status: DC | PRN
  Administered 2017-04-15: 100 mg via INTRAVENOUS
  Administered 2017-04-15 (×2): 20 mg via INTRAVENOUS

## 2017-04-15 MED ORDER — SODIUM CHLORIDE 0.9 % IV SOLN
INTRAVENOUS | Status: DC
  Administered 2017-04-15: 1000 mL via INTRAVENOUS

## 2017-04-15 MED ORDER — IBANDRONATE SODIUM 150 MG PO TABS: 150.0000 mg | ORAL_TABLET | ORAL | 4 refills | Status: DC

## 2017-04-15 MED ORDER — LIDOCAINE HCL (PF) 2 % IJ SOLN
INTRAMUSCULAR | Status: AC
  Filled 2017-04-15: qty 10

## 2017-04-15 MED ORDER — PROPOFOL 500 MG/50ML IV EMUL
INTRAVENOUS | Status: AC
  Filled 2017-04-15: qty 50

## 2017-04-15 MED ORDER — LIDOCAINE HCL (CARDIAC) 20 MG/ML IV SOLN
INTRAVENOUS | Status: DC | PRN
  Administered 2017-04-15: 50 mg via INTRAVENOUS

## 2017-04-15 NOTE — Anesthesia Preprocedure Evaluation (Signed)
Anesthesia Evaluation  Patient identified by MRN, date of birth, ID band Patient awake    Reviewed: Allergy & Precautions, H&P , NPO status , [name] Chart, lab work & pertinent test results  Airway Mallampati: III  TM Distance: >3 FB Neck ROM: limited    Dental  (+) Poor Dentition   Pulmonary sleep apnea ,           Cardiovascular Exercise Tolerance: Good (-) Past MI and (-) DOE negative cardio ROS       Neuro/Psych PSYCHIATRIC DISORDERS negative neurological ROS     GI/Hepatic Neg liver ROS, GERD  Medicated and Controlled,  Endo/Other  negative endocrine ROS  Renal/GU negative Renal ROS  negative genitourinary   Musculoskeletal   Abdominal   Peds  Hematology negative hematology ROS (+)   Anesthesia Other Findings Past Medical History: No date: Allergy No date: Anxiety No date: Depression No date: GERD (gastroesophageal reflux disease) No date: IBS (irritable bowel syndrome) No date: IFG (impaired fasting glucose) No date: Osteopenia No date: Sleep apnea No date: Vitamin D deficiency  Past Surgical History: No date: bright's surgery No date: SKIN TAG REMOVAL     Comment:  rectal  BMI    Body Mass Index:  27.88 kg/m      Reproductive/Obstetrics negative OB ROS                             Anesthesia Physical  Anesthesia Plan  ASA: III  Anesthesia Plan: General   Post-op Pain Management:    Induction: Intravenous  PONV Risk Score and Plan: 3 and Propofol infusion  Airway Management Planned: Natural Airway and Nasal Cannula  Additional Equipment:   Intra-op Plan:   Post-operative Plan:   Informed Consent: I have reviewed the patients History and Physical, chart, labs and discussed the procedure including the risks, benefits and alternatives for the proposed anesthesia with the patient or authorized representative who has indicated his/her understanding and  acceptance.   Dental Advisory Given  Plan Discussed with: Anesthesiologist, CRNA and Surgeon  Anesthesia Plan Comments: (Patient consented for risks of anesthesia including but not limited to:  - adverse reactions to medications - risk of intubation if required - damage to teeth, lips or other oral mucosa - sore throat or hoarseness - Damage to heart, brain, lungs or loss of life  Patient voiced understanding.)        Anesthesia Quick Evaluation

## 2017-04-15 NOTE — Anesthesia Postprocedure Evaluation (Signed)
Anesthesia Post Note  Patient: Marie Perry  Procedure(s) Performed: COLONOSCOPY WITH PROPOFOL (N/A )  Patient location during evaluation: Endoscopy Anesthesia Type: General Level of consciousness: awake and alert Pain management: pain level controlled Vital Signs Assessment: post-procedure vital signs reviewed and stable Respiratory status: spontaneous breathing, nonlabored ventilation, respiratory function stable and patient connected to nasal cannula oxygen Cardiovascular status: blood pressure returned to baseline and stable Postop Assessment: no apparent nausea or vomiting Anesthetic complications: no     Last Vitals:  Vitals:   04/15/17 1054 04/15/17 1104  BP: (!) 100/49 108/64  Pulse: 72 61  Resp: (!) 23 16  Temp:    SpO2: 100% 100%    Last Pain:  Vitals:   04/15/17 1034  TempSrc: Tympanic                 Martha Clan

## 2017-04-15 NOTE — Transfer of Care (Signed)
Immediate Anesthesia Transfer of Care Note  Patient: Marie Perry  Procedure(s) Performed: COLONOSCOPY WITH PROPOFOL (N/A )  Patient Location: PACU and Endoscopy Unit  Anesthesia Type:General  Level of Consciousness: awake, alert , oriented and patient cooperative  Airway & Oxygen Therapy: Patient Spontanous Breathing and Patient connected to nasal cannula oxygen  Post-op Assessment: Report given to RN and Post -op Vital signs reviewed and stable  Post vital signs: Reviewed and stable  Last Vitals:  Vitals:   04/15/17 0918 04/15/17 1034  BP: 135/78 (!) 93/43  Pulse: 83 75  Resp: 20 15  Temp: 36.5 C (!) 36.3 C  SpO2: 99% 100%    Last Pain:  Vitals:   04/15/17 1034  TempSrc: Tympanic         Complications: No apparent anesthesia complications

## 2017-04-15 NOTE — Progress Notes (Signed)
Patient notified of results by phone.

## 2017-04-15 NOTE — H&P (Signed)
Outpatient short stay form Pre-procedure 04/15/2017 9:45 AM Lollie Sails MD  Primary Physician: Kathrine Haddock NP  Reason for visit: Colonoscopy  History of present illness: Patient is a 67 year old female presenting today as above.  She had a colonoscopy that was incomplete on 09/17/2016 with finding of a colon polyp that was removed but not retrieved.  The prep was poor and I was unable to complete the procedure.  He is presenting today for follow-up in this regard.    Current Facility-Administered Medications:  .  0.9 %  sodium chloride infusion, , Intravenous, Continuous, Lollie Sails, MD, Last Rate: 20 mL/hr at 04/15/17 0930, 1,000 mL at 04/15/17 0930  Medications Prior to Admission  Medication Sig Dispense Refill Last Dose  . Cholecalciferol (VITAMIN D PO) Take by mouth daily.   Past Week at Unknown time  . Cyanocobalamin (B-12 PO) Take by mouth daily.   Past Week at Unknown time  . DULoxetine (CYMBALTA) 60 MG capsule Take 1 capsule (60 mg total) by mouth daily. 90 capsule 3 04/14/2017 at Unknown time  . omeprazole (PRILOSEC) 20 MG capsule Take 1 capsule (20 mg total) by mouth daily. 30 capsule 3 Past Week at Unknown time     No Known Allergies   Past Medical History:  Diagnosis Date  . Allergy   . Anxiety   . Depression   . GERD (gastroesophageal reflux disease)   . IBS (irritable bowel syndrome)   . IFG (impaired fasting glucose)   . Osteopenia   . Sleep apnea   . Vitamin D deficiency     Review of systems:      Physical Exam    Heart and lungs: Regular rate and rhythm without rub or gallop, lungs are bilaterally clear.    HEENT: Cephalic atraumatic eyes are anicteric    Other:    Pertinant exam for procedure: Soft nontender nondistended bowel sounds positive normoactive.    Planned proceedures: Colonoscopy and indicated procedures. I have discussed the risks benefits and complications of procedures to include not limited to bleeding, infection,  perforation and the risk of sedation and the patient wishes to proceed.    Lollie Sails, MD Gastroenterology 04/15/2017  9:45 AM

## 2017-04-15 NOTE — Op Note (Signed)
[facility] Gastroenterology Patient Name: Marie Perry Procedure Date: 04/15/2017 9:44 AM MRN: [id number] Account #: [id number] Date of Birth: 05-23-1950 Admit Type: Outpatient Age: 67 Room: [facility] ENDO ROOM 1 Gender: Female Note Status: Finalized Procedure:            Colonoscopy Indications:          Personal history of colonic polyps Providers:            Lollie Sails, MD Referring MD:         Kathrine Haddock (Referring MD) Medicines:            Monitored Anesthesia Care Complications:        No immediate complications. Procedure:            Pre-Anesthesia Assessment:                       - ASA Grade Assessment: III - A patient with severe                        systemic disease.                       After obtaining informed consent, the colonoscope was                        passed under direct vision. Throughout the procedure,                        the patient's blood pressure, pulse, and oxygen                        saturations were monitored continuously. The                        Colonoscope was introduced through the anus and                        advanced to the the cecum, identified by appendiceal                        orifice and ileocecal valve. The colonoscopy was                        unusually difficult due to significant looping.                        Successful completion of the procedure was aided by                        changing the patient to a supine position and using                        manual pressure. The patient tolerated the procedure                        well. The quality of the bowel preparation was fair                        except the ascending colon was poor. Findings:      Multiple small and medium-mouthed diverticula were found in the sigmoid  colon, descending colon and transverse colon.      A diffuse area of mildly melanotic mucosa was found in the entire colon.      The retroflexed view of the distal  rectum and anal verge was normal and       showed no anal or rectal abnormalities.      The exam was otherwise without abnormality. Impression:           - Diverticulosis in the sigmoid colon, in the                        descending colon and in the transverse colon.                       - Melanotic mucosa in the entire examined colon.                       - The distal rectum and anal verge are normal on                        retroflexion view.                       - The examination was otherwise normal.                       - No specimens collected. Recommendation:       - Discharge patient to home.                       - Repeat colonoscopy in 3 years because the bowel                        preparation was poor. Procedure Code(s):    --- Professional ---                       [phone], Colonoscopy, flexible; diagnostic, including                        collection of specimen(s) by brushing or washing, when                        performed (separate procedure) Diagnosis Code(s):    --- Professional ---                       K63.89, Other specified diseases of intestine                       Z86.010, Personal history of colonic polyps                       K57.30, Diverticulosis of large intestine without                        perforation or abscess without bleeding CPT copyright 2016 American [employer]. All rights reserved. The codes documented in this report are preliminary and upon coder review may  be revised to meet current compliance requirements. Lollie Sails, MD 04/15/2017 10:35:15 AM This report has been signed electronically. Number of Addenda: 0 Note Initiated On: 04/15/2017 9:44 AM Scope Withdrawal Time: 0 hours 11 minutes 8 seconds  Total Procedure Duration: 0 hours 29 minutes 9 seconds       [facility]

## 2017-04-15 NOTE — Telephone Encounter (Signed)
Discussed Osteoporosis.  Will rx Boniva monthly

## 2017-04-15 NOTE — Anesthesia Post-op Follow-up Note (Signed)
Anesthesia QCDR form completed.        

## 2017-04-16 ENCOUNTER — Encounter: Payer: Self-pay | Admitting: Gastroenterology

## 2017-06-05 ENCOUNTER — Telehealth: Payer: Self-pay | Admitting: Unknown Physician Specialty

## 2017-06-05 NOTE — Telephone Encounter (Signed)
Patient called to find out the status of the referral placed during AWV by Tyler Aas, LPN  on 0/14/10.  Patient states that she has not received any information regarding assistance for her mother and is eager for someone to contact her with resources. Please call patient as soon as possible at [phone].

## 2017-06-06 ENCOUNTER — Telehealth: Payer: Self-pay | Admitting: Unknown Physician Specialty

## 2017-06-06 DIAGNOSIS — H2513 Age-related nuclear cataract, bilateral: Secondary | ICD-10-CM | POA: Diagnosis not present

## 2017-06-06 DIAGNOSIS — H1045 Other chronic allergic conjunctivitis: Secondary | ICD-10-CM | POA: Diagnosis not present

## 2017-06-06 DIAGNOSIS — H5203 Hypermetropia, bilateral: Secondary | ICD-10-CM | POA: Diagnosis not present

## 2017-06-06 NOTE — Telephone Encounter (Signed)
Contacted patient regarding referral after patient had called office yesterday requesting assist.  Patient on her way to appt today and CG will call her back at 5pm to discuss [facility] program and respite services provided through [place] [facility].

## 2017-06-06 NOTE — Telephone Encounter (Signed)
Contacted patient regarding referral after patient had called office yesterday requesting assist.  Patient on her way to appt today and CG will call her back at 5pm to discuss [facility] program and respite services provided through [place] [facility].

## 2017-08-08 ENCOUNTER — Emergency Department: Payer: Medicare Other

## 2017-08-08 ENCOUNTER — Emergency Department
Admission: EM | Admit: 2017-08-08 | Discharge: 2017-08-09 | Disposition: A | Discharge status: Home / Self Care | Payer: Medicare Other | Attending: Emergency Medicine | Admitting: Emergency Medicine

## 2017-08-08 DIAGNOSIS — Y999 Unspecified external cause status: Secondary | ICD-10-CM | POA: Diagnosis not present

## 2017-08-08 DIAGNOSIS — S52692A Other fracture of lower end of left ulna, initial encounter for closed fracture: Secondary | ICD-10-CM | POA: Diagnosis not present

## 2017-08-08 DIAGNOSIS — Y929 Unspecified place or not applicable: Secondary | ICD-10-CM | POA: Diagnosis not present

## 2017-08-08 DIAGNOSIS — R202 Paresthesia of skin: Secondary | ICD-10-CM | POA: Insufficient documentation

## 2017-08-08 DIAGNOSIS — S52592A Other fractures of lower end of left radius, initial encounter for closed fracture: Secondary | ICD-10-CM | POA: Diagnosis not present

## 2017-08-08 DIAGNOSIS — M810 Age-related osteoporosis without current pathological fracture: Secondary | ICD-10-CM | POA: Insufficient documentation

## 2017-08-08 DIAGNOSIS — S6992XA Unspecified injury of left wrist, hand and finger(s), initial encounter: Secondary | ICD-10-CM | POA: Diagnosis present

## 2017-08-08 DIAGNOSIS — W010XXA Fall on same level from slipping, tripping and stumbling without subsequent striking against object, initial encounter: Secondary | ICD-10-CM | POA: Diagnosis not present

## 2017-08-08 DIAGNOSIS — Z79899 Other long term (current) drug therapy: Secondary | ICD-10-CM | POA: Diagnosis not present

## 2017-08-08 DIAGNOSIS — Y939 Activity, unspecified: Secondary | ICD-10-CM | POA: Diagnosis not present

## 2017-08-08 DIAGNOSIS — S52532A Colles' fracture of left radius, initial encounter for closed fracture: Secondary | ICD-10-CM | POA: Diagnosis not present

## 2017-08-08 MED ORDER — IBUPROFEN 600 MG PO TABS
600.0000 mg | ORAL_TABLET | Freq: Once | ORAL | Status: AC
  Administered 2017-08-09: 600 mg via ORAL
  Filled 2017-08-08: qty 1

## 2017-08-08 MED ORDER — OXYCODONE-ACETAMINOPHEN 5-325 MG PO TABS
1.0000 | ORAL_TABLET | Freq: Once | ORAL | Status: AC
  Administered 2017-08-09: 1 via ORAL
  Filled 2017-08-08: qty 1

## 2017-08-08 MED ORDER — BUPIVACAINE HCL (PF) 0.5 % IJ SOLN
30.0000 mL | Freq: Once | INTRAMUSCULAR | Status: AC
  Administered 2017-08-09: 30 mL
  Filled 2017-08-08: qty 30

## 2017-08-08 NOTE — Discharge Instructions (Addendum)
It was a pleasure to take care of you today, and thank you for coming to our emergency department.  If you have any questions or concerns before leaving please ask the nurse to grab me and I'm more than happy to go through your aftercare instructions again.  If you were prescribed any opioid pain medication today such as Norco, Vicodin, Percocet, morphine, hydrocodone, or oxycodone please make sure you do not drive when you are taking this medication as it can alter your ability to drive safely.  If you have any concerns once you are home that you are not improving or are in fact getting worse before you can make it to your follow-up appointment, please do not hesitate to call 911 and come back for further evaluation.  Darel Hong, MD  Results for orders placed or performed in visit on 02/22/17  Hepatitis C antibody screen  Result Value Ref Range   Hep C Virus Ab <0.1 0.0 - 0.9 s/co ratio  HIV antibody  Result Value Ref Range   HIV Screen 4th Generation wRfx Non Reactive Non Reactive  Comprehensive metabolic panel  Result Value Ref Range   Glucose 88 65 - 99 mg/dL   BUN 20 8 - 27 mg/dL   Creatinine, Ser 0.83 0.57 - 1.00 mg/dL   GFR calc non Af Amer 74 >59 mL/min/1.73   GFR calc Af Amer 86 >59 mL/min/1.73   BUN/Creatinine Ratio 24 12 - 28   Sodium 144 134 - 144 mmol/L   Potassium 4.2 3.5 - 5.2 mmol/L   Chloride 104 96 - 106 mmol/L   CO2 25 20 - 29 mmol/L   Calcium 9.8 8.7 - 10.3 mg/dL   Total Protein 7.1 6.0 - 8.5 g/dL   Albumin 4.7 3.6 - 4.8 g/dL   Globulin, Total 2.4 1.5 - 4.5 g/dL   Albumin/Globulin Ratio 2.0 1.2 - 2.2   Bilirubin Total 0.5 0.0 - 1.2 mg/dL   Alkaline Phosphatase 89 39 - 117 IU/L   AST 15 0 - 40 IU/L   ALT 11 0 - 32 IU/L  Lipid Panel w/o Chol/HDL Ratio  Result Value Ref Range   Cholesterol, Total 215 (H) 100 - 199 mg/dL   Triglycerides 138 0 - 149 mg/dL   HDL 53 >39 mg/dL   VLDL Cholesterol Cal 28 5 - 40 mg/dL   LDL Calculated 134 (H) 0 - 99 mg/dL   Dg  Wrist Complete Left  Result Date: 08/08/2017 CLINICAL DATA:  Patient fell 1 hour ago.  Swelling and wrist pain. EXAM: LEFT WRIST - COMPLETE 3+ VIEW COMPARISON:  None. FINDINGS: Acute, closed, dorsally angulated fractures of the distal radius and ulna are noted with intra-articular extension of radius fracture into the radiocarpal joint at the level of the proximal scaphoid. Small accessory ossicle is seen off the tip of the ulnar styloid. Carpal rows are maintained. Soft tissue swelling is noted about the distal forearm. IMPRESSION: Acute distal radius and ulnar fractures with dorsal angulation and intra-articular extension of radius fracture into the radiocarpal joint. Electronically Signed   By: Ashley Royalty M.D.   On: 08/08/2017 23:05

## 2017-08-08 NOTE — ED Triage Notes (Signed)
Patient fell approx 1 hour ago. Patient c/o left wrist and swelling post fall.

## 2017-08-08 NOTE — ED Provider Notes (Signed)
[facility] Emergency Department Provider Note  ____________________________________________   First MD Initiated Contact with Patient 08/08/17 2334     (approximate)  I have reviewed the triage vital signs and the nursing notes.   HISTORY  Chief Complaint Wrist Pain    HPI Marie Perry is a 67 y.o. female who self presents to the emergency department with sudden onset severe left wrist pain that began after she tripped and fell onto an outstretched hand shortly prior to arrival.  She did not hit her head.  She did not pass out.  No antecedent chest pain or palpitations.  Pain is in her wrist.  It is not in her elbow.  Some tingling in her fingers.  She does not take any blood thinning or antiplatelet medications.  Past Medical History:  Diagnosis Date  . Allergy   . Anxiety   . Depression   . GERD (gastroesophageal reflux disease)   . IBS (irritable bowel syndrome)   . IFG (impaired fasting glucose)   . Osteopenia   . Sleep apnea   . Vitamin D deficiency     Patient Active Problem List   Diagnosis Date Noted  . Osteoporosis 04/15/2017  . Advanced care planning/counseling discussion 02/22/2017  . Hypertriglyceridemia 02/22/2017  . Obesity 10/07/2015  . De Quervain's tenosynovitis, right 05/03/2015  . IBS (irritable bowel syndrome) 04/29/2015  . Allergic rhinitis 04/29/2015  . Osteopenia 04/29/2015  . GERD (gastroesophageal reflux disease) 04/29/2015  . Recurrent major depression-severe ([place]) 04/29/2015  . IFG (impaired fasting glucose) 04/29/2015  . Vitamin D deficiency 04/29/2015  . Sleep apnea 04/29/2015  . Acute anxiety 04/29/2015    Past Surgical History:  Procedure Laterality Date  . bright's surgery    . COLONOSCOPY N/A 09/17/2016   Procedure: COLONOSCOPY;  Surgeon: Lollie Sails, MD;  Location: [facility] ENDOSCOPY;  Service: Endoscopy;  Laterality: N/A;  . COLONOSCOPY WITH PROPOFOL N/A 04/15/2017   Procedure: COLONOSCOPY  WITH PROPOFOL;  Surgeon: Lollie Sails, MD;  Location: [facility] ENDOSCOPY;  Service: Endoscopy;  Laterality: N/A;  . SKIN TAG REMOVAL     rectal    Prior to Admission medications   Medication Sig Start Date End Date Taking? Authorizing Provider  Cholecalciferol (VITAMIN D PO) Take by mouth daily.    [provider]  Cyanocobalamin (B-12 PO) Take by mouth daily.    [provider]  DULoxetine (CYMBALTA) 60 MG capsule Take 1 capsule (60 mg total) by mouth daily. 02/22/17   Kathrine Haddock, NP  ibandronate (BONIVA) 150 MG tablet Take 1 tablet (150 mg total) by mouth every 30 (thirty) days. Take in the morning with a full glass of water, on an empty stomach, and do not take anything else by mouth or lie down for the next 30 min. 04/15/17   Kathrine Haddock, NP  omeprazole (PRILOSEC) 20 MG capsule Take 1 capsule (20 mg total) by mouth daily. 02/22/17   Kathrine Haddock, NP  oxyCODONE-acetaminophen (PERCOCET/ROXICET) 5-325 MG tablet Take 1 tablet by mouth every 4 (four) hours as needed for severe pain. 08/09/17   Darel Hong, MD    Allergies Patient has no known allergies.  Family History  Problem Relation Age of Onset  . Mental illness Mother   . Depression Mother   . Stroke Father   . Heart disease Father   . Cancer Maternal Grandmother        ovarian  . Anemia Maternal Grandmother   . Lung disease Maternal Grandfather   .  Birth defects Maternal Grandfather   . Depression Sister   . Fibromyalgia Brother   . GER disease Brother   . Heart disease Paternal Grandmother   . Birth defects Paternal Grandfather   . Depression Brother   . Breast cancer Neg Hx     Social History Social History   Tobacco Use  . Smoking status: Never Smoker  . Smokeless tobacco: Never Used  Substance Use Topics  . Alcohol use: Yes    Alcohol/week: 0.0 oz    Comment: on occasion  . Drug use: No    Review of Systems Constitutional: No fever/chills Cardiovascular: Denies chest  pain. Respiratory: Denies shortness of breath. Gastrointestinal: No abdominal pain.  No nausea, no vomiting.   Musculoskeletal: Positive for wrist pain Neurological: Negative for headaches   ____________________________________________   PHYSICAL EXAM:  VITAL SIGNS: ED Triage Vitals  Enc Vitals Group     BP 08/08/17 2239 119/67     Pulse Rate 08/08/17 2239 66     Resp 08/08/17 2239 18     Temp 08/08/17 2239 98.6 F (37 C)     Temp Source 08/08/17 2239 Oral     SpO2 08/08/17 2239 98 %     Weight 08/08/17 2237 180 lb (81.6 kg)     Height 08/08/17 2240 5\' 6"  (1.676 m)     Head Circumference --      Peak Flow --      Pain Score 08/08/17 2237 7     Pain Loc --      Pain Edu? --      Excl. in [place]? --     Constitutional: Alert and oriented x4 obviously uncomfortable splinting her left wrist with her right hand Head: Atraumatic. Nose: No congestion/rhinnorhea. Mouth/Throat: No trismus Neck: No stridor.   Cardiovascular: Regular rate and rhythm Respiratory: Normal respiratory effort.  No retractions. MSK: No obvious deformity to distal wrist.  Tender over distal radius and distal ulna Sensation intact to light touch over first dorsal webspace, distal index finger, distal small finger Can flex and oppose  thumb, cross 2 on 3, and extend wrist 2+ radial pulse and less than 2 second capillary refill Skin is closed Compartments are soft  Neurologic:  Normal speech and language. No gross focal neurologic deficits are appreciated.  Skin:  Skin is warm, dry and intact. No rash noted.    ____________________________________________  LABS (all labs ordered are listed, but only abnormal results are displayed)  Labs Reviewed - No data to display   __________________________________________  EKG   ____________________________________________  RADIOLOGY  X-ray of the wrist reviewed by me shows dorsal angulation and distal radius and ulna fracture Repeat x-ray reviewed by  me confirms improved anatomical alignment ____________________________________________   DIFFERENTIAL includes but not limited to  Colles' fracture, Smith fracture, Monteggia fracture, Galeazzi fracture   PROCEDURES  Procedure(s) performed: Yes  .Nerve Block Date/Time: 08/08/2017 11:56 PM Performed by: Darel Hong, MD Authorized by: Darel Hong, MD   Consent:    Consent obtained:  Verbal   Consent given by:  Patient   Risks discussed:  Nerve damage, swelling, unsuccessful block and pain   Alternatives discussed:  No treatment and delayed treatment Indications:    Indications:  Procedural anesthesia and pain relief Location:    Body area:  Upper extremity   Laterality:  Left Pre-procedure details:    Skin preparation:  2% chlorhexidine Skin anesthesia (see MAR for exact dosages):    Skin anesthesia method:  None Procedure  details (see MAR for exact dosages):    Block needle gauge:  21 G   Anesthetic injected:  Bupivacaine 0.5% w/o epi   Steroid injected:  None   Additive injected:  None   Injection procedure:  Anatomic landmarks identified, incremental injection and anatomic landmarks palpated   Paresthesia:  None Post-procedure details:    Dressing:  None   Outcome:  Pain improved Comments:     8cc bupivicaine injected into radius with flash of blood in hematoma block .Nerve Block Date/Time: 08/08/2017 11:56 PM Performed by: Darel Hong, MD Authorized by: Darel Hong, MD   Consent:    Consent obtained:  Verbal   Consent given by:  Parent   Risks discussed:  Swelling, unsuccessful block, pain and bleeding   Alternatives discussed:  No treatment and delayed treatment Indications:    Indications:  Pain relief and procedural anesthesia Location:    Body area:  Upper extremity   Laterality:  Left Pre-procedure details:    Skin preparation:  2% chlorhexidine Skin anesthesia (see MAR for exact dosages):    Skin anesthesia method:  None Procedure  details (see MAR for exact dosages):    Block needle gauge:  21 G   Anesthetic injected:  Bupivacaine 0.5% w/o epi   Steroid injected:  None   Additive injected:  None   Injection procedure:  Anatomic landmarks identified and anatomic landmarks palpated   Paresthesia:  None Post-procedure details:    Outcome:  Pain improved Comments:     6cc bupivicaine injected into ulna after flash of blood Reduction of fracture Date/Time: 08/08/2017 11:56 PM Performed by: Darel Hong, MD Authorized by: Darel Hong, MD  Consent: Verbal consent obtained. Consent given by: patient Patient understanding: patient states understanding of the procedure being performed Patient consent: the patient's understanding of the procedure matches consent given Procedure consent: procedure consent matches procedure scheduled Relevant documents: relevant documents present and verified Test results: test results available and properly labeled Site marked: the operative site was marked Imaging studies: imaging studies available Required items: required blood products, implants, devices, and special equipment available Patient identity confirmed: verbally with patient and arm band Local anesthesia used: yes Anesthesia: hematoma block  Anesthesia: Local anesthesia used: yes Patient tolerance: Patient tolerated the procedure well with no immediate complications Comments: Exacerbated the foosh then relocated the fracture.  Neurovascularly intact following     Critical Care performed: no  Observation: no ____________________________________________   INITIAL IMPRESSION / ASSESSMENT AND PLAN / ED COURSE  Pertinent labs & imaging results that were available during my care of the patient were reviewed by me and considered in my medical decision making (see chart for details).  Patient arrives uncomfortable appearing with an obvious fracture deformity to her left wrist.  X-ray confirms dorsally angulated  fractures of the radius and ulna.  I verbally consented the patient for hematoma blocks and performed one on each side achieving some degree of anesthesia.  Also given oral opioids and nonsteroidals for pain control.  I then reduce the fracture with exacerbating the injury then traction countertraction with adequate results.  Splinted and the patient was neurovascularly intact thereafter although with some tingling in her fingertips.  I have advised orthopedic follow-up within 1 week.  Strict return precautions have been given and the patient verbalizes understanding and agreement with the plan.      ____________________________________________   FINAL CLINICAL IMPRESSION(S) / ED DIAGNOSES  Final diagnoses:  Closed Colles' fracture of left radius, initial encounter  NEW MEDICATIONS STARTED DURING THIS VISIT:  Discharge Medication List as of 08/09/2017 12:45 AM    START taking these medications   Details  oxyCODONE-acetaminophen (PERCOCET/ROXICET) 5-325 MG tablet Take 1 tablet by mouth every 4 (four) hours as needed for severe pain., Starting Fri 08/09/2017, Print         Note:  This document was prepared using Dragon voice recognition software and may include unintentional dictation errors.      Darel Hong, MD 08/11/17 2240

## 2017-08-09 ENCOUNTER — Emergency Department: Payer: Medicare Other

## 2017-08-09 DIAGNOSIS — S52592A Other fractures of lower end of left radius, initial encounter for closed fracture: Secondary | ICD-10-CM | POA: Diagnosis not present

## 2017-08-09 DIAGNOSIS — S52532A Colles' fracture of left radius, initial encounter for closed fracture: Secondary | ICD-10-CM | POA: Diagnosis not present

## 2017-08-09 DIAGNOSIS — S52692A Other fracture of lower end of left ulna, initial encounter for closed fracture: Secondary | ICD-10-CM | POA: Diagnosis not present

## 2017-08-09 MED ORDER — OXYCODONE-ACETAMINOPHEN 5-325 MG PO TABS: 1.0000 | ORAL_TABLET | ORAL | 0 refills | Status: DC | PRN

## 2017-08-12 DIAGNOSIS — S5292XA Unspecified fracture of left forearm, initial encounter for closed fracture: Secondary | ICD-10-CM | POA: Diagnosis not present

## 2017-08-19 DIAGNOSIS — S5292XA Unspecified fracture of left forearm, initial encounter for closed fracture: Secondary | ICD-10-CM | POA: Diagnosis not present

## 2017-08-23 ENCOUNTER — Ambulatory Visit: Payer: Medicare Other | Admitting: Unknown Physician Specialty

## 2017-08-26 DIAGNOSIS — S5292XD Unspecified fracture of left forearm, subsequent encounter for closed fracture with routine healing: Secondary | ICD-10-CM | POA: Diagnosis not present

## 2017-08-30 ENCOUNTER — Encounter: Payer: Self-pay | Admitting: Unknown Physician Specialty

## 2017-08-30 ENCOUNTER — Other Ambulatory Visit: Payer: Self-pay

## 2017-08-30 ENCOUNTER — Ambulatory Visit (INDEPENDENT_AMBULATORY_CARE_PROVIDER_SITE_OTHER): Payer: Medicare Other | Admitting: Unknown Physician Specialty

## 2017-08-30 DIAGNOSIS — F332 Major depressive disorder, recurrent severe without psychotic features: Secondary | ICD-10-CM

## 2017-08-30 DIAGNOSIS — Z7189 Other specified counseling: Secondary | ICD-10-CM

## 2017-08-30 NOTE — Assessment & Plan Note (Addendum)
Some worsening but will follow after cast is removed from left forearm.  For now, continue with daily Cymbalta at 60 mg

## 2017-08-30 NOTE — Assessment & Plan Note (Signed)
Brought in ADs to be scanned

## 2017-08-30 NOTE — Progress Notes (Signed)
BP 105/67   Pulse 76   Temp 98.4 F (36.9 C) (Oral)   Ht 5\' 6"  (1.676 m)   Wt 188 lb (85.3 kg)   LMP  (LMP Unknown)   SpO2 96%   BMI 30.34 kg/m    Subjective:    Patient ID: Marie Perry, female    DOB: 1950/04/14, 67 y.o.   MRN: [id number]  HPI: Marie Perry is a 67 y.o. female  Chief Complaint  Patient presents with  . Follow-up    55m   Osteoporosis Started Boniva 150 mg weekly.  She did fracture her wrist.  Treated in the ER and following with Orthopedics.    Depression Taking Duloxetine.  Mood not as good following hip fracture.   Depression screen [facility] 2/9 08/30/2017 02/22/2017 02/22/2017 04/03/2016 10/07/2015  Decreased Interest 1 1 1  0 2  Down, Depressed, Hopeless 1 1 1 1 1   PHQ - 2 Score 2 2 2 1 3   Altered sleeping 1 1 1  - 2  Tired, decreased energy 2 1 1  - 2  Change in appetite 1 0 0 - 1  Feeling bad or failure about yourself  1 0 0 - 2  Trouble concentrating 1 1 1  - 2  Moving slowly or fidgety/restless 0 0 0 - 0  Suicidal thoughts 0 0 0 - 0  PHQ-9 Score 8 5 5  - 12  Difficult doing work/chores Not difficult at all Not difficult at all - - -     Relevant past medical, surgical, family and social history reviewed and updated as indicated. Interim medical history since our last visit reviewed. Allergies and medications reviewed and updated.  Review of Systems  Per HPI unless specifically indicated above     Objective:    BP 105/67   Pulse 76   Temp 98.4 F (36.9 C) (Oral)   Ht 5\' 6"  (1.676 m)   Wt 188 lb (85.3 kg)   LMP  (LMP Unknown)   SpO2 96%   BMI 30.34 kg/m   Wt Readings from Last 3 Encounters:  08/30/17 188 lb (85.3 kg)  08/08/17 180 lb (81.6 kg)  04/15/17 176 lb (79.8 kg)    Physical Exam  Constitutional: She is oriented to person, place, and time. She appears well-developed and well-nourished. No distress.  HENT:  Head: Normocephalic and atraumatic.  Eyes: Conjunctivae and lids are normal. Right eye exhibits no  discharge. Left eye exhibits no discharge. No scleral icterus.  Neck: Normal range of motion. Neck supple. No JVD present. Carotid bruit is not present.  Cardiovascular: Normal rate, regular rhythm and normal heart sounds.  Pulmonary/Chest: Effort normal and breath sounds normal.  Abdominal: Normal appearance. There is no splenomegaly or hepatomegaly.  Musculoskeletal: Normal range of motion.  Neurological: She is alert and oriented to person, place, and time.  Skin: Skin is warm, dry and intact. No rash noted. No pallor.  Psychiatric: She has a normal mood and affect. Her behavior is normal. Judgment and thought content normal.    Results for orders placed or performed in visit on 02/22/17  Hepatitis C antibody screen  Result Value Ref Range   Hep C Virus Ab <0.1 0.0 - 0.9 s/co ratio  HIV antibody  Result Value Ref Range   HIV Screen 4th Generation wRfx Non Reactive Non Reactive  Comprehensive metabolic panel  Result Value Ref Range   Glucose 88 65 - 99 mg/dL   BUN 20 8 - 27 mg/dL   Creatinine, Ser  0.83 0.57 - 1.00 mg/dL   GFR calc non Af Amer 74 >59 mL/min/1.73   GFR calc Af Amer 86 >59 mL/min/1.73   BUN/Creatinine Ratio 24 12 - 28   Sodium 144 134 - 144 mmol/L   Potassium 4.2 3.5 - 5.2 mmol/L   Chloride 104 96 - 106 mmol/L   CO2 25 20 - 29 mmol/L   Calcium 9.8 8.7 - 10.3 mg/dL   Total Protein 7.1 6.0 - 8.5 g/dL   Albumin 4.7 3.6 - 4.8 g/dL   Globulin, Total 2.4 1.5 - 4.5 g/dL   Albumin/Globulin Ratio 2.0 1.2 - 2.2   Bilirubin Total 0.5 0.0 - 1.2 mg/dL   Alkaline Phosphatase 89 39 - 117 IU/L   AST 15 0 - 40 IU/L   ALT 11 0 - 32 IU/L  Lipid Panel w/o Chol/HDL Ratio  Result Value Ref Range   Cholesterol, Total 215 (H) 100 - 199 mg/dL   Triglycerides 138 0 - 149 mg/dL   HDL 53 >39 mg/dL   VLDL Cholesterol Cal 28 5 - 40 mg/dL   LDL Calculated 134 (H) 0 - 99 mg/dL      Assessment & Plan:   Problem List Items Addressed This Visit      Unprioritized   Advanced care  planning/counseling discussion    Brought in ADs to be scanned      Recurrent major depression-severe ([place])    Some worsening but will follow after cast is removed from left forearm.  For now, continue with daily Cymbalta at 60 mg          Follow up plan: Return in about 6 months (around 03/02/2018).

## 2017-09-09 DIAGNOSIS — S5292XD Unspecified fracture of left forearm, subsequent encounter for closed fracture with routine healing: Secondary | ICD-10-CM | POA: Diagnosis not present

## 2017-09-30 DIAGNOSIS — S5290XD Unspecified fracture of unspecified forearm, subsequent encounter for closed fracture with routine healing: Secondary | ICD-10-CM | POA: Diagnosis not present

## 2017-10-10 DIAGNOSIS — S52532D Colles' fracture of left radius, subsequent encounter for closed fracture with routine healing: Secondary | ICD-10-CM | POA: Diagnosis not present

## 2017-10-17 DIAGNOSIS — S52532D Colles' fracture of left radius, subsequent encounter for closed fracture with routine healing: Secondary | ICD-10-CM | POA: Diagnosis not present

## 2017-10-24 DIAGNOSIS — S52532D Colles' fracture of left radius, subsequent encounter for closed fracture with routine healing: Secondary | ICD-10-CM | POA: Diagnosis not present

## 2017-10-31 DIAGNOSIS — S52532D Colles' fracture of left radius, subsequent encounter for closed fracture with routine healing: Secondary | ICD-10-CM | POA: Diagnosis not present

## 2017-10-31 DIAGNOSIS — S5290XD Unspecified fracture of unspecified forearm, subsequent encounter for closed fracture with routine healing: Secondary | ICD-10-CM | POA: Diagnosis not present

## 2017-11-28 DIAGNOSIS — Z23 Encounter for immunization: Secondary | ICD-10-CM | POA: Diagnosis not present

## 2017-12-23 DIAGNOSIS — S5292XA Unspecified fracture of left forearm, initial encounter for closed fracture: Secondary | ICD-10-CM | POA: Diagnosis not present

## 2018-02-27 ENCOUNTER — Ambulatory Visit (INDEPENDENT_AMBULATORY_CARE_PROVIDER_SITE_OTHER): Payer: Medicare Other

## 2018-02-27 ENCOUNTER — Encounter: Payer: Self-pay | Admitting: Nurse Practitioner

## 2018-02-27 VITALS — BP 127/83 | HR 81 | Temp 98.4°F | Resp 16 | Ht 66.0 in | Wt 199.8 lb

## 2018-02-27 DIAGNOSIS — Z1231 Encounter for screening mammogram for malignant neoplasm of breast: Secondary | ICD-10-CM

## 2018-02-27 DIAGNOSIS — Z0000 Encounter for general adult medical examination without abnormal findings: Secondary | ICD-10-CM

## 2018-02-27 DIAGNOSIS — E538 Deficiency of other specified B group vitamins: Secondary | ICD-10-CM | POA: Insufficient documentation

## 2018-02-27 DIAGNOSIS — Z23 Encounter for immunization: Secondary | ICD-10-CM | POA: Diagnosis not present

## 2018-02-27 NOTE — Patient Instructions (Signed)
Marie Perry , Thank you for taking time to come for your Medicare Wellness Visit. I appreciate your ongoing commitment to your health goals. Please review the following plan we discussed and let me know if I can assist you in the future.   Screening recommendations/referrals: Colonoscopy: done 04/15/17 repeat in 3 years Mammogram: done 04/11/17. Please call [phone] to schedule your mammogram.  Bone Density: done 04/11/17 repeat in 2021 Recommended yearly ophthalmology/optometry visit for glaucoma screening and checkup Recommended yearly dental visit for hygiene and checkup  Vaccinations: Influenza vaccine: done 11/28/17 Pneumococcal vaccine: done today Tdap vaccine: done 10/07/15 Shingles vaccine: Shingrix discussed. Please contact your pharmacy for coverage information.    Conditions/risks identified: Recommend healthy habits of diet and exercise for weight loss.   Next appointment: 02/28/18 1:00 Marie Guarneri NP   Preventive Care 65 Years and Older, Female Preventive care refers to lifestyle choices and visits with your health care provider that can promote health and wellness. What does preventive care include?  A yearly physical exam. This is also called an annual well check.  Dental exams once or twice a year.  Routine eye exams. Ask your health care provider how often you should have your eyes checked.  Personal lifestyle choices, including:  Daily care of your teeth and gums.  Regular physical activity.  Eating a healthy diet.  Avoiding tobacco and drug use.  Limiting alcohol use.  Practicing safe sex.  Taking low-dose aspirin every day.  Taking vitamin and mineral supplements as recommended by your health care provider. What happens during an annual well check? The services and screenings done by your health care provider during your annual well check will depend on your age, overall health, lifestyle risk factors, and family history of disease. Counseling    Your health care provider may ask you questions about your:  Alcohol use.  Tobacco use.  Drug use.  Emotional well-being.  Home and relationship well-being.  Sexual activity.  Eating habits.  History of falls.  Memory and ability to understand (cognition).  Work and work Statistician.  Reproductive health. Screening  You may have the following tests or measurements:  Height, weight, and BMI.  Blood pressure.  Lipid and cholesterol levels. These may be checked every 5 years, or more frequently if you are over 6 years old.  Skin check.  Lung cancer screening. You may have this screening every year starting at age 70 if you have a 30-pack-year history of smoking and currently smoke or have quit within the past 15 years.  Fecal occult blood test (FOBT) of the stool. You may have this test every year starting at age 66.  Flexible sigmoidoscopy or colonoscopy. You may have a sigmoidoscopy every 5 years or a colonoscopy every 10 years starting at age 81.  Hepatitis C blood test.  Hepatitis B blood test.  Sexually transmitted disease (STD) testing.  Diabetes screening. This is done by checking your blood sugar (glucose) after you have not eaten for a while (fasting). You may have this done every 1-3 years.  Bone density scan. This is done to screen for osteoporosis. You may have this done starting at age 98.  Mammogram. This may be done every 1-2 years. Talk to your health care provider about how often you should have regular mammograms. Talk with your health care provider about your test results, treatment options, and if necessary, the need for more tests. Vaccines  Your health care provider may recommend certain vaccines, such as:  Influenza vaccine.  This is recommended every year.  Tetanus, diphtheria, and acellular pertussis (Tdap, Td) vaccine. You may need a Td booster every 10 years.  Zoster vaccine. You may need this after age 58.  Pneumococcal 13-valent  conjugate (PCV13) vaccine. One dose is recommended after age 4.  Pneumococcal polysaccharide (PPSV23) vaccine. One dose is recommended after age 62. Talk to your health care provider about which screenings and vaccines you need and how often you need them. This information is not intended to replace advice given to you by your health care provider. Make sure you discuss any questions you have with your health care provider. Document Released: 02/25/2015 Document Revised: 10/19/2015 Document Reviewed: 11/30/2014 Elsevier Interactive Patient Education  2017 [place] Prevention in the Home Falls can cause injuries. They can happen to people of all ages. There are many things you can do to make your home safe and to help prevent falls. What can I do on the outside of my home?  Regularly fix the edges of walkways and driveways and fix any cracks.  Remove anything that might make you trip as you walk through a door, such as a raised step or threshold.  Trim any bushes or trees on the path to your home.  Use bright outdoor lighting.  Clear any walking paths of anything that might make someone trip, such as rocks or tools.  Regularly check to see if handrails are loose or broken. Make sure that both sides of any steps have handrails.  Any raised decks and porches should have guardrails on the edges.  Have any leaves, snow, or ice cleared regularly.  Use sand or salt on walking paths during winter.  Clean up any spills in your garage right away. This includes oil or grease spills. What can I do in the bathroom?  Use night lights.  Install grab bars by the toilet and in the tub and shower. Do not use towel bars as grab bars.  Use non-skid mats or decals in the tub or shower.  If you need to sit down in the shower, use a plastic, non-slip stool.  Keep the floor dry. Clean up any water that spills on the floor as soon as it happens.  Remove soap buildup in the tub or  shower regularly.  Attach bath mats securely with double-sided non-slip rug tape.  Do not have throw rugs and other things on the floor that can make you trip. What can I do in the bedroom?  Use night lights.  Make sure that you have a light by your bed that is easy to reach.  Do not use any sheets or blankets that are too big for your bed. They should not hang down onto the floor.  Have a firm chair that has side arms. You can use this for support while you get dressed.  Do not have throw rugs and other things on the floor that can make you trip. What can I do in the kitchen?  Clean up any spills right away.  Avoid walking on wet floors.  Keep items that you use a lot in easy-to-reach places.  If you need to reach something above you, use a strong step stool that has a grab bar.  Keep electrical cords out of the way.  Do not use floor polish or wax that makes floors slippery. If you must use wax, use non-skid floor wax.  Do not have throw rugs and other things on the floor that can make  you trip. What can I do with my stairs?  Do not leave any items on the stairs.  Make sure that there are handrails on both sides of the stairs and use them. Fix handrails that are broken or loose. Make sure that handrails are as long as the stairways.  Check any carpeting to make sure that it is firmly attached to the stairs. Fix any carpet that is loose or worn.  Avoid having throw rugs at the top or bottom of the stairs. If you do have throw rugs, attach them to the floor with carpet tape.  Make sure that you have a light switch at the top of the stairs and the bottom of the stairs. If you do not have them, ask someone to add them for you. What else can I do to help prevent falls?  Wear shoes that:  Do not have high heels.  Have rubber bottoms.  Are comfortable and fit you well.  Are closed at the toe. Do not wear sandals.  If you use a stepladder:  Make sure that it is fully  opened. Do not climb a closed stepladder. Make Pneumococcal Polysaccharide Vaccine: What You Need to Know 1. Why get vaccinated? Vaccination can protect older adults (and some children and younger adults) from pneumococcal disease. Pneumococcal disease is caused by bacteria that can spread from person to person through close contact. It can cause ear infections, and it can also lead to more serious infections of the:  Lungs (pneumonia),  Blood (bacteremia), and  Covering of the brain and spinal cord (meningitis). Meningitis can cause deafness and brain damage, and it can be fatal. Anyone can get pneumococcal disease, but children under 33 years of age, people with certain medical conditions, adults over 78 years of age, and cigarette smokers are at the highest risk. About 18,000 older adults die each year from pneumococcal disease in the [place]. Treatment of pneumococcal infections with penicillin and other drugs used to be more effective. But some strains of the disease have become resistant to these drugs. This makes prevention of the disease, through vaccination, even more important. 2. Pneumococcal polysaccharide vaccine (PPSV23) Pneumococcal polysaccharide vaccine (PPSV23) protects against 23 types of pneumococcal bacteria. It will not prevent all pneumococcal disease. PPSV23 is recommended for:  All adults 28 years of age and older,  Anyone 2 through 67 years of age with certain long-term health problems,  Anyone 2 through 67 years of age with a weakened immune system,  Adults 78 through 67 years of age who smoke cigarettes or have asthma. Most people need only one dose of PPSV. A second dose is recommended for certain high-risk groups. People 32 and older should get a dose even if they have gotten one or more doses of the vaccine before they turned 65. Your healthcare provider can give you more information about these recommendations. Most healthy adults develop protection  within 2 to 3 weeks of getting the shot. 3. Some people should not get this vaccine  Anyone who has had a life-threatening allergic reaction to PPSV should not get another dose.  Anyone who has a severe allergy to any component of PPSV should not receive it. Tell your provider if you have any severe allergies.  Anyone who is moderately or severely ill when the shot is scheduled may be asked to wait until they recover before getting the vaccine. Someone with a mild illness can usually be vaccinated.  Children less than 70 years of age should  not receive this vaccine.  There is no evidence that PPSV is harmful to either a pregnant woman or to her fetus. However, as a precaution, women who need the vaccine should be vaccinated before becoming pregnant, if possible. 4. Risks of a vaccine reaction With any medicine, including vaccines, there is a chance of side effects. These are usually mild and go away on their own, but serious reactions are also possible. About half of people who get PPSV have mild side effects, such as redness or pain where the shot is given, which go away within about two days. Less than 1 out of 100 people develop a fever, muscle aches, or more severe local reactions. Problems that could happen after any vaccine:  People sometimes faint after a medical procedure, including vaccination. Sitting or lying down for about 15 minutes can help prevent fainting, and injuries caused by a fall. Tell your doctor if you feel dizzy, or have vision changes or ringing in the ears.  Some people get severe pain in the shoulder and have difficulty moving the arm where a shot was given. This happens very rarely.  Any medication can cause a severe allergic reaction. Such reactions from a vaccine are very rare, estimated at about 1 in a million doses, and would happen within a few minutes to a few hours after the vaccination. As with any medicine, there is a very remote chance of a vaccine causing  a serious injury or death. The safety of vaccines is always being monitored. For more information, visit: http://www.aguilar.org/ 5. What if there is a serious reaction? What should I look for? Look for anything that concerns you, such as signs of a severe allergic reaction, very high fever, or unusual behavior. Signs of a severe allergic reaction can include hives, swelling of the face and throat, difficulty breathing, a fast heartbeat, dizziness, and weakness. These would usually start a few minutes to a few hours after the vaccination. What should I do? If you think it is a severe allergic reaction or other emergency that can't wait, call 9-1-1 or get to the nearest hospital. Otherwise, call your doctor. Afterward, the reaction should be reported to the Vaccine Adverse Event Reporting System (VAERS). Your doctor might file this report, or you can do it yourself through the VAERS web site at www.vaers.SamedayNews.es, or by calling 401-781-3573. VAERS does not give medical advice. 6. How can I learn more?  Ask your doctor. He or she can give you the vaccine package insert or suggest other sources of information.  Call your local or state health department.  Contact the Centers for Disease Control and Prevention (CDC): ? Call 763-597-4395 (1-800-CDC-INFO) or ? Visit CDC's website at http://hunter.com/ CDC Vaccine Information Statement PPSV Vaccine (06/05/2013) This information is not intended to replace advice given to you by your health care provider. Make sure you discuss any questions you have with your health care provider. Document Released: 11/26/2005 Document Revised: 09/10/2017 Document Reviewed: 09/10/2017 Elsevier Interactive Patient Education  [employer].  sure that both sides of the stepladder are locked into place.  Ask someone to hold it for you, if possible.  Clearly mark and make sure that you can see:  Any grab bars or handrails.  First and last  steps.  Where the edge of each step is.  Use tools that help you move around (mobility aids) if they are needed. These include:  Canes.  Walkers.  Scooters.  Crutches.  Turn on the lights when you  go into a dark area. Replace any light bulbs as soon as they burn out.  Set up your furniture so you have a clear path. Avoid moving your furniture around.  If any of your floors are uneven, fix them.  If there are any pets around you, be aware of where they are.  Review your medicines with your doctor. Some medicines can make you feel dizzy. This can increase your chance of falling. Ask your doctor what other things that you can do to help prevent falls. This information is not intended to replace advice given to you by your health care provider. Make sure you discuss any questions you have with your health care provider. Document Released: 11/25/2008 Document Revised: 07/07/2015 Document Reviewed: 03/05/2014 Elsevier Interactive Patient Education  2017 [employer].

## 2018-02-27 NOTE — Progress Notes (Signed)
Subjective:   Marie Perry is a 68 y.o. female who presents for Medicare Annual (Subsequent) preventive examination.  Review of Systems:   Cardiac Risk Factors include: advanced age (>33men, >16 women);obesity (BMI >30kg/m2)     Objective:     Vitals: BP 127/83 (BP Location: Left Arm, Patient Position: Sitting, Cuff Size: Normal)   Pulse 81   Temp 98.4 F (36.9 C) (Temporal)   Resp 16   Ht 5\' 6"  (1.676 m)   Wt 199 lb 12.8 oz (90.6 kg)   LMP  (LMP Unknown)   SpO2 97%   BMI 32.25 kg/m   Body mass index is 32.25 kg/m.  Advanced Directives 02/27/2018 04/15/2017 02/22/2017 09/17/2016  Does Patient Have a Medical Advance Directive? Yes No No No  Type of Paramedic of [place];Living will - - -  Copy of [place] in Chart? Yes - validated most recent copy scanned in chart (See row information) - - -  Would patient like information on creating a medical advance directive? - - Yes (MAU/Ambulatory/Procedural Areas - Information given) -    Tobacco Social History   Tobacco Use  Smoking Status Never Smoker  Smokeless Tobacco Never Used     Counseling given: Not Answered   Clinical Intake:  Pre-visit preparation completed: Yes  Pain : No/denies pain     Nutritional Status: BMI > 30  Obese Nutritional Risks: None Diabetes: No  How often do you need to have someone help you when you read instructions, pamphlets, or other written materials from your doctor or pharmacy?: 1 - Never What is the last grade level you completed in school?: associates degree  Interpreter Needed?: No  Information entered by :: Clemetine Marker LPN  Past Medical History:  Diagnosis Date  . Allergy   . Anxiety   . Arthritis   . Cataract 02/2016   Yearly eye exam w/Dr. Ellin Mayhew  . 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 Depression   . GERD (gastroesophageal reflux disease)   . Hypertriglyceridemia   . IBS (irritable bowel  syndrome)   . IFG (impaired fasting glucose)   . Osteopenia   . Sleep apnea   . Vitamin D deficiency    Past Surgical History:  Procedure Laterality Date  . bright's surgery    . COLONOSCOPY N/A 09/17/2016   Procedure: COLONOSCOPY;  Surgeon: Lollie Sails, MD;  Location: [facility] ENDOSCOPY;  Service: Endoscopy;  Laterality: N/A;  . COLONOSCOPY WITH PROPOFOL N/A 04/15/2017   Procedure: COLONOSCOPY WITH PROPOFOL;  Surgeon: Lollie Sails, MD;  Location: [facility] ENDOSCOPY;  Service: Endoscopy;  Laterality: N/A;  . SKIN TAG REMOVAL     rectal   Family History  Problem Relation Age of Onset  . Mental illness Mother   . Depression Mother   . Anxiety disorder Mother   . Hearing loss Mother   . Miscarriages / [place] Mother   . Vision loss Mother   . Stroke Father   . Heart disease Father   . Hearing loss Father   . Vision loss Father   . Cancer Maternal Grandmother        ovarian  . Anemia Maternal Grandmother   . Arthritis Maternal Grandmother   . Lung disease Maternal Grandfather   . Birth defects Maternal Grandfather   . Asthma Maternal Grandfather   . Depression Sister   . Fibromyalgia Brother   . GER disease Brother   .  Heart disease Paternal Grandmother   . Birth defects Paternal Grandfather   . Depression Brother   . Asthma Paternal Uncle   . Birth defects Sister   . Early death Sister   . Cancer Maternal Uncle   . Hearing loss Maternal Uncle   . Kidney disease Maternal Uncle   . Depression Brother   . Early death Paternal Uncle   . Hearing loss Maternal Aunt   . Vision loss Maternal Aunt   . Varicose Veins Maternal Aunt   . Heart disease Paternal Uncle   . Stroke Paternal Uncle   . Breast cancer Neg Hx    Social History   Socioeconomic History  . Marital status: Single    Spouse name: Not on file  . Number of children: 0  . Years of education: Not on file  . Highest education level: Associate degree: academic program  Occupational History  .  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 Financial resource strain: Not very hard  . Food insecurity:    Worry: Never true    Inability: Never true  . Transportation needs:    Medical: No    Non-medical: No  Tobacco Use  . Smoking status: Never Smoker  . Smokeless tobacco: Never Used  Substance and Sexual Activity  . Alcohol use: Yes    Alcohol/week: 0.0 standard drinks    Comment: rarely indulge  . Drug use: Never  . Sexual activity: Never  Lifestyle  . Physical activity:    Days per week: 0 days    Minutes per session: 0 min  . Stress: Not at all  Relationships  . Social connections:    Talks on phone: Three times a week    Gets together: Once a week    Attends religious service: More than 4 times per year    Active member of club or organization: No    Attends meetings of clubs or organizations: Never    Relationship status: Divorced  Other Topics Concern  . Not on file  Social History Narrative  . Not on file    Outpatient Encounter Medications as of 02/27/2018  Medication Sig  . Cholecalciferol (VITAMIN D PO) Take by mouth daily.  . Cyanocobalamin (B-12 PO) Take by mouth daily.  . DULoxetine (CYMBALTA) 60 MG capsule Take 1 capsule (60 mg total) by mouth daily.  . [DISCONTINUED] ibandronate (BONIVA) 150 MG tablet Take 1 tablet (150 mg total) by mouth every 30 (thirty) days. Take in the morning with a full glass of water, on an empty stomach, and do not take anything else by mouth or lie down for the next 30 min.  . [DISCONTINUED] omeprazole (PRILOSEC) 20 MG capsule Take 1 capsule (20 mg total) by mouth daily. (Patient not taking: Reported on 08/30/2017)  . [DISCONTINUED] oxyCODONE-acetaminophen (PERCOCET/ROXICET) 5-325 MG tablet Take 1 tablet by mouth every 4 (four) hours as needed for severe pain.   No facility-administered encounter medications on file as of 02/27/2018.     Activities of Daily Living In your present state of health, do you  have any difficulty performing the following activities: 02/27/2018  Hearing? N  Comment declines hearing aids  Vision? N  Comment wears contacts  Difficulty concentrating or making decisions? N  Walking or climbing stairs? N  Dressing or bathing? N  Doing errands, shopping? N  Preparing Food and eating ? N  Using the Toilet? N  In the past six months, have you  accidently leaked urine? N  Do you have problems with loss of bowel control? N  Managing your Medications? N  Managing your Finances? N  Housekeeping or managing your Housekeeping? N  Some recent data might be hidden    Patient Care Team: Kathrine Haddock, NP as PCP - General (Nurse Practitioner)    Assessment:   This is a routine wellness examination for Marie Perry.  Exercise Activities and Dietary recommendations Current Exercise Habits: The patient does not participate in regular exercise at present, Exercise limited by: None identified  Goals    . DIET - INCREASE WATER INTAKE     Recommend drinking at least 6-8 glasses of water a day    . Weight (lb) < 200 lb (90.7 kg)     Pt would like to lose 10 lbs over the next year        Fall Risk Fall Risk  02/27/2018 02/22/2017 02/22/2017 04/03/2016  Falls in the past year? 1 No No No  Number falls in past yr: 1 - - -  Comment June 2019 - - -  Injury with Fall? 1 - - -  Comment mechanical fall in dark room, broken left arm - - -  Risk for fall due to : History of fall(s) - - -  Follow up Falls evaluation completed;Falls prevention discussed - - -   FALL RISK PREVENTION PERTAINING TO THE HOME:  Any stairs in or around the home WITH handrails? Yes  Home free of loose throw rugs in walkways, pet beds, electrical cords, etc? Yes  Adequate lighting in your home to reduce risk of falls? Yes   ASSISTIVE DEVICES UTILIZED TO PREVENT FALLS:  Life alert? No  Use of a cane, walker or w/c? No  Grab bars in the bathroom? Yes  Shower chair or bench in shower? No  Elevated toilet  seat or a handicapped toilet? No   DME ORDERS:  DME order needed?  No   TIMED UP AND GO:  Was the test performed? Yes .  Length of time to ambulate 10 feet: 5 sec.   GAIT:  Appearance of gait: Gait stead-fast and without the use of an assistive device.Education: Fall risk prevention has been discussed.  Intervention(s) required? No    Depression Screen PHQ 2/9 Scores 02/27/2018 08/30/2017 02/22/2017 02/22/2017  PHQ - 2 Score 4 2 2 2   PHQ- 9 Score 14 8 5 5      Cognitive Function     6CIT Screen 02/27/2018 02/22/2017  What Year? 0 points 0 points  What month? 0 points 0 points  What time? 0 points 0 points  Count back from 20 0 points 0 points  Months in reverse 0 points 0 points  Repeat phrase 0 points 0 points  Total Score 0 0    Immunization History  Administered Date(s) Administered  . Influenza Inj Mdck Quad Pf 11/28/2017  . Influenza, High Dose Seasonal PF 11/09/2016  . Influenza-Unspecified 11/22/2014, 12/12/2015, 01/07/2017, 11/28/2017  . Pneumococcal Conjugate-13 02/22/2017  . Pneumococcal Polysaccharide-23 02/27/2018  . Tdap 10/07/2015  . Zoster 03/06/2013    Qualifies for Shingles Vaccine? Yes  Zostavax completed 2015. Due for Shingrix. Education has been provided regarding the importance of this vaccine. Pt has been advised to call insurance company to determine out of pocket expense. Advised may also receive vaccine at local pharmacy or Health Dept. Verbalized acceptance and understanding.  Tdap: Up to date  Flu Vaccine: Up to date  Pneumococcal Vaccine: Due for Pneumococcal vaccine. Does  the patient want to receive this vaccine today?  Yes . Education has been provided regarding the importance of this vaccine but still declined. Advised may receive this vaccine at local pharmacy or Health Dept. Aware to provide a copy of the vaccination record if obtained from local pharmacy or Health Dept. Verbalized acceptance and understanding.   Screening  Tests Health Maintenance  Topic Date Due  . PNA vac Low Risk Adult (2 of 2 - PPSV23) 02/22/2018  . MAMMOGRAM  04/12/2019  . COLONOSCOPY  04/15/2020  . TETANUS/TDAP  10/06/2025  . INFLUENZA VACCINE  Completed  . DEXA SCAN  Completed  . Hepatitis C Screening  Completed    Cancer Screenings:  Colorectal Screening: Completed 04/15/17. Repeat every 3 years.  Mammogram: Completed 04/11/17. Repeat every year.  Ordered today. Pt provided with contact information and advised to call to schedule appt.   Bone Density: Completed 04/11/17. Results reflect OSTEOPOROSIS. Repeat every 2 years.   Lung Cancer Screening: (Low Dose CT Chest recommended if Age 40-80 years, 30 pack-year currently smoking OR have quit w/in 15years.) does not qualify.    Additional Screening:  Hepatitis C Screening: does qualify; Completed 02/22/17  Vision Screening: Recommended annual ophthalmology exams for early detection of glaucoma and other disorders of the eye. Is the patient up to date with their annual eye exam?  Yes  Who is the provider or what is the name of the office in which the pt attends annual eye exams? Dr. Ellin Mayhew   Dental Screening: Recommended annual dental exams for proper oral hygiene  Community Resource Referral:  CRR required this visit?  No      Plan:    I have personally reviewed and addressed the Medicare Annual Wellness questionnaire and have noted the following in the patient's chart:  A. Medical and social history B. Use of alcohol, tobacco or illicit drugs  C. Current medications and supplements D. Functional ability and status E.  Nutritional status F.  Physical activity G. Advance directives H. List of other physicians I.  Hospitalizations, surgeries, and ER visits in previous 12 months J.  [place] such as hearing and vision if needed, cognitive and depression L. Referrals and appointments   In addition, I have reviewed and discussed with patient certain  preventive protocols, quality metrics, and best practice recommendations. A written personalized care plan for preventive services as well as general preventive health recommendations were provided to patient.   Signed,  Clemetine Marker, LPN Nurse Health Advisor   Nurse Notes:pt discussed caregiver stress today due to caring for her 29 year old mother. She can only leave her for short periods and is the primary caregiver for her include meals, bathing, dressing, etc. Pt seeing Jolene tomorrow to discuss her current anti depressant and coping methods for stress.

## 2018-02-28 ENCOUNTER — Encounter: Payer: Self-pay | Admitting: Nurse Practitioner

## 2018-02-28 ENCOUNTER — Ambulatory Visit (INDEPENDENT_AMBULATORY_CARE_PROVIDER_SITE_OTHER): Payer: Medicare Other | Admitting: Nurse Practitioner

## 2018-02-28 ENCOUNTER — Ambulatory Visit: Payer: Self-pay

## 2018-02-28 VITALS — BP 130/71 | HR 83 | Temp 98.5°F | Ht 66.0 in | Wt 188.0 lb

## 2018-02-28 DIAGNOSIS — E559 Vitamin D deficiency, unspecified: Secondary | ICD-10-CM

## 2018-02-28 DIAGNOSIS — Z1329 Encounter for screening for other suspected endocrine disorder: Secondary | ICD-10-CM

## 2018-02-28 DIAGNOSIS — E538 Deficiency of other specified B group vitamins: Secondary | ICD-10-CM

## 2018-02-28 DIAGNOSIS — K582 Mixed irritable bowel syndrome: Secondary | ICD-10-CM | POA: Diagnosis not present

## 2018-02-28 DIAGNOSIS — Z0000 Encounter for general adult medical examination without abnormal findings: Secondary | ICD-10-CM | POA: Diagnosis not present

## 2018-02-28 DIAGNOSIS — E781 Pure hyperglyceridemia: Secondary | ICD-10-CM

## 2018-02-28 DIAGNOSIS — F332 Major depressive disorder, recurrent severe without psychotic features: Secondary | ICD-10-CM

## 2018-02-28 MED ORDER — DULOXETINE HCL 60 MG PO CPEP: 60.0000 mg | ORAL_CAPSULE | Freq: Every day | ORAL | 1 refills | Status: DC

## 2018-02-28 MED ORDER — BUSPIRONE HCL 5 MG PO TABS: 5.0000 mg | ORAL_TABLET | Freq: Two times a day (BID) | ORAL | 3 refills | Status: DC

## 2018-02-28 NOTE — Assessment & Plan Note (Signed)
Lipid panel today.  Continues to monitor diet.  No medication at this time.

## 2018-02-28 NOTE — Assessment & Plan Note (Signed)
Continues on daily supplement.  Vit D level today.

## 2018-02-28 NOTE — Assessment & Plan Note (Signed)
Chronic, stable without medications.

## 2018-02-28 NOTE — Patient Instructions (Signed)
Buspirone tablets What is this medicine? BUSPIRONE (byoo SPYE rone) is used to treat anxiety disorders. This medicine may be used for other purposes; ask your health care provider or pharmacist if you have questions. COMMON BRAND NAME(S): BuSpar What should I tell my health care provider before I take this medicine? They need to know if you have any of these conditions: -kidney or liver disease -an unusual or allergic reaction to buspirone, other medicines, foods, dyes, or preservatives -pregnant or trying to get pregnant -breast-feeding How should I use this medicine? Take this medicine by mouth with a glass of water. Follow the directions on the prescription label. You may take this medicine with or without food. To ensure that this medicine always works the same way for you, you should take it either always with or always without food. Take your doses at regular intervals. Do not take your medicine more often than directed. Do not stop taking except on the advice of your doctor or health care professional. Talk to your pediatrician regarding the use of this medicine in children. Special care may be needed. Overdosage: If you think you have taken too much of this medicine contact a poison control center or emergency room at once. NOTE: This medicine is only for you. Do not share this medicine with others. What if I miss a dose? If you miss a dose, take it as soon as you can. If it is almost time for your next dose, take only that dose. Do not take double or extra doses. What may interact with this medicine? Do not take this medicine with any of the following medications: -linezolid -MAOIs like Carbex, Eldepryl, Marplan, Nardil, and Parnate -methylene blue -procarbazine This medicine may also interact with the following medications: -diazepam -digoxin -diltiazem -erythromycin -grapefruit juice -haloperidol -medicines for mental depression or mood problems -medicines for seizures like  carbamazepine, phenobarbital and phenytoin -nefazodone -other medications for anxiety -rifampin -ritonavir -some antifungal medicines like itraconazole, ketoconazole, and voriconazole -verapamil -warfarin This list may not describe all possible interactions. Give your health care provider a list of all the medicines, herbs, non-prescription drugs, or dietary supplements you use. Also tell them if you smoke, drink alcohol, or use illegal drugs. Some items may interact with your medicine. What should I watch for while using this medicine? Visit your doctor or health care professional for regular checks on your progress. It may take 1 to 2 weeks before your anxiety gets better. You may get drowsy or dizzy. Do not drive, use machinery, or do anything that needs mental alertness until you know how this drug affects you. Do not stand or sit up quickly, especially if you are an older patient. This reduces the risk of dizzy or fainting spells. Alcohol can make you more drowsy and dizzy. Avoid alcoholic drinks. What side effects may I notice from receiving this medicine? Side effects that you should report to your doctor or health care professional as soon as possible: -blurred vision or other vision changes -chest pain -confusion -difficulty breathing -feelings of hostility or anger -muscle aches and pains -numbness or tingling in hands or feet -ringing in the ears -skin rash and itching -vomiting -weakness Side effects that usually do not require medical attention (report to your doctor or health care professional if they continue or are bothersome): -disturbed dreams, nightmares -headache -nausea -restlessness or nervousness -sore throat and nasal congestion -stomach upset This list may not describe all possible side effects. Call your doctor for medical advice about side   effects. You may report side effects to FDA at 1-800-FDA-1088. Where should I keep my medicine? Keep out of the reach  of children. Store at room temperature below 30 degrees C (86 degrees F). Protect from light. Keep container tightly closed. Throw away any unused medicine after the expiration date. NOTE: This sheet is a summary. It may not cover all possible information. If you have questions about this medicine, talk to your doctor, pharmacist, or health care provider.  2019 Elsevier/Gold Standard (2009-09-08 18:06:11) Sertraline tablets What is this medicine? SERTRALINE (SER tra leen) is used to treat depression. It may also be used to treat obsessive compulsive disorder, panic disorder, post-trauma stress, premenstrual dysphoric disorder (PMDD) or social anxiety. This medicine may be used for other purposes; ask your health care provider or pharmacist if you have questions. COMMON BRAND NAME(S): Zoloft What should I tell my health care provider before I take this medicine? They need to know if you have any of these conditions: -bleeding disorders -bipolar disorder or a family history of bipolar disorder -glaucoma -heart disease -high blood pressure -history of irregular heartbeat -history of low levels of calcium, magnesium, or potassium in the blood -if you often drink alcohol -liver disease -receiving electroconvulsive therapy -seizures -suicidal thoughts, plans, or attempt; a previous suicide attempt by you or a family member -take medicines that treat or prevent blood clots -thyroid disease -an unusual or allergic reaction to sertraline, other medicines, foods, dyes, or preservatives -pregnant or trying to get pregnant -breast-feeding How should I use this medicine? Take this medicine by mouth with a glass of water. Follow the directions on the prescription label. You can take it with or without food. Take your medicine at regular intervals. Do not take your medicine more often than directed. Do not stop taking this medicine suddenly except upon the advice of your doctor. Stopping this medicine  too quickly may cause serious side effects or your condition may worsen. A special MedGuide will be given to you by the pharmacist with each prescription and refill. Be sure to read this information carefully each time. Talk to your pediatrician regarding the use of this medicine in children. While this drug may be prescribed for children as young as 7 years for selected conditions, precautions do apply. Overdosage: If you think you have taken too much of this medicine contact a poison control center or emergency room at once. NOTE: This medicine is only for you. Do not share this medicine with others. What if I miss a dose? If you miss a dose, take it as soon as you can. If it is almost time for your next dose, take only that dose. Do not take double or extra doses. What may interact with this medicine? Do not take this medicine with any of the following medications: -cisapride -dofetilide -dronedarone -linezolid -MAOIs like Carbex, Eldepryl, Marplan, Nardil, and Parnate -methylene blue (injected into a vein) -pimozide -thioridazine This medicine may also interact with the following medications: -alcohol -amphetamines -aspirin and aspirin-like medicines -certain medicines for depression, anxiety, or psychotic disturbances -certain medicines for fungal infections like ketoconazole, fluconazole, posaconazole, and itraconazole -certain medicines for irregular heart beat like flecainide, quinidine, propafenone -certain medicines for migraine headaches like almotriptan, eletriptan, frovatriptan, naratriptan, rizatriptan, sumatriptan, zolmitriptan -certain medicines for sleep -certain medicines for seizures like carbamazepine, valproic acid, phenytoin -certain medicines that treat or prevent blood clots like warfarin, enoxaparin, dalteparin -cimetidine -digoxin -diuretics -fentanyl -isoniazid -lithium -NSAIDs, medicines for pain and inflammation, like ibuprofen or naproxen -other  medicines that prolong the QT interval (cause an abnormal heart rhythm) -rasagiline -safinamide -supplements like St. John's wort, kava kava, valerian -tolbutamide -tramadol -tryptophan This list may not describe all possible interactions. Give your health care provider a list of all the medicines, herbs, non-prescription drugs, or dietary supplements you use. Also tell them if you smoke, drink alcohol, or use illegal drugs. Some items may interact with your medicine. What should I watch for while using this medicine? Tell your doctor if your symptoms do not get better or if they get worse. Visit your doctor or health care professional for regular checks on your progress. Because it may take several weeks to see the full effects of this medicine, it is important to continue your treatment as prescribed by your doctor. Patients and their families should watch out for new or worsening thoughts of suicide or depression. Also watch out for sudden changes in feelings such as feeling anxious, agitated, panicky, irritable, hostile, aggressive, impulsive, severely restless, overly excited and hyperactive, or not being able to sleep. If this happens, especially at the beginning of treatment or after a change in dose, call your health care professional. Dennis Bast may get drowsy or dizzy. Do not drive, use machinery, or do anything that needs mental alertness until you know how this medicine affects you. Do not stand or sit up quickly, especially if you are an older patient. This reduces the risk of dizzy or fainting spells. Alcohol may interfere with the effect of this medicine. Avoid alcoholic drinks. Your mouth may get dry. Chewing sugarless gum or sucking hard candy, and drinking plenty of water may help. Contact your doctor if the problem does not go away or is severe. What side effects may I notice from receiving this medicine? Side effects that you should report to your doctor or health care professional as soon  as possible: -allergic reactions like skin rash, itching or hives, swelling of the face, lips, or tongue -anxious -black, tarry stools -changes in vision -confusion -elevated mood, decreased need for sleep, racing thoughts, impulsive behavior -eye pain -fast, irregular heartbeat -feeling faint or lightheaded, falls -feeling agitated, angry, or irritable -hallucination, loss of contact with reality -loss of balance or coordination -loss of memory -painful or prolonged erections -restlessness, pacing, inability to keep still -seizures -stiff muscles -suicidal thoughts or other mood changes -trouble sleeping -unusual bleeding or bruising -unusually weak or tired -vomiting Side effects that usually do not require medical attention (report to your doctor or health care professional if they continue or are bothersome): -change in appetite or weight -change in sex drive or performance -diarrhea -increased sweating -indigestion, nausea -tremors This list may not describe all possible side effects. Call your doctor for medical advice about side effects. You may report side effects to FDA at 1-800-FDA-1088. Where should I keep my medicine? Keep out of the reach of children. Store at room temperature between 15 and 30 degrees C (59 and 86 degrees F). Throw away any unused medicine after the expiration date. NOTE: This sheet is a summary. It may not cover all possible information. If you have questions about this medicine, talk to your doctor, pharmacist, or health care provider.  2019 Elsevier/Gold Standard (2016-02-03 14:17:49)

## 2018-02-28 NOTE — Assessment & Plan Note (Signed)
Continues on daily supplement.  B12 level today.

## 2018-02-28 NOTE — Progress Notes (Signed)
BP 130/71 (BP Location: Right Arm, Patient Position: Sitting, Cuff Size: Large)   Pulse 83   Temp 98.5 F (36.9 C)   Ht 5\' 6"  (1.676 m)   Wt 188 lb (85.3 kg)   LMP  (LMP Unknown)   SpO2 96%   BMI 30.34 kg/m    Subjective:    Patient ID: Marie Perry, female    DOB: 18-Oct-1950, 68 y.o.   MRN: [id number]  HPI: Marie Perry is a 68 y.o. female presenting on 02/28/2018 for comprehensive medical examination. Current medical complaints include:none  She currently lives with: Menopausal Symptoms: no   HYPERLIPIDEMIA Hyperlipidemia status: no current medications Satisfied with current treatment?  no current medications Aspirin:  no The 10-year ASCVD risk score [name] DC Jr., et al., 2013) is: 6.7%   Values used to calculate the score:     Age: 57 years     Sex: Female     Is Non-Hispanic African American: No     Diabetic: No     Tobacco smoker: No     Systolic Blood Pressure: 009 mmHg     Is BP treated: No     HDL Cholesterol: 53 mg/dL     Total Cholesterol: 215 mg/dL Chest pain:  no Coronary artery disease:  no Family history CAD:  no Family history early CAD:  no   VITAMIN D & B12 DEFICIENCY: Continues on supplements daily.  No recent levels.  States "I don't know if I am taking what I need to".  IBS: She reports this is stable.  Denies need for use of medication at this time.  States this has improved with use of Duloxetine and overall improvement in mood over years.  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  She endorses increased anxiety at times as a caregiver.  Her siblings do come down once a month to help her and encourage her to get out of the house. Mood status: controlled Satisfied with current treatment?: yes Symptom severity: mild  Duration of current treatment : years Side effects: no Medication compliance: good compliance Psychotherapy/counseling: none Previous psychiatric medications:  Duloxetine Depressed mood: yes Anxious mood: yes Anhedonia: no Significant weight loss or gain: no Insomnia: yes hard to stay asleep Fatigue: yes Feelings of worthlessness or guilt: no Impaired concentration/indecisiveness: yes Suicidal ideations: no Hopelessness: no Crying spells: yes  Depression Screen done today and results listed below:  Depression screen [facility] 2/9 02/27/2018 08/30/2017 02/22/2017 02/22/2017 04/03/2016  Decreased Interest 1 1 1 1  0  Down, Depressed, Hopeless 3 1 1 1 1   PHQ - 2 Score 4 2 2 2 1   Altered sleeping 2 1 1 1  -  Tired, decreased energy 3 2 1 1  -  Change in appetite 2 1 0 0 -  Feeling bad or failure about yourself  2 1 0 0 -  Trouble concentrating 0 1 1 1  -  Moving slowly or fidgety/restless 1 0 0 0 -  Suicidal thoughts 0 0 0 0 -  PHQ-9 Score 14 8 5 5  -  Difficult doing work/chores Somewhat difficult Not difficult at all Not difficult at all - -    The patient does not have a history of falls. I did not complete a risk assessment for falls. A plan of care for falls was not documented.   Past Medical History:  Past Medical History:  Diagnosis Date  . Allergy   . Anxiety   .  Arthritis   . Cataract 02/2016   Yearly eye exam w/Dr. Ellin Mayhew  . 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 Depression   . GERD (gastroesophageal reflux disease)   . Hypertriglyceridemia   . IBS (irritable bowel syndrome)   . IFG (impaired fasting glucose)   . Osteopenia   . Sleep apnea   . Vitamin D deficiency     Surgical History:  Past Surgical History:  Procedure Laterality Date  . bright's surgery    . COLONOSCOPY N/A 09/17/2016   Procedure: COLONOSCOPY;  Surgeon: Lollie Sails, MD;  Location: [facility] ENDOSCOPY;  Service: Endoscopy;  Laterality: N/A;  . COLONOSCOPY WITH PROPOFOL N/A 04/15/2017   Procedure: COLONOSCOPY WITH PROPOFOL;  Surgeon: Lollie Sails, MD;  Location: [facility] ENDOSCOPY;  Service: Endoscopy;  Laterality: N/A;  . SKIN TAG  REMOVAL     rectal    Medications:  Current Outpatient Medications on File Prior to Visit  Medication Sig  . Cholecalciferol (VITAMIN D PO) Take by mouth daily.  . Cyanocobalamin (B-12 PO) Take by mouth daily.   No current facility-administered medications on file prior to visit.     Allergies:  No Known Allergies  Social History:  Social History   Socioeconomic History  . Marital status: Single    Spouse name: Not on file  . Number of children: 0  . Years of education: Not on file  . Highest education level: Associate degree: academic program  Occupational History  . 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 Financial resource strain: Not very hard  . Food insecurity:    Worry: Never true    Inability: Never true  . Transportation needs:    Medical: No    Non-medical: No  Tobacco Use  . Smoking status: Never Smoker  . Smokeless tobacco: Never Used  Substance and Sexual Activity  . Alcohol use: Yes    Alcohol/week: 0.0 standard drinks    Comment: rarely indulge  . Drug use: Never  . Sexual activity: Never  Lifestyle  . Physical activity:    Days per week: 0 days    Minutes per session: 0 min  . Stress: Not at all  Relationships  . Social connections:    Talks on phone: Three times a week    Gets together: Once a week    Attends religious service: More than 4 times per year    Active member of club or organization: No    Attends meetings of clubs or organizations: Never    Relationship status: Divorced  . Intimate partner violence:    Fear of current or ex partner: No    Emotionally abused: No    Physically abused: No    Forced sexual activity: No  Other Topics Concern  . Not on file  Social History Narrative  . Not on file   Social History   Tobacco Use  Smoking Status Never Smoker  Smokeless Tobacco Never Used   Social History   Substance and Sexual Activity  Alcohol Use Yes  . Alcohol/week: 0.0 standard drinks    Comment: rarely indulge    Family History:  Family History  Problem Relation Age of Onset  . Mental illness Mother   . Depression Mother   . Anxiety disorder Mother   . Hearing loss Mother   . Miscarriages / [place] Mother   . Vision loss Mother   . Stroke Father   .  Heart disease Father   . Hearing loss Father   . Vision loss Father   . Cancer Maternal Grandmother        ovarian  . Anemia Maternal Grandmother   . Arthritis Maternal Grandmother   . Lung disease Maternal Grandfather   . Birth defects Maternal Grandfather   . Asthma Maternal Grandfather   . Depression Sister   . Fibromyalgia Brother   . GER disease Brother   . Heart disease Paternal Grandmother   . Birth defects Paternal Grandfather   . Depression Brother   . Asthma Paternal Uncle   . Birth defects Sister   . Early death Sister   . Cancer Maternal Uncle   . Hearing loss Maternal Uncle   . Kidney disease Maternal Uncle   . Depression Brother   . Early death Paternal Uncle   . Hearing loss Maternal Aunt   . Vision loss Maternal Aunt   . Varicose Veins Maternal Aunt   . Heart disease Paternal Uncle   . Stroke Paternal Uncle   . Breast cancer Neg Hx     Past medical history, surgical history, medications, allergies, family history and social history reviewed with patient today and changes made to appropriate areas of the chart.   Review of Systems - Negative All other ROS negative except what is listed above and in the HPI.      Objective:    BP 130/71 (BP Location: Right Arm, Patient Position: Sitting, Cuff Size: Large)   Pulse 83   Temp 98.5 F (36.9 C)   Ht 5\' 6"  (1.676 m)   Wt 188 lb (85.3 kg)   LMP  (LMP Unknown)   SpO2 96%   BMI 30.34 kg/m   Wt Readings from Last 3 Encounters:  02/28/18 188 lb (85.3 kg)  02/27/18 199 lb 12.8 oz (90.6 kg)  08/30/17 188 lb (85.3 kg)    Physical Exam Vitals signs and nursing note reviewed.  Constitutional:      General: She is awake.      Appearance: She is well-developed.  HENT:     Head: Normocephalic.     Right Ear: Hearing, tympanic membrane, ear canal and external ear normal.     Left Ear: Hearing, tympanic membrane, ear canal and external ear normal.     Nose: Nose normal.     Right Sinus: No maxillary sinus tenderness or frontal sinus tenderness.     Left Sinus: No maxillary sinus tenderness or frontal sinus tenderness.     Mouth/Throat:     Mouth: Mucous membranes are moist.     Pharynx: Oropharynx is clear. No pharyngeal swelling, oropharyngeal exudate or posterior oropharyngeal erythema.  Eyes:     General: Lids are normal.        Right eye: No discharge.        Left eye: No discharge.     Conjunctiva/sclera: Conjunctivae normal.     Pupils: Pupils are equal, round, and reactive to light.  Neck:     Musculoskeletal: Normal range of motion and neck supple.     Thyroid: No thyromegaly.     Vascular: No carotid bruit or JVD.  Cardiovascular:     Rate and Rhythm: Normal rate and regular rhythm.     Heart sounds: Normal heart sounds. No murmur. No gallop.   Pulmonary:     Effort: Pulmonary effort is normal.     Breath sounds: Normal breath sounds.  Chest:     Breasts:  Right: Normal.        Left: Normal.  Abdominal:     General: Bowel sounds are normal.     Palpations: Abdomen is soft. There is no hepatomegaly or splenomegaly.  Musculoskeletal:     Right lower leg: No edema.     Left lower leg: No edema.  Lymphadenopathy:     Cervical: No cervical adenopathy.  Skin:    General: Skin is warm and dry.  Neurological:     Mental Status: She is alert and oriented to person, place, and time.  Psychiatric:        Attention and Perception: Attention normal.        Mood and Affect: Mood normal.        Behavior: Behavior normal. Behavior is cooperative.        Thought Content: Thought content normal.        Judgment: Judgment normal.     Results for orders placed or performed in visit on 02/22/17   Hepatitis C antibody screen  Result Value Ref Range   Hep C Virus Ab <0.1 0.0 - 0.9 s/co ratio  HIV antibody  Result Value Ref Range   HIV Screen 4th Generation wRfx Non Reactive Non Reactive  Comprehensive metabolic panel  Result Value Ref Range   Glucose 88 65 - 99 mg/dL   BUN 20 8 - 27 mg/dL   Creatinine, Ser 0.83 0.57 - 1.00 mg/dL   GFR calc non Af Amer 74 >59 mL/min/1.73   GFR calc Af Amer 86 >59 mL/min/1.73   BUN/Creatinine Ratio 24 12 - 28   Sodium 144 134 - 144 mmol/L   Potassium 4.2 3.5 - 5.2 mmol/L   Chloride 104 96 - 106 mmol/L   CO2 25 20 - 29 mmol/L   Calcium 9.8 8.7 - 10.3 mg/dL   Total Protein 7.1 6.0 - 8.5 g/dL   Albumin 4.7 3.6 - 4.8 g/dL   Globulin, Total 2.4 1.5 - 4.5 g/dL   Albumin/Globulin Ratio 2.0 1.2 - 2.2   Bilirubin Total 0.5 0.0 - 1.2 mg/dL   Alkaline Phosphatase 89 39 - 117 IU/L   AST 15 0 - 40 IU/L   ALT 11 0 - 32 IU/L  Lipid Panel w/o Chol/HDL Ratio  Result Value Ref Range   Cholesterol, Total 215 (H) 100 - 199 mg/dL   Triglycerides 138 0 - 149 mg/dL   HDL 53 >39 mg/dL   VLDL Cholesterol Cal 28 5 - 40 mg/dL   LDL Calculated 134 (H) 0 - 99 mg/dL      Assessment & Plan:   Problem List Items Addressed This Visit      Digestive   IBS (irritable bowel syndrome)    Chronic, stable without medications.        Other   Recurrent major depression-severe ([place])    Chronic with slightly worse symptoms, appears to be related to being primary caregiver to her mother and anxiety from long term role.  Discussed various option, including tapering off Cymbalta and adding in different medication (SSRI) or trialing Buspar twice a day.  At this time script sent for Buspar.  Will have patient return in 4 weeks for assessment.  Adjust medications as needed.      Relevant Medications   busPIRone (BUSPAR) 5 MG tablet   DULoxetine (CYMBALTA) 60 MG capsule   Vitamin D deficiency    Continues on daily supplement.  Vit D level today.      Relevant Orders  VITAMIN D 25 Hydroxy (Vit-D Deficiency, Fractures)   Hypertriglyceridemia    Lipid panel today.  Continues to monitor diet.  No medication at this time.      Relevant Orders   Lipid Panel w/o Chol/HDL Ratio   Vitamin B12 deficiency    Continues on daily supplement.  B12 level today.      Relevant Orders   Vitamin B12    Other Visit Diagnoses    Annual physical exam    -  Primary   Relevant Orders   CBC with Differential/Platelet   Comprehensive metabolic panel   Thyroid disorder screen       Relevant Orders   TSH       Follow up plan: Return in about 4 weeks (around 03/28/2018) for Depression.   LABORATORY TESTING:  - Pap smear: not applicable  IMMUNIZATIONS:   - Tdap: Tetanus vaccination status reviewed: last tetanus booster within 10 years. - Influenza: Up to date - Pneumovax: Up to date - Prevnar: Up to date - HPV: Not applicable - Zostavax vaccine: Up to date  SCREENING: -Mammogram: Up to date  - Colonoscopy: Up to date  - Bone Density: Up to date  -Hearing Test: not applicable -Spirometry: Not applicable   PATIENT COUNSELING:   Advised to take 1 mg of folate supplement per day if capable of pregnancy.   Sexuality: Discussed sexually transmitted diseases, partner selection, use of condoms, avoidance of unintended pregnancy  and contraceptive alternatives.   Advised to avoid cigarette smoking.  I discussed with the patient that most people either abstain from alcohol or drink within safe limits (<=14/week and <=4 drinks/occasion for males, <=7/weeks and <= 3 drinks/occasion for females) and that the risk for alcohol disorders and other health effects rises proportionally with the number of drinks per week and how often a drinker exceeds daily limits.  Discussed cessation/primary prevention of drug use and availability of treatment for abuse.   Diet: Encouraged to adjust caloric intake to maintain  or achieve ideal body weight, to reduce intake of dietary  saturated fat and total fat, to limit sodium intake by avoiding high sodium foods and not adding table salt, and to maintain adequate dietary potassium and calcium preferably from fresh fruits, vegetables, and low-fat dairy products.    stressed the importance of regular exercise  Injury prevention: Discussed safety belts, safety helmets, smoke detector, smoking near bedding or upholstery.   Dental health: Discussed importance of regular tooth brushing, flossing, and dental visits.    NEXT PREVENTATIVE PHYSICAL DUE IN 1 YEAR. Return in about 4 weeks (around 03/28/2018) for Depression.

## 2018-02-28 NOTE — Assessment & Plan Note (Signed)
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  Discussed various option, including tapering off Cymbalta and adding in different medication (SSRI) or trialing Buspar twice a day.  At this time script sent for Buspar.  Will have patient return in 4 weeks for assessment.  Adjust medications as needed.

## 2018-02-28 NOTE — Assessment & Plan Note (Signed)
Recommended regular exercise and health diet.

## 2018-03-01 LAB — COMPREHENSIVE METABOLIC PANEL
A/G RATIO: 1.8 (ref 1.2–2.2)
ALBUMIN: 4.2 g/dL (ref 3.6–4.8)
ALT: 20 IU/L (ref 0–32)
AST: 16 IU/L (ref 0–40)
Alkaline Phosphatase: 76 IU/L (ref 39–117)
BUN / CREAT RATIO: 16 (ref 12–28)
BUN: 18 mg/dL (ref 8–27)
Bilirubin Total: 0.4 mg/dL (ref 0.0–1.2)
CALCIUM: 9.8 mg/dL (ref 8.7–10.3)
CO2: 23 mmol/L (ref 20–29)
CREATININE: 1.1 mg/dL — AB (ref 0.57–1.00)
Chloride: 104 mmol/L (ref 96–106)
GFR, EST AFRICAN AMERICAN: 60 mL/min/{1.73_m2} (ref >59)
GFR, EST NON AFRICAN AMERICAN: 52 mL/min/{1.73_m2} — AB (ref >59)
GLOBULIN, TOTAL: 2.4 g/dL (ref 1.5–4.5)
Glucose: 84 mg/dL (ref 65–99)
POTASSIUM: 4.4 mmol/L (ref 3.5–5.2)
SODIUM: 140 mmol/L (ref 134–144)
TOTAL PROTEIN: 6.6 g/dL (ref 6.0–8.5)

## 2018-03-01 LAB — LIPID PANEL W/O CHOL/HDL RATIO
Cholesterol, Total: 210 mg/dL — ABNORMAL HIGH (ref 100–199)
HDL: 41 mg/dL (ref >39)
LDL CALC: 105 mg/dL — AB (ref 0–99)
TRIGLYCERIDES: 320 mg/dL — AB (ref 0–149)
VLDL Cholesterol Cal: 64 mg/dL — ABNORMAL HIGH (ref 5–40)

## 2018-03-01 LAB — CBC WITH DIFFERENTIAL/PLATELET
BASOS: 0 %
Basophils Absolute: 0 10*3/uL (ref 0.0–0.2)
EOS (ABSOLUTE): 0.2 10*3/uL (ref 0.0–0.4)
EOS: 2 %
HEMATOCRIT: 39.9 % (ref 34.0–46.6)
HEMOGLOBIN: 13.4 g/dL (ref 11.1–15.9)
IMMATURE GRANS (ABS): 0 10*3/uL (ref 0.0–0.1)
IMMATURE GRANULOCYTES: 0 %
Lymphocytes Absolute: 2.4 10*3/uL (ref 0.7–3.1)
Lymphs: 26 %
MCH: 30 pg (ref 26.6–33.0)
MCHC: 33.6 g/dL (ref 31.5–35.7)
MCV: 90 fL (ref 79–97)
MONOCYTES: 7 %
MONOS ABS: 0.6 10*3/uL (ref 0.1–0.9)
NEUTROS PCT: 65 %
Neutrophils Absolute: 6 10*3/uL (ref 1.4–7.0)
Platelets: 223 10*3/uL (ref 150–450)
RBC: 4.46 x10E6/uL (ref 3.77–5.28)
RDW: 13.2 % (ref 11.7–15.4)
WBC: 9.3 10*3/uL (ref 3.4–10.8)

## 2018-03-01 LAB — VITAMIN B12: Vitamin B-12: 1587 pg/mL — ABNORMAL HIGH (ref 232–1245)

## 2018-03-01 LAB — VITAMIN D 25 HYDROXY (VIT D DEFICIENCY, FRACTURES): VIT D 25 HYDROXY: 28.5 ng/mL — AB (ref 30.0–100.0)

## 2018-03-01 LAB — TSH: TSH: 1.7 u[IU]/mL (ref 0.450–4.500)

## 2018-03-28 ENCOUNTER — Telehealth: Payer: Self-pay | Admitting: Nurse Practitioner

## 2018-03-28 MED ORDER — DULOXETINE HCL 60 MG PO CPEP: 60.0000 mg | ORAL_CAPSULE | Freq: Every day | ORAL | 1 refills | Status: DC

## 2018-03-28 NOTE — Telephone Encounter (Signed)
Patient wants to know if she is to continue the Buspar, I told her to continue until her appointment since you did not mention her discontinuing this drug.

## 2018-03-28 NOTE — Telephone Encounter (Signed)
Yes, that is correct. Thank you

## 2018-03-28 NOTE — Telephone Encounter (Signed)
Copied from [place] [id number]. Topic: General - Other >> Mar 28, 2018  1:28 PM Mcneil, Ja-Kwan wrote: Reason for CRM: Pt stated she will be out of the DULoxetine (CYMBALTA) 60 MG capsule before her appt and she would like to know if pcp wants her to continue the medication. Pt request call back. Cb# [phone]

## 2018-04-04 ENCOUNTER — Ambulatory Visit: Payer: Medicare Other | Admitting: Nurse Practitioner

## 2018-04-07 ENCOUNTER — Other Ambulatory Visit: Payer: Self-pay

## 2018-04-07 ENCOUNTER — Ambulatory Visit (INDEPENDENT_AMBULATORY_CARE_PROVIDER_SITE_OTHER): Payer: Medicare Other | Admitting: Nurse Practitioner

## 2018-04-07 ENCOUNTER — Encounter: Payer: Self-pay | Admitting: Nurse Practitioner

## 2018-04-07 DIAGNOSIS — F332 Major depressive disorder, recurrent severe without psychotic features: Secondary | ICD-10-CM

## 2018-04-07 NOTE — Progress Notes (Signed)
BP 134/79   Pulse 84   Temp 98.6 F (37 C) (Oral)   Ht 5\' 6"  (1.676 m)   Wt 195 lb (88.5 kg)   LMP  (LMP Unknown)   SpO2 96%   BMI 31.47 kg/m    Subjective:    Patient ID: Marie Perry, female    DOB: 10/23/1950, 68 y.o.   MRN: [id number]  HPI: Scotty Pinder is a 68 y.o. female  Chief Complaint  Patient presents with  . 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  Currently on Cymbalta 60 MG and at last visit added Buspar 5 MG twice a day. Mood status: stable Satisfied with current treatment?: yes Symptom severity: mild  Duration of current treatment : chronic Side effects: no Medication compliance: good compliance Psychotherapy/counseling: none Depressed mood: yes, at times Anxious mood: no Anhedonia: no Significant weight loss or gain: no Insomnia: yes hard to fall asleep, at times Fatigue: no Feelings of worthlessness or guilt: no Impaired concentration/indecisiveness: no Suicidal ideations: no Hopelessness: no Crying spells: yes, occasional Depression screen [facility] 2/9 04/07/2018 02/27/2018 08/30/2017 02/22/2017 02/22/2017  Decreased Interest 2 1 1 1 1   Down, Depressed, Hopeless 2 3 1 1 1   PHQ - 2 Score 4 4 2 2 2   Altered sleeping 1 2 1 1 1   Tired, decreased energy 2 3 2 1 1   Change in appetite 1 2 1  0 0  Feeling bad or failure about yourself  1 2 1  0 0  Trouble concentrating 1 0 1 1 1   Moving slowly or fidgety/restless 0 1 0 0 0  Suicidal thoughts 0 0 0 0 0  PHQ-9 Score 10 14 8 5 5   Difficult doing work/chores Not difficult at all Somewhat difficult Not difficult at all Not difficult at all -    Relevant past medical, surgical, family and social history reviewed and updated as indicated. Interim medical history since our last visit reviewed. Allergies and medications reviewed and updated.  Review of Systems  Constitutional: Negative for activity change, appetite change, diaphoresis, fatigue and  fever.  Respiratory: Negative for cough, chest tightness and shortness of breath.   Cardiovascular: Negative for chest pain, palpitations and leg swelling.  Gastrointestinal: Negative for abdominal distention, abdominal pain, constipation, diarrhea, nausea and vomiting.  Endocrine: Negative for cold intolerance, heat intolerance, polydipsia, polyphagia and polyuria.  Neurological: Negative for dizziness, syncope, weakness, light-headedness, numbness and headaches.  Psychiatric/Behavioral: Positive for sleep disturbance. Negative for confusion and decreased concentration. The patient is not nervous/anxious.     Per HPI unless specifically indicated above     Objective:    BP 134/79   Pulse 84   Temp 98.6 F (37 C) (Oral)   Ht 5\' 6"  (1.676 m)   Wt 195 lb (88.5 kg)   LMP  (LMP Unknown)   SpO2 96%   BMI 31.47 kg/m   Wt Readings from Last 3 Encounters:  04/07/18 195 lb (88.5 kg)  02/28/18 188 lb (85.3 kg)  02/27/18 199 lb 12.8 oz (90.6 kg)    Physical Exam Vitals signs and nursing note reviewed.  Constitutional:      Appearance: She is well-developed.  HENT:     Head: Normocephalic.  Eyes:     General:        Right eye: No discharge.        Left eye: No discharge.     Conjunctiva/sclera: Conjunctivae normal.  Pupils: Pupils are equal, round, and reactive to light.  Neck:     Musculoskeletal: Normal range of motion and neck supple.     Thyroid: No thyromegaly.     Vascular: No carotid bruit or JVD.  Cardiovascular:     Rate and Rhythm: Normal rate and regular rhythm.     Heart sounds: Normal heart sounds.  Pulmonary:     Effort: Pulmonary effort is normal.     Breath sounds: Normal breath sounds.  Abdominal:     General: Bowel sounds are normal.     Palpations: Abdomen is soft.  Lymphadenopathy:     Cervical: No cervical adenopathy.  Skin:    General: Skin is warm and dry.  Neurological:     Mental Status: She is alert and oriented to person, place, and time.    Psychiatric:        Attention and Perception: Attention normal.        Mood and Affect: Mood normal.        Speech: Speech normal.        Behavior: Behavior normal.        Thought Content: Thought content normal.        Judgment: Judgment normal.     Comments: Occasional tearfulness upon talking about her mother.     Results for orders placed or performed in visit on 02/28/18  CBC with Differential/Platelet  Result Value Ref Range   WBC 9.3 3.4 - 10.8 x10E3/uL   RBC 4.46 3.77 - 5.28 x10E6/uL   Hemoglobin 13.4 11.1 - 15.9 g/dL   Hematocrit 39.9 34.0 - 46.6 %   MCV 90 79 - 97 fL   MCH 30.0 26.6 - 33.0 pg   MCHC 33.6 31.5 - 35.7 g/dL   RDW 13.2 11.7 - 15.4 %   Platelets 223 150 - 450 x10E3/uL   Neutrophils 65 Not Estab. %   Lymphs 26 Not Estab. %   Monocytes 7 Not Estab. %   Eos 2 Not Estab. %   Basos 0 Not Estab. %   Neutrophils Absolute 6.0 1.4 - 7.0 x10E3/uL   Lymphocytes Absolute 2.4 0.7 - 3.1 x10E3/uL   Monocytes Absolute 0.6 0.1 - 0.9 x10E3/uL   EOS (ABSOLUTE) 0.2 0.0 - 0.4 x10E3/uL   Basophils Absolute 0.0 0.0 - 0.2 x10E3/uL   Immature Granulocytes 0 Not Estab. %   Immature Grans (Abs) 0.0 0.0 - 0.1 x10E3/uL  Comprehensive metabolic panel  Result Value Ref Range   Glucose 84 65 - 99 mg/dL   BUN 18 8 - 27 mg/dL   Creatinine, Ser 1.10 (H) 0.57 - 1.00 mg/dL   GFR calc non Af Amer 52 (L) >59 mL/min/1.73   GFR calc Af Amer 60 >59 mL/min/1.73   BUN/Creatinine Ratio 16 12 - 28   Sodium 140 134 - 144 mmol/L   Potassium 4.4 3.5 - 5.2 mmol/L   Chloride 104 96 - 106 mmol/L   CO2 23 20 - 29 mmol/L   Calcium 9.8 8.7 - 10.3 mg/dL   Total Protein 6.6 6.0 - 8.5 g/dL   Albumin 4.2 3.6 - 4.8 g/dL   Globulin, Total 2.4 1.5 - 4.5 g/dL   Albumin/Globulin Ratio 1.8 1.2 - 2.2   Bilirubin Total 0.4 0.0 - 1.2 mg/dL   Alkaline Phosphatase 76 39 - 117 IU/L   AST 16 0 - 40 IU/L   ALT 20 0 - 32 IU/L  Lipid Panel w/o Chol/HDL Ratio  Result Value Ref Range  Cholesterol, Total 210  (H) 100 - 199 mg/dL   Triglycerides 320 (H) 0 - 149 mg/dL   HDL 41 >39 mg/dL   VLDL Cholesterol Cal 64 (H) 5 - 40 mg/dL   LDL Calculated 105 (H) 0 - 99 mg/dL  TSH  Result Value Ref Range   TSH 1.700 0.450 - 4.500 uIU/mL  VITAMIN D 25 Hydroxy (Vit-D Deficiency, Fractures)  Result Value Ref Range   Vit D, 25-Hydroxy 28.5 (L) 30.0 - 100.0 ng/mL  Vitamin B12  Result Value Ref Range   Vitamin B-12 1,587 (H) 232 - 1,245 pg/mL      Assessment & Plan:   Problem List Items Addressed This Visit      Other   Recurrent major depression-severe ([facility])    Chronic, ongoing.  Slight improvement in mood on exam today and PHQ9 = 10.  Will continue Buspar and Cymbalta.  She agrees to chronic care management referral with LCSW for counseling.  Care team made aware.  Return in 6 months for follow-up.          Follow up plan: Return in about 6 months (around 10/06/2018) for Depression.

## 2018-04-07 NOTE — Patient Instructions (Signed)
To reduce your LDL, Remember - more fruits and vegetables, more fish, and limit red meat and dairy products.  More soy, nuts, beans, barley, lentils, oats and plant sterol ester enriched margarine instead of butter.  I also encourage eliminating sugar and processed food.  Remember, shop on the outside of the grocery store and visit your [employer].   If you would like to talk with me about dietary changes plus or minus medications for your cholesterol, please let me know. We should recheck your cholesterol in 6 months.    Fat and Cholesterol Restricted Eating Plan Getting too much fat and cholesterol in your diet may cause health problems. Choosing the right foods helps keep your fat and cholesterol at normal levels. This can keep you from getting certain diseases. Your doctor may recommend an eating plan that includes:  Total fat: ______% or less of total calories a day.  Saturated fat: ______% or less of total calories a day.  Cholesterol: less than _________mg a day.  Fiber: ______g a day. What are tips for following this plan? Meal planning  At meals, divide your plate into four equal parts: ? Fill one-half of your plate with vegetables and green salads. ? Fill one-fourth of your plate with whole grains. ? Fill one-fourth of your plate with low-fat (lean) protein foods.  Eat fish that is high in omega-3 fats at least two times a week. This includes mackerel, tuna, sardines, and salmon.  Eat foods that are high in fiber, such as whole grains, beans, apples, broccoli, carrots, peas, and barley. General tips   Work with your doctor to lose weight if you need to.  Avoid: ? Foods with added sugar. ? Fried foods. ? Foods with partially hydrogenated oils.  Limit alcohol intake to no more than 1 drink a day for nonpregnant women and 2 drinks a day for men. One drink equals 12 oz of beer, 5 oz of wine, or 1 oz of hard liquor. Reading food labels  Check food labels for: ? Trans  fats. ? Partially hydrogenated oils. ? Saturated fat (g) in each serving. ? Cholesterol (mg) in each serving. ? Fiber (g) in each serving.  Choose foods with healthy fats, such as: ? Monounsaturated fats. ? Polyunsaturated fats. ? Omega-3 fats.  Choose grain products that have whole grains. Look for the word "whole" as the first word in the ingredient list. Cooking  Cook foods using low-fat methods. These include baking, boiling, grilling, and broiling.  Eat more home-cooked foods. Eat at restaurants and buffets less often.  Avoid cooking using saturated fats, such as butter, cream, palm oil, palm kernel oil, and coconut oil. Recommended foods  Fruits  All fresh, canned (in natural juice), or frozen fruits. Vegetables  Fresh or frozen vegetables (raw, steamed, roasted, or grilled). Green salads. Grains  Whole grains, such as whole wheat or whole grain breads, crackers, cereals, and pasta. Unsweetened oatmeal, bulgur, barley, quinoa, or brown rice. Corn or whole wheat flour tortillas. Meats and other protein foods  Ground beef (85% or leaner), grass-fed beef, or beef trimmed of fat. Skinless chicken or [place]. Ground chicken or [place]. Pork trimmed of fat. All fish and seafood. Egg whites. Dried beans, peas, or lentils. Unsalted nuts or seeds. Unsalted canned beans. Nut butters without added sugar or oil. Dairy  Low-fat or nonfat dairy products, such as skim or 1% milk, 2% or reduced-fat cheeses, low-fat and fat-free ricotta or cottage cheese, or plain low-fat and nonfat yogurt. Fats and  oils  Tub margarine without trans fats. Light or reduced-fat mayonnaise and salad dressings. Avocado. Olive, canola, sesame, or safflower oils. The items listed above may not be a complete list of foods and beverages you can eat. Contact a dietitian for more information. Foods to avoid Fruits  Canned fruit in heavy syrup. Fruit in cream or butter sauce. Fried fruit. Vegetables  Vegetables  cooked in cheese, cream, or butter sauce. Fried vegetables. Grains  White bread. White pasta. White rice. Cornbread. Bagels, pastries, and croissants. Crackers and snack foods that contain trans fat and hydrogenated oils. Meats and other protein foods  Fatty cuts of meat. Ribs, chicken wings, bacon, sausage, bologna, salami, chitterlings, fatback, hot dogs, bratwurst, and packaged lunch meats. Liver and organ meats. Whole eggs and egg yolks. Chicken and [place] with skin. Fried meat. Dairy  Whole or 2% milk, cream, half-and-half, and cream cheese. Whole milk cheeses. Whole-fat or sweetened yogurt. Full-fat cheeses. Nondairy creamers and whipped toppings. Processed cheese, cheese spreads, and cheese curds. Beverages  Alcohol. Sugar-sweetened drinks such as sodas, lemonade, and fruit drinks. Fats and oils  Butter, stick margarine, lard, shortening, ghee, or bacon fat. Coconut, palm kernel, and palm oils. Sweets and desserts  Corn syrup, sugars, honey, and molasses. Candy. Jam and jelly. Syrup. Sweetened cereals. Cookies, pies, cakes, donuts, muffins, and ice cream. The items listed above may not be a complete list of foods and beverages you should avoid. Contact a dietitian for more information. Summary  Choosing the right foods helps keep your fat and cholesterol at normal levels. This can keep you from getting certain diseases.  At meals, fill one-half of your plate with vegetables and green salads.  Eat high-fiber foods, like whole grains, beans, apples, carrots, peas, and barley.  Limit added sugar, saturated fats, alcohol, and fried foods. This information is not intended to replace advice given to you by your health care provider. Make sure you discuss any questions you have with your health care provider. Document Released: 07/31/2011 Document Revised: 10/02/2017 Document Reviewed: 10/16/2016 Elsevier Interactive Patient Education  2019 [employer].

## 2018-04-07 NOTE — Assessment & Plan Note (Signed)
Chronic, ongoing.  Slight improvement in mood on exam today and PHQ9 = 10.  Will continue Buspar and Cymbalta.  She agrees to chronic care management referral with LCSW for counseling.  Care team made aware.  Return in 6 months for follow-up.

## 2018-04-08 ENCOUNTER — Ambulatory Visit: Payer: Self-pay | Admitting: *Deleted

## 2018-04-08 DIAGNOSIS — F332 Major depressive disorder, recurrent severe without psychotic features: Secondary | ICD-10-CM

## 2018-04-08 DIAGNOSIS — K589 Irritable bowel syndrome without diarrhea: Secondary | ICD-10-CM

## 2018-04-08 DIAGNOSIS — E781 Pure hyperglyceridemia: Secondary | ICD-10-CM

## 2018-04-08 NOTE — Chronic Care Management (AMB) (Signed)
  Care Management Note   Marie Perry is a 68 y.o. year old female who sees Cannady, Henrine Screws T, NP for primary care. [name] asked the CCM team to consult the patient for assistance with chronic disease management related to depression and caregiver stress.   Review of patient status, including review of consultants reports, relevant laboratory and other test results, and collaboration with appropriate care team members and the patient's provider was performed as part of comprehensive patient evaluation and provision of chronic care management services. Telephone outreach to patient today to introduce CCM services.   I reached out to [name] by phone today. We had a very pleasant conversation and [name] expressed her gratitude for our outreach. She understands that the care management team LCSW will begin work in the practice in the coming weeks and will reach out to her upon her arrival for the purpose of providing support to her regarding management of depression and caregiver stress. In addition, [name] will be provided support around long term management of Hypertriglyceridemia.   Ms. How was given information about Chronic Care Management services today including:  1. CCM service includes personalized support from designated clinical staff supervised by her physician, including individualized plan of care and coordination with other care providers 2. 24/7 contact phone numbers for assistance for urgent and routine care needs. 3. Service will only be billed when office clinical staff spend 20 minutes or more in a month to coordinate care. 4. Only one practitioner may furnish and bill the service in a calendar month. 5. The patient may stop CCM services at any time (effective at the end of the month) by phone call to the office staff. 6. The patient will be responsible for cost sharing (co-pay) of up to 20% of the service fee (after annual deductible is  met).  Patient agreed to services and verbal consent obtained.    Follow Up Plan: Telephone follow up appointment with CCM team member scheduled for: 04/14/18  Janalyn Shy MHA,BSN,RN,CCM Nurse Care Coordinator [facility] / [facility] Care Management [phone]

## 2018-04-08 NOTE — Patient Instructions (Signed)
Visit Information  Goals    . "I wouldn't mind having some extra help managing my depression and stress" (pt-stated)     Current Barriers:  [name] Knowledge Deficits related to long term strategies for management of depression and caregiver stress  Nurse Case Manager Clinical Goal(s):  [name] Over the next 90 days, patient will verbalize basic understanding of depression/stress process and self health management plan as evidenced by her participation in development of long term plan of care and institution of self health management strategies  Interventions:  . Evaluation of current treatment plan related to depression/caregiver stress and patient's adherence to plan as established by provider. Nash Dimmer with PCP regarding patient care needs around caregiver stress and depression . Advised patient that LCSW will begin work in the practice in the coming weeks and will be able to meet with her one on one.   Patient Self Care Activities:  . Performs ADL's independently . Calls provider office for new concerns or questions . Attends all scheduled provider appointments  Plan:  . Patient will call provider practice for new needs/concerns. [name] RNCM will collaborate with LCSW upon her arrival to the practice regarding Mrs. Zetina's neeeds   *initial goal documentation      Education or Materials Provided:  . None - telephone interview  Ms. Basso was given information about Chronic Care Management services today including:  1. CCM service includes personalized support from designated clinical staff supervised by her physician, including individualized plan of care and coordination with other care providers 2. 24/7 contact phone numbers for assistance for urgent and routine care needs. 3. Service will only be billed when office clinical staff spend 20 minutes or more in a month to coordinate care. 4. Only one practitioner may furnish and bill the service in a calendar month. 5. The patient  may stop CCM services at any time (effective at the end of the month) by phone call to the office staff. 6. The patient will be responsible for cost sharing (co-pay) of up to 20% of the service fee (after annual deductible is met).  Patient agreed to services and verbal consent obtained.   The patient verbalized understanding of instructions provided today and declined a print copy of patient instruction materials.   Telephone follow up appointment with CCM team member scheduled for: 04/14/18  Janalyn Shy MHA,BSN,RN,CCM Nurse Care Coordinator [facility] / [facility] Care Management [phone]

## 2018-04-24 ENCOUNTER — Telehealth: Payer: Self-pay

## 2018-05-01 ENCOUNTER — Ambulatory Visit (INDEPENDENT_AMBULATORY_CARE_PROVIDER_SITE_OTHER): Payer: Medicare Other | Admitting: Licensed Clinical Social Worker

## 2018-05-01 DIAGNOSIS — F332 Major depressive disorder, recurrent severe without psychotic features: Secondary | ICD-10-CM | POA: Diagnosis not present

## 2018-05-01 NOTE — Chronic Care Management (AMB) (Signed)
Care Management Note   Marie Perry is a 68 y.o. year old female who is a primary care patient of Cannady, Barbaraann Faster, NP. The CM team was consulted for assistance with chronic disease management and care coordination.   I reached out to Marie Perry by phone today to provide mental health education, support and resources. LCSW was able to reach patient successfully and HIPPA verifications were received. Patient reports that her anxiety has increased in view of the COVID virus. LCSW provided education on self care methods that patient can implement into her daily routine to combat anxiety and depression. Patient shares that at this time, she is not interested in gaining face to face therapy but is agreeable to LCSW following up with her telephonically as needed.   Review of patient status, including review of consultants reports, relevant laboratory and other test results, and collaboration with appropriate care team members and the patient's provider was performed as part of comprehensive patient evaluation and provision of chronic care management services.  Goals Addressed    . "I wouldn't mind having some extra help managing my depression and stress" (pt-stated)       Current Barriers:  [name] Knowledge Deficits related to long term strategies for management of depression and caregiver stress . Lacks caregiver support. 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   Case Manager Clinical Goal(s):  [name] Over the next 90 days, patient will verbalize basic understanding of depression/stress process and self health management plan as evidenced by her participation in development of long term plan of care and institution of self health management strategies . Over the next 90 days, patient will be educated on healthy coping skills and will implement these appropriate self care methods into her daily routine to combat depression/anxiety. . Over the next 90 days, patient will be educated  on appropriate mental health resources that are available to her as well as socialization opportunities.   Interventions:  . Evaluation of current treatment plan related to depression/caregiver stress and patient's adherence to plan as established by provider. . Education provided on available mental health resources within the area . Education on appropriate self-care methods to implement in order to combat ongoing depression and anxiety.  [name] LCSW reviewed available socialization opportunities that are available to patient.   Patient Self Care Activities:  . Performs ADL's independently . Calls provider office for new concerns or questions . Attends all scheduled provider appointments  Plan:  . Patient will call provider practice for new needs/concerns. [name] LCSW will follow up with patient within 28 days to provide ongoing mental health resource education and support   Please see past updates related to this goal by clicking on the "Past Updates" button in the selected goal       GAD 7 : Generalized Anxiety Score 05/01/2018  Nervous, Anxious, on Edge 2  Control/stop worrying 2  Worry too much - different things 2  Trouble relaxing 1  Restless 1  Easily annoyed or irritable 0  Afraid - awful might happen 1  Total GAD 7 Score 9  Anxiety Difficulty Somewhat difficult   Depression screen [facility] 2/9 05/01/2018 04/07/2018 02/27/2018 08/30/2017 02/22/2017  Decreased Interest 2 2 1 1 1   Down, Depressed, Hopeless 2 2 3 1 1   PHQ - 2 Score 4 4 4 2 2   Altered sleeping 1 1 2 1 1   Tired, decreased energy 2 2 3 2 1   Change in appetite 1 1 2  1  0  Feeling bad or failure about yourself  1 1 2 1  0  Trouble concentrating 0 1 0 1 1  Moving slowly or fidgety/restless 0 0 1 0 0  Suicidal thoughts 0 0 0 0 0  PHQ-9 Score 9 10 14 8 5   Difficult doing work/chores Not difficult at all Not difficult at all Somewhat difficult Not difficult at all Not difficult at all   Follow Up Plan: The patient has been  provided with contact information for the chronic care management team and has been advised to call with any health related questions or concerns. LCSW will follow up with patient within 28 days.  Eula Fried, BSW, MSW, [facility] Care Management [email or web address] Phone: [phone]

## 2018-05-01 NOTE — Patient Instructions (Signed)
Licensed Clinical Social Worker Visit Information  Goals we discussed today:  Goals Addressed            This Visit's Progress   . "I wouldn't mind having some extra help managing my depression and stress" (pt-stated)       Current Barriers:  [name] Knowledge Deficits related to long term strategies for management of depression and caregiver stress . Lacks caregiver support. 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   Case Manager Clinical Goal(s):  [name] Over the next 90 days, patient will verbalize basic understanding of depression/stress process and self health management plan as evidenced by her participation in development of long term plan of care and institution of self health management strategies . Over the next 90 days, patient will be educated on healthy coping skills and will implement these appropriate self care methods into her daily routine to combat depression/anxiety. . Over the next 90 days, patient will be educated on appropriate mental health resources that are available to her as well as socialization opportunities.   Interventions:  . Evaluation of current treatment plan related to depression/caregiver stress and patient's adherence to plan as established by provider. .   Patient Self Care Activities:  . Performs ADL's independently . Calls provider office for new concerns or questions . Attends all scheduled provider appointments  Plan:  . Patient will call provider practice for new needs/concerns. [name] LCSW willfollow up with patient within 28 days to provide ongoing mental health resource education and support   Please see past updates related to this goal by clicking on the "Past Updates" button in the selected goal          Materials provided: No: Patient declined    The patient verbalized understanding of instructions provided today and declined a print copy of patient instruction materials.   Follow up plan: SW will follow up with patient by  phone over the next 28 days   Eula Fried, [place], MSW, [email or web address] Phone: [phone]

## 2018-05-26 ENCOUNTER — Other Ambulatory Visit: Payer: Self-pay

## 2018-05-26 ENCOUNTER — Ambulatory Visit (INDEPENDENT_AMBULATORY_CARE_PROVIDER_SITE_OTHER): Payer: Medicare Other | Admitting: Licensed Clinical Social Worker

## 2018-05-26 DIAGNOSIS — F332 Major depressive disorder, recurrent severe without psychotic features: Secondary | ICD-10-CM | POA: Diagnosis not present

## 2018-05-26 NOTE — Patient Instructions (Signed)
Licensed Clinical Social Worker Visit Information  Goals we discussed today:  Goals Addressed    . "I wouldn't mind having some extra help managing my depression and stress" (pt-stated)       Current Barriers:  [name] Knowledge Deficits related to long term strategies for management of depression and caregiver stress . Lacks caregiver support. 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   Case Manager Clinical Goal(s):  [name] Over the next 90 days, patient will verbalize basic understanding of depression/stress process and self health management plan as evidenced by her participation in development of long term plan of care and institution of self health management strategies . Over the next 90 days, patient will be educated on healthy coping skills and will implement these appropriate self care methods into her daily routine to combat depression/anxiety. . Over the next 90 days, patient will be educated on appropriate mental health resources that are available to her as well as socialization opportunities.   Interventions:  . Evaluation of current treatment plan related to depression/caregiver stress and patient's adherence to plan as established by provider. . Patient interviewed and appropriate assessments performed . Provided mental health counseling with regard to depression/caregiver stress. Patient reports "I've been managing my symptoms better. It has been an adjustment process with this virus."  . Discussed plans with patient for ongoing care management follow up and provided patient with direct contact information for care management team . Advised patient to contact LCSW for any ugrent social work needs . Assisted patient/caregiver with obtaining information about health plan benefits  Patient Self Care Activities:  . Performs ADL's independently . Calls provider office for new concerns or questions . Attends all scheduled provider appointments  Plan:  . Patient will call  provider practice for new needs/concerns. [name] LCSW willfollow up with patient within 28 days to provide ongoing mental health resource education and support  Please see past updates related to this goal by clicking on the "Past Updates" button in the selected goal        Materials Provided: Verbal education about caregiver support resources provided by phone  Follow Up Plan: SW will follow up with patient by phone over the next 28 days  Eula Fried, [place], MSW, [email or web address] Phone: [phone]

## 2018-05-26 NOTE — Chronic Care Management (AMB) (Signed)
  Chronic Care Management    Clinical Social Work General Note  05/26/2018 Name: Marie Perry MRN: [id number] DOB: 19-Feb-1950  [name] is a 68 y.o. year old female who is a primary care patient of Cannady, Barbaraann Faster, NP. The CCM was consulted to assist the patient with Mental Health Counseling and Resources and Caregiver Stress.   Review of patient status, including review of consultants reports, relevant laboratory and other test results, and collaboration with appropriate care team members and the patient's provider was performed as part of comprehensive patient evaluation and provision of chronic care management services.    Goals Addressed    . "I wouldn't mind having some extra help managing my depression and stress" (pt-stated)       Current Barriers:  [name] Knowledge Deficits related to long term strategies for management of depression and caregiver stress . Lacks caregiver support. 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   Case Manager Clinical Goal(s):  [name] Over the next 90 days, patient will verbalize basic understanding of depression/stress process and self health management plan as evidenced by her participation in development of long term plan of care and institution of self health management strategies . Over the next 90 days, patient will be educated on healthy coping skills and will implement these appropriate self care methods into her daily routine to combat depression/anxiety. . Over the next 90 days, patient will be educated on appropriate mental health resources that are available to her as well as socialization opportunities.   Interventions:  . Evaluation of current treatment plan related to depression/caregiver stress and patient's adherence to plan as established by provider. . Patient interviewed and appropriate assessments performed . Provided mental health counseling with regard to depression/caregiver stress. Patient reports "I've been  managing my symptoms better. It has been an adjustment process with this virus."  . Discussed plans with patient for ongoing care management follow up and provided patient with direct contact information for care management team . Advised patient to contact LCSW for any ugrent social work needs . Assisted patient/caregiver with obtaining information about health plan benefits  Patient Self Care Activities:  . Performs ADL's independently . Calls provider office for new concerns or questions . Attends all scheduled provider appointments  Plan:  . Patient will call provider practice for new needs/concerns. [name] LCSW willfollow up with patient within 28 days to provide ongoing mental health resource education and support  Please see past updates related to this goal by clicking on the "Past Updates" button in the selected goal     Follow Up Plan: SW will follow up with patient by phone over the next 28 days      Eula Fried, [place], MSW, [email or web address] Phone: [phone]

## 2018-06-23 ENCOUNTER — Telehealth: Payer: Self-pay

## 2018-06-27 ENCOUNTER — Ambulatory Visit (INDEPENDENT_AMBULATORY_CARE_PROVIDER_SITE_OTHER): Payer: Medicare Other | Admitting: Licensed Clinical Social Worker

## 2018-06-27 ENCOUNTER — Other Ambulatory Visit: Payer: Self-pay

## 2018-06-27 DIAGNOSIS — F332 Major depressive disorder, recurrent severe without psychotic features: Secondary | ICD-10-CM

## 2018-06-27 NOTE — Chronic Care Management (AMB) (Signed)
  Chronic Care Management    Clinical Social Work General Note  06/27/2018 Name: Nadean Montanaro MRN: [id number] DOB: January 03, 1951  Marie Perry is a 68 y.o. year old female who is a primary care patient of Cannady, Barbaraann Faster, NP. The CCM was consulted to assist the patient with Mental Health Counseling and Resources.   Review of patient status, including review of consultants reports, relevant laboratory and other test results, and collaboration with appropriate care team members and the patient's provider was performed as part of comprehensive patient evaluation and provision of chronic care management services.    Goals Addressed    . "I wouldn't mind having some extra help managing my depression and stress" (pt-stated)       Current Barriers:  [name] Knowledge Deficits related to long term strategies for management of depression and caregiver stress . Lacks caregiver support. 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   Case Manager Clinical Goal(s):  [name] Over the next 90 days, patient will verbalize basic understanding of depression/stress process and self health management plan as evidenced by her participation in development of long term plan of care and institution of self health management strategies . Over the next 90 days, patient will be educated on healthy coping skills and will implement these appropriate self care methods into her daily routine to combat depression/anxiety. . Over the next 90 days, patient will be educated on appropriate mental health resources that are available to her as well as socialization opportunities.   Interventions:  . Evaluation of current treatment plan related to depression/caregiver stress and patient's adherence to plan as established by provider. . Patient interviewed and appropriate assessments performed . Provided mental health counseling with regard to depression/caregiver stress. Patient reports "I'm not sure if my medications are  helping me." Patient questions if increasing her psych medications dosage would help her manger her anxiety/depression . LCSW collaborated with PCP in regards to patient's medication regimen questions . Education provided on how to implement appropriate self-care coping tools to combat triggers and stress. Patient receptive to education and reports implementing appropriate self-care by reading and spending time outside.  . Discussed plans with patient for ongoing care management follow up and provided patient with direct contact information for care management team . Advised patient to contact LCSW for any ugrent social work needs . Assisted patient/caregiver with obtaining information about health plan benefits  Patient Self Care Activities:  . Performs ADL's independently . Calls provider office for new concerns or questions . Attends all scheduled provider appointments  Plan:  . Patient will call provider practice for new needs/concerns. [name] LCSW willfollow up with patient within 28 days to provide ongoing mental health resource education and support  Please see past updates related to this goal by clicking on the "Past Updates" button in the selected goal   Eula Fried, [place], MSW, [email or web address] Phone: [phone]       Follow Up Plan: SW will follow up with patient by phone over the next 30 days      Eula Fried, [place], MSW, [email or web address] Phone: [phone]

## 2018-06-30 ENCOUNTER — Ambulatory Visit (INDEPENDENT_AMBULATORY_CARE_PROVIDER_SITE_OTHER): Payer: Medicare Other | Admitting: Nurse Practitioner

## 2018-06-30 ENCOUNTER — Other Ambulatory Visit: Payer: Self-pay | Admitting: Nurse Practitioner

## 2018-06-30 ENCOUNTER — Encounter: Payer: Self-pay | Admitting: Nurse Practitioner

## 2018-06-30 ENCOUNTER — Other Ambulatory Visit: Payer: Self-pay

## 2018-06-30 DIAGNOSIS — F332 Major depressive disorder, recurrent severe without psychotic features: Secondary | ICD-10-CM

## 2018-06-30 MED ORDER — DULOXETINE HCL 60 MG PO CPEP: 60.0000 mg | ORAL_CAPSULE | Freq: Every day | ORAL | 1 refills | Status: DC

## 2018-06-30 MED ORDER — BUSPIRONE HCL 10 MG PO TABS: 10.0000 mg | ORAL_TABLET | Freq: Two times a day (BID) | ORAL | 3 refills | Status: DC

## 2018-06-30 NOTE — Patient Instructions (Signed)

## 2018-06-30 NOTE — Progress Notes (Signed)
LMP  (LMP Unknown)    Subjective:    Patient ID: Marie Perry, female    DOB: Mar 12, 1950, 68 y.o.   MRN: [id number]  HPI: Marie Perry is a 68 y.o. female  Chief Complaint  Patient presents with  . Depression    . This visit was completed via D[employer] due to the restrictions of the COVID-19 pandemic. All issues as above were discussed and addressed. Physical exam was done as above through visual confirmation on Doximity. If it was felt that the patient should be evaluated in the office, they were directed there. The patient verbally consented to this visit. . Location of the patient: home . Location of the provider: home . Those involved with this call:  . Provider: [name], DNP . CMA: Tiffany Reel, CMA . Front Desk/Registration: Jill Side  . Time spent on call: 15 minutes with patient face to face via video conference. More than 50% of this time was spent in counseling and coordination of care. 10 minutes total spent in review of patient's record and preparation of their chart. I verified patient identity using two factors (patient name and date of birth). Patient consents verbally to being seen via telemedicine visit today.   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  Has ongoing issues with anxiety/depression due to caregiver strain.  Current medications include Cymbalta 60 MG and Buspar 5 MG BID.  She reports she is only getting out house right now to check mailbox due to the Covid pandemic.  Endorses this has been a difficult time due to not being able to get out on occasion due to concern for exposing her mother.  Reports she has minimal socialization recently.  Discussed options for mood such switching to alternate maintenance medication (changing from Duloxetine to Sertraline) vs increasing Buspar dose for short period to help during this more situational time.  She would prefer trial of increase in Buspar.  Denies SI/HI. Mood  status: exacerbated Satisfied with current treatment?: yes Symptom severity: moderate  Duration of current treatment : chronic Side effects: no Medication compliance: good compliance Psychotherapy/counseling: currently with SW  Previous psychiatric medications: paxil and prozac Depressed mood: yes Anxious mood: yes Anhedonia: no Significant weight loss or gain: no Insomnia: yes hard to fall asleep at times due to occasional daytime napping Fatigue: no Feelings of worthlessness or guilt: no Impaired concentration/indecisiveness: yes Suicidal ideations: no Hopelessness: yes Crying spells: yes Depression screen [facility] 2/9 06/30/2018 05/01/2018 04/07/2018 02/27/2018 08/30/2017  Decreased Interest 1 2 2 1 1   Down, Depressed, Hopeless 1 2 2 3 1   PHQ - 2 Score 2 4 4 4 2   Altered sleeping 3 1 1 2 1   Tired, decreased energy 3 2 2 3 2   Change in appetite 3 1 1 2 1   Feeling bad or failure about yourself  1 1 1 2 1   Trouble concentrating 1 0 1 0 1  Moving slowly or fidgety/restless 1 0 0 1 0  Suicidal thoughts 0 0 0 0 0  PHQ-9 Score 14 9 10 14 8   Difficult doing work/chores Very difficult Not difficult at all Not difficult at all Somewhat difficult Not difficult at all    Relevant past medical, surgical, family and social history reviewed and updated as indicated. Interim medical history since our last visit reviewed. Allergies and medications reviewed and updated.  Review of Systems  Constitutional: Negative for activity change, appetite change, diaphoresis, fatigue and fever.  Respiratory: Negative for  cough, chest tightness and shortness of breath.   Cardiovascular: Negative for chest pain, palpitations and leg swelling.  Gastrointestinal: Negative for abdominal distention, abdominal pain, constipation, diarrhea, nausea and vomiting.  Psychiatric/Behavioral: Positive for decreased concentration and sleep disturbance. Negative for self-injury and suicidal ideas. The patient is  nervous/anxious.     Per HPI unless specifically indicated above     Objective:    LMP  (LMP Unknown)   Wt Readings from Last 3 Encounters:  04/07/18 195 lb (88.5 kg)  02/28/18 188 lb (85.3 kg)  02/27/18 199 lb 12.8 oz (90.6 kg)    Physical Exam Vitals signs and nursing note reviewed.  Constitutional:      General: She is awake. She is not in acute distress.    Appearance: She is well-developed. She is obese. She is not ill-appearing.  HENT:     Head: Normocephalic.     Right Ear: Hearing normal.     Left Ear: Hearing normal.  Eyes:     General: Lids are normal.        Right eye: No discharge.        Left eye: No discharge.     Conjunctiva/sclera: Conjunctivae normal.  Neck:     Musculoskeletal: Normal range of motion.  Cardiovascular:     Comments: Unable to auscultate due to virtual exam only Pulmonary:     Effort: Pulmonary effort is normal. No accessory muscle usage or respiratory distress.     Comments: Unable to auscultate due to virtual exam only Neurological:     Mental Status: She is alert and oriented to person, place, and time.  Psychiatric:        Attention and Perception: Attention normal.        Mood and Affect: Mood normal.        Behavior: Behavior normal. Behavior is cooperative.        Thought Content: Thought content normal.        Judgment: Judgment normal.     Comments: Very pleasant behavior and appropriate.  No episodes of tearfulness.     Results for orders placed or performed in visit on 02/28/18  CBC with Differential/Platelet  Result Value Ref Range   WBC 9.3 3.4 - 10.8 x10E3/uL   RBC 4.46 3.77 - 5.28 x10E6/uL   Hemoglobin 13.4 11.1 - 15.9 g/dL   Hematocrit 39.9 34.0 - 46.6 %   MCV 90 79 - 97 fL   MCH 30.0 26.6 - 33.0 pg   MCHC 33.6 31.5 - 35.7 g/dL   RDW 13.2 11.7 - 15.4 %   Platelets 223 150 - 450 x10E3/uL   Neutrophils 65 Not Estab. %   Lymphs 26 Not Estab. %   Monocytes 7 Not Estab. %   Eos 2 Not Estab. %   Basos 0 Not  Estab. %   Neutrophils Absolute 6.0 1.4 - 7.0 x10E3/uL   Lymphocytes Absolute 2.4 0.7 - 3.1 x10E3/uL   Monocytes Absolute 0.6 0.1 - 0.9 x10E3/uL   EOS (ABSOLUTE) 0.2 0.0 - 0.4 x10E3/uL   Basophils Absolute 0.0 0.0 - 0.2 x10E3/uL   Immature Granulocytes 0 Not Estab. %   Immature Grans (Abs) 0.0 0.0 - 0.1 x10E3/uL  Comprehensive metabolic panel  Result Value Ref Range   Glucose 84 65 - 99 mg/dL   BUN 18 8 - 27 mg/dL   Creatinine, Ser 1.10 (H) 0.57 - 1.00 mg/dL   GFR calc non Af Amer 52 (L) >59 mL/min/1.73   GFR calc Af  Amer 60 >59 mL/min/1.73   BUN/Creatinine Ratio 16 12 - 28   Sodium 140 134 - 144 mmol/L   Potassium 4.4 3.5 - 5.2 mmol/L   Chloride 104 96 - 106 mmol/L   CO2 23 20 - 29 mmol/L   Calcium 9.8 8.7 - 10.3 mg/dL   Total Protein 6.6 6.0 - 8.5 g/dL   Albumin 4.2 3.6 - 4.8 g/dL   Globulin, Total 2.4 1.5 - 4.5 g/dL   Albumin/Globulin Ratio 1.8 1.2 - 2.2   Bilirubin Total 0.4 0.0 - 1.2 mg/dL   Alkaline Phosphatase 76 39 - 117 IU/L   AST 16 0 - 40 IU/L   ALT 20 0 - 32 IU/L  Lipid Panel w/o Chol/HDL Ratio  Result Value Ref Range   Cholesterol, Total 210 (H) 100 - 199 mg/dL   Triglycerides 320 (H) 0 - 149 mg/dL   HDL 41 >39 mg/dL   VLDL Cholesterol Cal 64 (H) 5 - 40 mg/dL   LDL Calculated 105 (H) 0 - 99 mg/dL  TSH  Result Value Ref Range   TSH 1.700 0.450 - 4.500 uIU/mL  VITAMIN D 25 Hydroxy (Vit-D Deficiency, Fractures)  Result Value Ref Range   Vit D, 25-Hydroxy 28.5 (L) 30.0 - 100.0 ng/mL  Vitamin B12  Result Value Ref Range   Vitamin B-12 1,587 (H) 232 - 1,245 pg/mL      Assessment & Plan:   Problem List Items Addressed This Visit      Other   Recurrent major depression-severe (HCC)    Chronic, ongoing.  Some exacerbation at this time due to Covid pandemic and decrease in socialization.  Will trial increase in Buspar to 10 MG BID for short period until restrictions further lifted.  Continue Cymbalta, at max dose.  Continue collaboration with CCM team.  Return  in 4 weeks for follow-up or sooner if worsening condition.  If no improvement will consider gradual switch from Cymbalta to Sertraline.      Relevant Medications   busPIRone (BUSPAR) 10 MG tablet   DULoxetine (CYMBALTA) 60 MG capsule      I discussed the assessment and treatment plan with the patient. The patient was provided an opportunity to ask questions and all were answered. The patient agreed with the plan and demonstrated an understanding of the instructions.   The patient was advised to call back or seek an in-person evaluation if the symptoms worsen or if the condition fails to improve as anticipated.   I provided 15 minutes of time during this encounter.  Follow up plan: Return in about 4 weeks (around 07/28/2018) for Depression.

## 2018-06-30 NOTE — Assessment & Plan Note (Signed)
Chronic, ongoing.  Some exacerbation at this time due to Covid pandemic and decrease in socialization.  Will trial increase in Buspar to 10 MG BID for short period until restrictions further lifted.  Continue Cymbalta, at max dose.  Continue collaboration with CCM team.  Return in 4 weeks for follow-up or sooner if worsening condition.  If no improvement will consider gradual switch from Cymbalta to Sertraline.

## 2018-08-01 ENCOUNTER — Ambulatory Visit (INDEPENDENT_AMBULATORY_CARE_PROVIDER_SITE_OTHER): Payer: Medicare Other | Admitting: Licensed Clinical Social Worker

## 2018-08-01 ENCOUNTER — Other Ambulatory Visit: Payer: Self-pay

## 2018-08-01 DIAGNOSIS — F332 Major depressive disorder, recurrent severe without psychotic features: Secondary | ICD-10-CM

## 2018-08-01 NOTE — Chronic Care Management (AMB) (Signed)
  Chronic Care Management    Clinical Social Work Follow Up Note  08/01/2018 Name: Marie Perry MRN: [id number] DOB: 1950/09/19  Marie Perry is a 68 y.o. year old female who is a primary care patient of Cannady, Barbaraann Faster, NP. The CCM team was consulted for assistance with Mental Health Counseling and Resources.   Review of patient status, including review of consultants reports, other relevant assessments, and collaboration with appropriate care team members and the patient's provider was performed as part of comprehensive patient evaluation and provision of chronic care management services.     Goals Addressed    . "I wouldn't mind having some extra help managing my depression and stress" (pt-stated)       Current Barriers:  [name] Knowledge Deficits related to long term strategies for management of depression and caregiver stress . Lacks caregiver support. 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   Case Manager Clinical Goal(s):  [name] Over the next 90 days, patient will verbalize basic understanding of depression/stress process and self health management plan as evidenced by her participation in development of long term plan of care and institution of self health management strategies . Over the next 90 days, patient will be educated on healthy coping skills and will implement these appropriate self care methods into her daily routine to combat depression/anxiety. . Over the next 90 days, patient will be educated on appropriate mental health resources that are available to her as well as socialization opportunities.   Interventions:  . Evaluation of current treatment plan related to depression/caregiver stress and patient's adherence to plan as established by provider. . Patient interviewed and appropriate assessments performed . Provided mental health counseling with regard to depression/caregiver stress. Patient reports that her recent medication dosage increase is  helping with her anxiety but not with her depression. Patient shares that she continues to experience crying spells.  . Education provided on how to implement appropriate self-care coping tools to combat triggers and stress. Patient receptive to education and reports implementing appropriate self-care by reading and spending time outside.  . Discussed plans with patient for ongoing care management follow up and provided patient with direct contact information for care management team . Advised patient to contact LCSW for any ugrent social work needs . Assisted patient/caregiver with obtaining information about health plan benefits . Provided mental health counseling with regard to patient was emotional throughout phone call due to her daily difficulty with depression/anxiety and stress from being a caregiver. LCSW provided reflective listening and implemented appropriate interventions to help suppport patient and her emotional needs   Patient Self Care Activities:  . Performs ADL's independently . Calls provider office for new concerns or questions . Attends all scheduled provider appointments  Plan:  . Patient will call provider practice for new needs/concerns. [name] LCSW willfollow up with patient next month to provide ongoing mental health resource education and support  Please see past updates related to this goal by clicking on the "Past Updates" button in the selected goal     Follow Up Plan: SW will follow up with patient by phone over the next month  Eula Fried, [place], MSW, [email or web address] Phone: [phone]

## 2018-08-22 ENCOUNTER — Other Ambulatory Visit: Payer: Self-pay | Admitting: Nurse Practitioner

## 2018-09-05 ENCOUNTER — Other Ambulatory Visit: Payer: Self-pay

## 2018-09-05 ENCOUNTER — Ambulatory Visit (INDEPENDENT_AMBULATORY_CARE_PROVIDER_SITE_OTHER): Payer: Medicare Other | Admitting: Licensed Clinical Social Worker

## 2018-09-05 DIAGNOSIS — F332 Major depressive disorder, recurrent severe without psychotic features: Secondary | ICD-10-CM | POA: Diagnosis not present

## 2018-09-05 NOTE — Chronic Care Management (AMB) (Signed)
  Chronic Care Management    Clinical Social Work Follow Up Note  09/05/2018 Name: Marie Perry MRN: [id number] DOB: 11/30/1950  Marie Perry is a 68 y.o. year old female who is a primary care patient of Cannady, Barbaraann Faster, NP. The CCM team was consulted for assistance with Mental Health Counseling and Resources.   Review of patient status, including review of consultants reports, other relevant assessments, and collaboration with appropriate care team members and the patient's provider was performed as part of comprehensive patient evaluation and provision of chronic care management services.     Goals Addressed    . "I wouldn't mind having some extra help managing my depression and stress" (pt-stated)       Current Barriers:  [name] Knowledge Deficits related to long term strategies for management of depression and caregiver stress . Lacks caregiver support. 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   Case Manager Clinical Goal(s):  [name] Over the next 90 days, patient will verbalize basic understanding of depression/stress process and self health management plan as evidenced by her participation in development of long term plan of care and institution of self health management strategies . Over the next 90 days, patient will be educated on healthy coping skills and will implement these appropriate self care methods into her daily routine to combat depression/anxiety. . Over the next 90 days, patient will be educated on appropriate mental health resources that are available to her as well as socialization opportunities.   Interventions:  . Evaluation of current treatment plan related to depression/caregiver stress and patient's adherence to plan as established by provider. . Patient interviewed and appropriate assessments performed . Provided mental health counseling with regard to depression/caregiver stress. Patient reports that her recent medication dosage increase is  helping with her anxiety but not with her depression. Patient shares that she is not experiencing as many crying spells.  . Education provided on how to implement appropriate self-care coping tools to combat triggers and stress. Patient receptive to education and reports implementing appropriate self-care by reading and spending time outside.  . Discussed plans with patient for ongoing care management follow up and provided patient with direct contact information for care management team . Advised patient to contact LCSW for any ugrent social work needs . Assisted patient/caregiver with obtaining information about health plan benefits . Provided mental health counseling with regard to patient was emotional throughout phone call due to her daily difficulty with depression/anxiety and stress from being a caregiver. LCSW provided reflective listening and implemented appropriate interventions to help suppport patient and her emotional needs   Patient Self Care Activities:  . Performs ADL's independently . Calls provider office for new concerns or questions . Attends all scheduled provider appointments  Plan:  . Patient will call provider practice for new needs/concerns. [name] LCSW willfollow up with patient next month to provide ongoing mental health resource education and support  Please see past updates related to this goal by clicking on the "Past Updates" button in the selected goal    Follow Up Plan: SW will follow up with patient by phone over the next month   Eula Fried, [place], MSW, [email or web address] Phone: [phone]

## 2018-10-03 ENCOUNTER — Ambulatory Visit (INDEPENDENT_AMBULATORY_CARE_PROVIDER_SITE_OTHER): Payer: Medicare Other | Admitting: Licensed Clinical Social Worker

## 2018-10-03 ENCOUNTER — Other Ambulatory Visit: Payer: Self-pay

## 2018-10-03 DIAGNOSIS — F332 Major depressive disorder, recurrent severe without psychotic features: Secondary | ICD-10-CM

## 2018-10-03 NOTE — Chronic Care Management (AMB) (Signed)
Chronic Care Management    Clinical Social Work Follow Up Note  10/03/2018 Name: Marie Perry MRN: [id number] DOB: August 01, 1950  Marie Perry is a 68 y.o. year old female who is a primary care patient of Cannady, Barbaraann Faster, NP. The CCM team was consulted for assistance with Mental Health Counseling and Resources and Caregiver Stress.   Review of patient status, including review of consultants reports, other relevant assessments, and collaboration with appropriate care team members and the patient's provider was performed as part of comprehensive patient evaluation and provision of chronic care management services.    SDOH (Social Determinants of Health) screening performed today: Stress. See Care Plan for related entries.   Outpatient Encounter Medications as of 10/03/2018  Medication Sig  . busPIRone (BUSPAR) 10 MG tablet Take 1 tablet (10 mg total) by mouth 2 (two) times daily.  . Cholecalciferol (VITAMIN D PO) Take by mouth daily.  . Cyanocobalamin (B-12 PO) Take by mouth daily.  . DULoxetine (CYMBALTA) 60 MG capsule Take 1 capsule by mouth once daily   No facility-administered encounter medications on file as of 10/03/2018.      Goals Addressed    . "I wouldn't mind having some extra help managing my depression and stress" (pt-stated)       Current Barriers:  [name] Knowledge Deficits related to long term strategies for management of depression and caregiver stress . Lacks caregiver support. 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   Case Manager Clinical Goal(s):  [name] Over the next 90 days, patient will verbalize basic understanding of depression/stress process and self health management plan as evidenced by her participation in development of long term plan of care and institution of self health management strategies . Over the next 90 days, patient will be educated on healthy coping skills and will implement these appropriate self care methods into her daily  routine to combat depression/anxiety. . Over the next 90 days, patient will be educated on appropriate mental health resources that are available to her as well as socialization opportunities.   Interventions:  . Evaluation of current treatment plan related to depression/caregiver stress and patient's adherence to plan as established by provider. . Patient interviewed and appropriate assessments performed . Patient reports that her family member has been diagnosed with COVID. She shares that this has added on increased stress. Patient has a 6 month follow up appointment with PCP next week and prefers to do this appointment by phone instead of going into the office. . Collaboration with PCP-regarding next week's 6 month follow up appointment . Provided mental health counseling with regard to depression/caregiver stress. Patient reports that her recent medication dosage increase is helping with her anxiety but not with her depression. Patient shares that she is not experiencing as much crying spells as before which is a great relief to her as they were mentally draining for her to experience daily.  . Education provided on how to implement appropriate self-care coping tools to combat triggers and stress. Patient receptive to education and reports implementing appropriate self-care by reading and spending time outside.  . Discussed plans with patient for ongoing care management follow up and provided patient with direct contact information for care management team . Advised patient to contact LCSW for any ugrent social work needs . Assisted patient/caregiver with obtaining information about health plan benefits . Provided mental health counseling with regard to patient was emotional throughout phone call due to her daily difficulty with depression/anxiety and  stress from being a caregiver. LCSW provided reflective listening and implemented appropriate interventions to help suppport patient and her  emotional needs   Patient Self Care Activities:  . Performs ADL's independently . Calls provider office for new concerns or questions . Attends all scheduled provider appointments  Plan:  . Patient will call provider practice for new needs/concerns. [name] LCSW willfollow up with patient next month to provide ongoing mental health resource education and support  Please see past updates related to this goal by clicking on the "Past Updates" button in the selected goal         Follow Up Plan: SW will follow up with patient by phone over the next 60 days  Eula Fried, [place], MSW, [email or web address] Phone: [phone]

## 2018-10-10 ENCOUNTER — Encounter: Payer: Self-pay | Admitting: Nurse Practitioner

## 2018-10-10 ENCOUNTER — Ambulatory Visit (INDEPENDENT_AMBULATORY_CARE_PROVIDER_SITE_OTHER): Payer: Medicare Other | Admitting: Nurse Practitioner

## 2018-10-10 ENCOUNTER — Other Ambulatory Visit: Payer: Self-pay

## 2018-10-10 DIAGNOSIS — F332 Major depressive disorder, recurrent severe without psychotic features: Secondary | ICD-10-CM | POA: Diagnosis not present

## 2018-10-10 MED ORDER — DULOXETINE HCL 60 MG PO CPEP: 60.0000 mg | ORAL_CAPSULE | Freq: Every day | ORAL | 3 refills | Status: DC

## 2018-10-10 MED ORDER — BUSPIRONE HCL 10 MG PO TABS: 10.0000 mg | ORAL_TABLET | Freq: Two times a day (BID) | ORAL | 3 refills | Status: DC

## 2018-10-10 NOTE — Progress Notes (Signed)
LMP  (LMP Unknown)    Subjective:    Patient ID: Marie Perry, female    DOB: [birth year], 68 y.o.   MRN: [id number]  HPI: [name] is a 68 y.o. female  Chief Complaint  Patient presents with  . Depression    . This visit was completed via Dox[employer]ity due to the restrictions of the COVID-19 pandemic. All issues as above were discussed and addressed. Physical exam was done as above through visual confirmation on Doximity. If it was felt that the patient should be evaluated in the office, they were directed there. The patient verbally consented to this visit. . Location of the patient: home . Location of the provider: home . Those involved with this call:  . Provider: [name], DNP . CMA: Yvonna Alanis, CMA . Front Desk/Registration: Jill Side  . Time spent on call: 15 minutes with patient face to face via video conference. More than 50% of this time was spent in counseling and coordination of care. 10 minutes total spent in review of patient's record and preparation of their chart.  . I verified patient identity using two factors (patient name and date of birth). Patient consents verbally to being seen via telemedicine visit today.    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  Has ongoing issues with anxiety/depression due to caregiver strain.  Current medications include Cymbalta 60 MG and Buspar 10 MG BID (increased to this in May).  Endorses this has been a difficult time due to not being able to get out on occasion due to concern for exposing her mother.  Reports she has minimal socialization recently. States that increase in Buspar has helped, less tearful.  Would like to trial therapy, via telemedicine, discussed benefits of therapy with her. Mood status: stable Satisfied with current treatment?: yes Symptom severity: moderate  Duration of current treatment : chronic Side effects: no Medication compliance: good compliance  Psychotherapy/counseling: currently with the SW Previous psychiatric medications: Cymbalta and Buspar Depressed mood: yes Anxious mood: no Anhedonia: no Significant weight loss or gain: no Insomnia: at times has issues falling asleep Fatigue: no Feelings of worthlessness or guilt: no Impaired concentration/indecisiveness: yes Suicidal ideations: no Hopelessness: no Crying spells: at times, but reports these have improved with increase in Buspar Depression screen [facility] 2/9 10/10/2018 06/30/2018 05/01/2018 04/07/2018 02/27/2018  Decreased Interest 1 1 2 2 1   Down, Depressed, Hopeless 1 1 2 2 3   PHQ - 2 Score 2 2 4 4 4   Altered sleeping 2 3 1 1 2   Tired, decreased energy 3 3 2 2 3   Change in appetite 1 3 1 1 2   Feeling bad or failure about yourself  1 1 1 1 2   Trouble concentrating 1 1 0 1 0  Moving slowly or fidgety/restless 0 1 0 0 1  Suicidal thoughts 0 0 0 0 0  PHQ-9 Score 10 14 9 10 14   Difficult doing work/chores Somewhat difficult Very difficult Not difficult at all Not difficult at all Somewhat difficult  Some recent data might be hidden    Relevant past medical, surgical, family and social history reviewed and updated as indicated. Interim medical history since our last visit reviewed. Allergies and medications reviewed and updated.  Review of Systems  Constitutional: Negative for activity change, appetite change, diaphoresis, fatigue and fever.  Respiratory: Negative for cough, chest tightness and shortness of breath.   Cardiovascular: Negative for chest pain, palpitations and leg swelling.  Gastrointestinal: Negative  for abdominal distention, abdominal pain, constipation, diarrhea, nausea and vomiting.  Neurological: Negative for dizziness, syncope, weakness, light-headedness, numbness and headaches.  Psychiatric/Behavioral: Negative.     Per HPI unless specifically indicated above     Objective:    LMP  (LMP Unknown)   Wt Readings from Last 3 Encounters:  04/07/18  195 lb (88.5 kg)  02/28/18 188 lb (85.3 kg)  02/27/18 199 lb 12.8 oz (90.6 kg)    Physical Exam Vitals signs and nursing note reviewed.  Constitutional:      General: She is awake. She is not in acute distress.    Appearance: She is well-developed. She is not ill-appearing.  HENT:     Head: Normocephalic.     Right Ear: Hearing normal.     Left Ear: Hearing normal.  Eyes:     General: Lids are normal.        Right eye: No discharge.        Left eye: No discharge.     Conjunctiva/sclera: Conjunctivae normal.  Neck:     Musculoskeletal: Normal range of motion.  Cardiovascular:     Comments: Unable to auscultate due to virtual exam only  Pulmonary:     Effort: Pulmonary effort is normal. No accessory muscle usage or respiratory distress.     Comments: Unable to auscultate due to virtual exam only  Neurological:     Mental Status: She is alert and oriented to person, place, and time.  Psychiatric:        Attention and Perception: Attention normal.        Mood and Affect: Mood normal.        Behavior: Behavior normal. Behavior is cooperative.        Thought Content: Thought content normal.        Judgment: Judgment normal.     Results for orders placed or performed in visit on 02/28/18  CBC with Differential/Platelet  Result Value Ref Range   WBC 9.3 3.4 - 10.8 x10E3/uL   RBC 4.46 3.77 - 5.28 x10E6/uL   Hemoglobin 13.4 11.1 - 15.9 g/dL   Hematocrit 39.9 34.0 - 46.6 %   MCV 90 79 - 97 fL   MCH 30.0 26.6 - 33.0 pg   MCHC 33.6 31.5 - 35.7 g/dL   RDW 13.2 11.7 - 15.4 %   Platelets 223 150 - 450 x10E3/uL   Neutrophils 65 Not Estab. %   Lymphs 26 Not Estab. %   Monocytes 7 Not Estab. %   Eos 2 Not Estab. %   Basos 0 Not Estab. %   Neutrophils Absolute 6.0 1.4 - 7.0 x10E3/uL   Lymphocytes Absolute 2.4 0.7 - 3.1 x10E3/uL   Monocytes Absolute 0.6 0.1 - 0.9 x10E3/uL   EOS (ABSOLUTE) 0.2 0.0 - 0.4 x10E3/uL   Basophils Absolute 0.0 0.0 - 0.2 x10E3/uL   Immature Granulocytes  0 Not Estab. %   Immature Grans (Abs) 0.0 0.0 - 0.1 x10E3/uL  Comprehensive metabolic panel  Result Value Ref Range   Glucose 84 65 - 99 mg/dL   BUN 18 8 - 27 mg/dL   Creatinine, Ser 1.10 (H) 0.57 - 1.00 mg/dL   GFR calc non Af Amer 52 (L) >59 mL/min/1.73   GFR calc Af Amer 60 >59 mL/min/1.73   BUN/Creatinine Ratio 16 12 - 28   Sodium 140 134 - 144 mmol/L   Potassium 4.4 3.5 - 5.2 mmol/L   Chloride 104 96 - 106 mmol/L   CO2 23 20 -  29 mmol/L   Calcium 9.8 8.7 - 10.3 mg/dL   Total Protein 6.6 6.0 - 8.5 g/dL   Albumin 4.2 3.6 - 4.8 g/dL   Globulin, Total 2.4 1.5 - 4.5 g/dL   Albumin/Globulin Ratio 1.8 1.2 - 2.2   Bilirubin Total 0.4 0.0 - 1.2 mg/dL   Alkaline Phosphatase 76 39 - 117 IU/L   AST 16 0 - 40 IU/L   ALT 20 0 - 32 IU/L  Lipid Panel w/o Chol/HDL Ratio  Result Value Ref Range   Cholesterol, Total 210 (H) 100 - 199 mg/dL   Triglycerides 320 (H) 0 - 149 mg/dL   HDL 41 >39 mg/dL   VLDL Cholesterol Cal 64 (H) 5 - 40 mg/dL   LDL Calculated 105 (H) 0 - 99 mg/dL  TSH  Result Value Ref Range   TSH 1.700 0.450 - 4.500 uIU/mL  VITAMIN D 25 Hydroxy (Vit-D Deficiency, Fractures)  Result Value Ref Range   Vit D, 25-Hydroxy 28.5 (L) 30.0 - 100.0 ng/mL  Vitamin B12  Result Value Ref Range   Vitamin B-12 1,587 (H) 232 - 1,245 pg/mL      Assessment & Plan:   Problem List Items Addressed This Visit      Other   Recurrent major depression-severe ([place]) - Primary    Chronic, ongoing with improvement with increase in Buspar dosing.  Denies SI/HI.  Will continue current medication regimen at this time and adjust as needed.  Referral to therapy placed.  May consider switch from Cymbalta to Sertraline in future if exacerbation.  Return in 3 months or sooner if worsening.      Relevant Medications   busPIRone (BUSPAR) 10 MG tablet   DULoxetine (CYMBALTA) 60 MG capsule   Other Relevant Orders   Ambulatory referral to Psychology      I discussed the assessment and treatment plan  with the patient. The patient was provided an opportunity to ask questions and all were answered. The patient agreed with the plan and demonstrated an understanding of the instructions.   The patient was advised to call back or seek an in-person evaluation if the symptoms worsen or if the condition fails to improve as anticipated.   I provided 15 minutes of time during this encounter.  Follow up plan: Return in about 3 months (around 01/10/2019) for Mood follow-up.

## 2018-10-10 NOTE — Patient Instructions (Signed)
Living With Depression Everyone experiences occasional disappointment, sadness, and loss in their lives. When you are feeling down, blue, or sad for at least 2 weeks in a row, it may mean that you have depression. Depression can affect your thoughts and feelings, relationships, daily activities, and physical health. It is caused by changes in the way your brain functions. If you receive a diagnosis of depression, your health care provider will tell you which type of depression you have and what treatment options are available to you. If you are living with depression, there are ways to help you recover from it and also ways to prevent it from coming back. How to cope with lifestyle changes Coping with stress     Stress is your body's reaction to life changes and events, both good and bad. Stressful situations may include:  Getting married.  The death of a spouse.  Losing a job.  Retiring.  Having a baby. Stress can last just a few hours or it can be ongoing. Stress can play a major role in depression, so it is important to learn both how to cope with stress and how to think about it differently. Talk with your health care provider or a counselor if you would like to learn more about stress reduction. He or she may suggest some stress reduction techniques, such as:  Music therapy. This can include creating music or listening to music. Choose music that you enjoy and that inspires you.  Mindfulness-based meditation. This kind of meditation can be done while sitting or walking. It involves being aware of your normal breaths, rather than trying to control your breathing.  Centering prayer. This is a kind of meditation that involves focusing on a spiritual word or phrase. Choose a word, phrase, or sacred image that is meaningful to you and that brings you peace.  Deep breathing. To do this, expand your stomach and inhale slowly through your nose. Hold your breath for 3-5 seconds, then exhale  slowly, allowing your stomach muscles to relax.  Muscle relaxation. This involves intentionally tensing muscles then relaxing them. Choose a stress reduction technique that fits your lifestyle and personality. Stress reduction techniques take time and practice to develop. Set aside 5-15 minutes a day to do them. Therapists can offer training in these techniques. The training may be covered by some insurance plans. Other things you can do to manage stress include:  Keeping a stress diary. This can help you learn what triggers your stress and ways to control your response.  Understanding what your limits are and saying no to requests or events that lead to a schedule that is too full.  Thinking about how you respond to certain situations. You may not be able to control everything, but you can control how you react.  Adding humor to your life by watching funny films or TV shows.  Making time for activities that help you relax and not feeling guilty about spending your time this way.  Medicines Your health care provider may suggest certain medicines if he or she feels that they will help improve your condition. Avoid using alcohol and other substances that may prevent your medicines from working properly (may interact). It is also important to:  Talk with your pharmacist or health care provider about all the medicines that you take, their possible side effects, and what medicines are safe to take together.  Make it your goal to take part in all treatment decisions (shared decision-making). This includes giving input on   the side effects of medicines. It is best if shared decision-making with your health care provider is part of your total treatment plan. If your health care provider prescribes a medicine, you may not notice the full benefits of it for 4-8 weeks. Most people who are treated for depression need to be on medicine for at least 6-12 months after they feel better. If you are taking  medicines as part of your treatment, do not stop taking medicines without first talking to your health care provider. You may need to have the medicine slowly decreased (tapered) over time to decrease the risk of harmful side effects. Relationships Your health care provider may suggest family therapy along with individual therapy and drug therapy. While there may not be family problems that are causing you to feel depressed, it is still important to make sure your family learns as much as they can about your mental health. Having your family's support can help make your treatment successful. How to recognize changes in your condition Everyone has a different response to treatment for depression. Recovery from major depression happens when you have not had signs of major depression for two months. This may mean that you will start to:  Have more interest in doing activities.  Feel less hopeless than you did 2 months ago.  Have more energy.  Overeat less often, or have better or improving appetite.  Have better concentration. Your health care provider will work with you to decide the next steps in your recovery. It is also important to recognize when your condition is getting worse. Watch for these signs:  Having fatigue or low energy.  Eating too much or too little.  Sleeping too much or too little.  Feeling restless, agitated, or hopeless.  Having trouble concentrating or making decisions.  Having unexplained physical complaints.  Feeling irritable, angry, or aggressive. Get help as soon as you or your family members notice these symptoms coming back. How to get support and help from others How to talk with friends and family members about your condition  Talking to friends and family members about your condition can provide you with one way to get support and guidance. Reach out to trusted friends or family members, explain your symptoms to them, and let them know that you are  working with a health care provider to treat your depression. Financial resources Not all insurance plans cover mental health care, so it is important to check with your insurance carrier. If paying for co-pays or counseling services is a problem, search for a local or county mental health care center. They may be able to offer public mental health care services at low or no cost when you are not able to see a private health care provider. If you are taking medicine for depression, you may be able to get the generic form, which may be less expensive. Some makers of prescription medicines also offer help to patients who cannot afford the medicines they need. Follow these instructions at home:   Get the right amount and quality of sleep.  Cut down on using caffeine, tobacco, alcohol, and other potentially harmful substances.  Try to exercise, such as walking or lifting small weights.  Take over-the-counter and prescription medicines only as told by your health care provider.  Eat a healthy diet that includes plenty of vegetables, fruits, whole grains, low-fat dairy products, and lean protein. Do not eat a lot of foods that are high in solid fats, added sugars, or salt.    Keep all follow-up visits as told by your health care provider. This is important. Contact a health care provider if:  You stop taking your antidepressant medicines, and you have any of these symptoms: ? Nausea. ? Headache. ? Feeling lightheaded. ? Chills and body aches. ? Not being able to sleep (insomnia).  You or your friends and family think your depression is getting worse. Get help right away if:  You have thoughts of hurting yourself or others. If you ever feel like you may hurt yourself or others, or have thoughts about taking your own life, get help right away. You can go to your nearest emergency department or call:  Your local emergency services (911 in the [place]  A suicide crisis helpline, such as the  National Suicide Prevention Lifeline at 1-800-273-8255. This is open 24-hours a day. Summary  If you are living with depression, there are ways to help you recover from it and also ways to prevent it from coming back.  Work with your health care team to create a management plan that includes counseling, stress management techniques, and healthy lifestyle habits. This information is not intended to replace advice given to you by your health care provider. Make sure you discuss any questions you have with your health care provider. Document Released: 01/02/2016 Document Revised: 05/23/2018 Document Reviewed: 01/02/2016 Elsevier Patient Education  2020 [employer]  

## 2018-10-10 NOTE — Assessment & Plan Note (Signed)
Chronic, ongoing with improvement with increase in Buspar dosing.  Denies SI/HI.  Will continue current medication regimen at this time and adjust as needed.  Referral to therapy placed.  May consider switch from Cymbalta to Sertraline in future if exacerbation.  Return in 3 months or sooner if worsening.

## 2018-11-28 ENCOUNTER — Ambulatory Visit (INDEPENDENT_AMBULATORY_CARE_PROVIDER_SITE_OTHER): Payer: Medicare Other | Admitting: Licensed Clinical Social Worker

## 2018-11-28 DIAGNOSIS — F332 Major depressive disorder, recurrent severe without psychotic features: Secondary | ICD-10-CM | POA: Diagnosis not present

## 2018-11-28 NOTE — Chronic Care Management (AMB) (Signed)
Chronic Care Management    Clinical Social Work Follow Up Note  11/28/2018 Name: Marie Perry MRN: [id number] DOB: [birth year]  Marie Perry is a 68 y.o. year old female who is a primary care patient of Cannady, Barbaraann Faster, NP. The CCM team was consulted for assistance with Caregiver Stress.   Review of patient status, including review of consultants reports, other relevant assessments, and collaboration with appropriate care team members and the patient's provider was performed as part of comprehensive patient evaluation and provision of chronic care management services.    SDOH (Social Determinants of Health) screening performed today: Stress. See Care Plan for related entries.   Advanced Directives Status: <no information> See Care Plan for related entries.   Outpatient Encounter Medications as of 11/28/2018  Medication Sig  . busPIRone (BUSPAR) 10 MG tablet Take 1 tablet (10 mg total) by mouth 2 (two) times daily.  . Cholecalciferol (VITAMIN D PO) Take 2,000 Units by mouth daily.   . Cyanocobalamin (B-12 PO) Take 1,000 mcg by mouth daily.   . DULoxetine (CYMBALTA) 60 MG capsule Take 1 capsule (60 mg total) by mouth daily.   No facility-administered encounter medications on file as of 11/28/2018.      Goals Addressed    . "I wouldn't mind having some extra help managing my depression and stress" (pt-stated)       Current Barriers:  [name] Knowledge Deficits related to long term strategies for management of depression and caregiver stress . Lacks caregiver support. 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   Case Manager Clinical Goal(s):  [name] Over the next 90 days, patient will verbalize basic understanding of depression/stress process and self health management plan as evidenced by her participation in development of long term plan of care and institution of self health management strategies . Over the next 90 days, patient will be educated on healthy coping  skills and will implement these appropriate self care methods into her daily routine to combat depression/anxiety. . Over the next 90 days, patient will be educated on appropriate mental health resources that are available to her as well as socialization opportunities.   Interventions:  . Evaluation of current treatment plan related to depression/caregiver stress and patient's adherence to plan as established by provider. . Patient interviewed and appropriate assessments performed . Patient reports that her family member has been diagnosed with COVID. She shares that this has added increased stress. Patient wishes to gain a flu shot but does not want to get COVID while doing so. LCSW provided options for her to get flu shot in [facility].  . Provided mental health counseling with regard to depression/caregiver stress. Patient reports that her recent medication dosage increase is helping with her anxiety but not with her depression. Patient shares that she is not experiencing as much crying spells as before which is a great relief to her as they were mentally draining for her to experience daily.  . Education provided on how to implement appropriate self-care coping tools to combat triggers and stress. Patient receptive to education and reports implementing appropriate self-care by reading and spending time outside.  . Discussed plans with patient for ongoing care management follow up and provided patient with direct contact information for care management team . Advised patient to contact LCSW for any ugrent social work needs . Assisted patient/caregiver with obtaining information about health plan benefits . Provided mental health counseling with regard to patient was emotional throughout phone call due  to her daily difficulty with depression/anxiety and stress from being a caregiver. LCSW provided reflective listening and implemented appropriate interventions to help suppport patient and her  emotional needs   Patient Self Care Activities:  . Performs ADL's independently . Calls provider office for new concerns or questions . Attends all scheduled provider appointments  Plan:  . Patient will call provider practice for new needs/concerns. [name] LCSW willfollow up with patient next month to provide ongoing mental health resource education and support  Please see past updates related to this goal by clicking on the "Past Updates" button in the selected goal         Follow Up Plan: SW will follow up with patient by phone over the next quarter  Eula Fried, [place], MSW, [email or web address] Phone: [phone]

## 2018-12-05 DIAGNOSIS — Z23 Encounter for immunization: Secondary | ICD-10-CM | POA: Diagnosis not present

## 2019-01-13 ENCOUNTER — Other Ambulatory Visit: Payer: Self-pay

## 2019-01-13 ENCOUNTER — Ambulatory Visit (INDEPENDENT_AMBULATORY_CARE_PROVIDER_SITE_OTHER): Payer: Medicare Other | Admitting: Nurse Practitioner

## 2019-01-13 ENCOUNTER — Encounter: Payer: Self-pay | Admitting: Nurse Practitioner

## 2019-01-13 DIAGNOSIS — F332 Major depressive disorder, recurrent severe without psychotic features: Secondary | ICD-10-CM

## 2019-01-13 MED ORDER — DULOXETINE HCL 60 MG PO CPEP: 60.0000 mg | ORAL_CAPSULE | Freq: Every day | ORAL | 3 refills | Status: DC

## 2019-01-13 MED ORDER — BUSPIRONE HCL 10 MG PO TABS: 10.0000 mg | ORAL_TABLET | Freq: Two times a day (BID) | ORAL | 3 refills | Status: DC

## 2019-01-13 NOTE — Patient Instructions (Signed)
Living With Depression Everyone experiences occasional disappointment, sadness, and loss in their lives. When you are feeling down, blue, or sad for at least 2 weeks in a row, it may mean that you have depression. Depression can affect your thoughts and feelings, relationships, daily activities, and physical health. It is caused by changes in the way your brain functions. If you receive a diagnosis of depression, your health care provider will tell you which type of depression you have and what treatment options are available to you. If you are living with depression, there are ways to help you recover from it and also ways to prevent it from coming back. How to cope with lifestyle changes Coping with stress     Stress is your body's reaction to life changes and events, both good and bad. Stressful situations may include:  Getting married.  The death of a spouse.  Losing a job.  Retiring.  Having a baby. Stress can last just a few hours or it can be ongoing. Stress can play a major role in depression, so it is important to learn both how to cope with stress and how to think about it differently. Talk with your health care provider or a counselor if you would like to learn more about stress reduction. He or she may suggest some stress reduction techniques, such as:  Music therapy. This can include creating music or listening to music. Choose music that you enjoy and that inspires you.  Mindfulness-based meditation. This kind of meditation can be done while sitting or walking. It involves being aware of your normal breaths, rather than trying to control your breathing.  Centering prayer. This is a kind of meditation that involves focusing on a spiritual word or phrase. Choose a word, phrase, or sacred image that is meaningful to you and that brings you peace.  Deep breathing. To do this, expand your stomach and inhale slowly through your nose. Hold your breath for 3-5 seconds, then exhale  slowly, allowing your stomach muscles to relax.  Muscle relaxation. This involves intentionally tensing muscles then relaxing them. Choose a stress reduction technique that fits your lifestyle and personality. Stress reduction techniques take time and practice to develop. Set aside 5-15 minutes a day to do them. Therapists can offer training in these techniques. The training may be covered by some insurance plans. Other things you can do to manage stress include:  Keeping a stress diary. This can help you learn what triggers your stress and ways to control your response.  Understanding what your limits are and saying no to requests or events that lead to a schedule that is too full.  Thinking about how you respond to certain situations. You may not be able to control everything, but you can control how you react.  Adding humor to your life by watching funny films or TV shows.  Making time for activities that help you relax and not feeling guilty about spending your time this way.  Medicines Your health care provider may suggest certain medicines if he or she feels that they will help improve your condition. Avoid using alcohol and other substances that may prevent your medicines from working properly (may interact). It is also important to:  Talk with your pharmacist or health care provider about all the medicines that you take, their possible side effects, and what medicines are safe to take together.  Make it your goal to take part in all treatment decisions (shared decision-making). This includes giving input on   the side effects of medicines. It is best if shared decision-making with your health care provider is part of your total treatment plan. If your health care provider prescribes a medicine, you may not notice the full benefits of it for 4-8 weeks. Most people who are treated for depression need to be on medicine for at least 6-12 months after they feel better. If you are taking  medicines as part of your treatment, do not stop taking medicines without first talking to your health care provider. You may need to have the medicine slowly decreased (tapered) over time to decrease the risk of harmful side effects. Relationships Your health care provider may suggest family therapy along with individual therapy and drug therapy. While there may not be family problems that are causing you to feel depressed, it is still important to make sure your family learns as much as they can about your mental health. Having your family's support can help make your treatment successful. How to recognize changes in your condition Everyone has a different response to treatment for depression. Recovery from major depression happens when you have not had signs of major depression for two months. This may mean that you will start to:  Have more interest in doing activities.  Feel less hopeless than you did 2 months ago.  Have more energy.  Overeat less often, or have better or improving appetite.  Have better concentration. Your health care provider will work with you to decide the next steps in your recovery. It is also important to recognize when your condition is getting worse. Watch for these signs:  Having fatigue or low energy.  Eating too much or too little.  Sleeping too much or too little.  Feeling restless, agitated, or hopeless.  Having trouble concentrating or making decisions.  Having unexplained physical complaints.  Feeling irritable, angry, or aggressive. Get help as soon as you or your family members notice these symptoms coming back. How to get support and help from others How to talk with friends and family members about your condition  Talking to friends and family members about your condition can provide you with one way to get support and guidance. Reach out to trusted friends or family members, explain your symptoms to them, and let them know that you are  working with a health care provider to treat your depression. Financial resources Not all insurance plans cover mental health care, so it is important to check with your insurance carrier. If paying for co-pays or counseling services is a problem, search for a local or county mental health care center. They may be able to offer public mental health care services at low or no cost when you are not able to see a private health care provider. If you are taking medicine for depression, you may be able to get the generic form, which may be less expensive. Some makers of prescription medicines also offer help to patients who cannot afford the medicines they need. Follow these instructions at home:   Get the right amount and quality of sleep.  Cut down on using caffeine, tobacco, alcohol, and other potentially harmful substances.  Try to exercise, such as walking or lifting small weights.  Take over-the-counter and prescription medicines only as told by your health care provider.  Eat a healthy diet that includes plenty of vegetables, fruits, whole grains, low-fat dairy products, and lean protein. Do not eat a lot of foods that are high in solid fats, added sugars, or salt.    Keep all follow-up visits as told by your health care provider. This is important. Contact a health care provider if:  You stop taking your antidepressant medicines, and you have any of these symptoms: ? Nausea. ? Headache. ? Feeling lightheaded. ? Chills and body aches. ? Not being able to sleep (insomnia).  You or your friends and family think your depression is getting worse. Get help right away if:  You have thoughts of hurting yourself or others. If you ever feel like you may hurt yourself or others, or have thoughts about taking your own life, get help right away. You can go to your nearest emergency department or call:  Your local emergency services (911 in the [place]  A suicide crisis helpline, such as the  National Suicide Prevention Lifeline at 1-800-273-8255. This is open 24-hours a day. Summary  If you are living with depression, there are ways to help you recover from it and also ways to prevent it from coming back.  Work with your health care team to create a management plan that includes counseling, stress management techniques, and healthy lifestyle habits. This information is not intended to replace advice given to you by your health care provider. Make sure you discuss any questions you have with your health care provider. Document Released: 01/02/2016 Document Revised: 05/23/2018 Document Reviewed: 01/02/2016 Elsevier Patient Education  2020 [employer]  

## 2019-01-13 NOTE — Progress Notes (Signed)
LMP  (LMP Unknown)    Subjective:    Patient ID: Marie Perry, female    DOB: 1950/10/08, 68 y.o.   MRN: [id number]  HPI: Marie Perry is a 68 y.o. female  Chief Complaint  Patient presents with  . Depression    This visit was completed via Doximity due to the restrictions of the COVID-19 pandemic. All issues as above were discussed and addressed. Physical exam was done as above through visual confirmation on Doximity. If it was felt that the patient should be evaluated in the office, they were directed there. The patient verbally consented to this visit.  Location of the patient: home  Location of the provider: home  Those involved with this call:  ? Provider: [name], DNP ? CMA: Yvonna Alanis, CMA ? Front Desk/Registration: Jill Side   Time spent on call: 15 minutes with patient face to face via video conference. More than 50% of this time was spent in counseling and coordination of care. 10 minutes total spent in review of patient's record and preparation of their chart.   I verified patient identity using two factors (patient name and date of birth). Patient consents verbally to being seen via telemedicine visit today.   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 Has ongoing issues with anxiety/depression due to caregiver strain. Current medications include Cymbalta 60 MG and Buspar 10 MG BID (increased to this in May). Endorses this has been a difficult time due to not being able to get out on occasion due to concern for exposing her mother. Reports she has minimal socialization recently. States that increase in Buspar has helped, less tearful.  Referral placed for therapy last visit, but reports she received a call from female therapist and would prefer female, which was noted in referral. Mood status: stable Satisfied with current treatment?: yes Symptom severity: moderate  Duration of current treatment : chronic Side  effects: no Medication compliance: good compliance Psychotherapy/counseling: currently with the SW Previous psychiatric medications: Cymbalta and Buspar Depressed mood: yes Anxious mood: no Anhedonia: no Significant weight loss or gain: no Insomnia: at times has issues falling asleep Fatigue: no Feelings of worthlessness or guilt: no Impaired concentration/indecisiveness: yes Suicidal ideations: no Hopelessness: no Crying spells: at times, but reports these have improved with increase in Buspar Depression screen [facility] 2/9 01/13/2019 10/10/2018 06/30/2018 05/01/2018 04/07/2018  Decreased Interest 1 1 1 2 2   Down, Depressed, Hopeless 0 1 1 2 2   PHQ - 2 Score 1 2 2 4 4   Altered sleeping 1 2 3 1 1   Tired, decreased energy 3 3 3 2 2   Change in appetite 2 1 3 1 1   Feeling bad or failure about yourself  1 1 1 1 1   Trouble concentrating 1 1 1  0 1  Moving slowly or fidgety/restless 0 0 1 0 0  Suicidal thoughts 0 0 0 0 0  PHQ-9 Score 9 10 14 9 10   Difficult doing work/chores Somewhat difficult Somewhat difficult Very difficult Not difficult at all Not difficult at all  Some recent data might be hidden    Relevant past medical, surgical, family and social history reviewed and updated as indicated. Interim medical history since our last visit reviewed. Allergies and medications reviewed and updated.  Review of Systems  Constitutional: Negative for activity change, appetite change, diaphoresis, fatigue and fever.  Respiratory: Negative for cough, chest tightness and shortness of breath.   Cardiovascular: Negative for chest pain, palpitations and  leg swelling.  Gastrointestinal: Negative for abdominal distention, abdominal pain, constipation, diarrhea, nausea and vomiting.  Neurological: Negative for dizziness, syncope, weakness, light-headedness, numbness and headaches.  Psychiatric/Behavioral: Negative.     Per HPI unless specifically indicated above     Objective:    LMP  (LMP Unknown)    Wt Readings from Last 3 Encounters:  04/07/18 195 lb (88.5 kg)  02/28/18 188 lb (85.3 kg)  02/27/18 199 lb 12.8 oz (90.6 kg)    Physical Exam Vitals signs and nursing note reviewed.  Constitutional:      General: She is awake. She is not in acute distress.    Appearance: She is well-developed. She is not ill-appearing.  HENT:     Head: Normocephalic.     Right Ear: Hearing normal.     Left Ear: Hearing normal.  Eyes:     General: Lids are normal.        Right eye: No discharge.        Left eye: No discharge.     Conjunctiva/sclera: Conjunctivae normal.  Neck:     Musculoskeletal: Normal range of motion.  Pulmonary:     Effort: Pulmonary effort is normal. No accessory muscle usage or respiratory distress.  Neurological:     Mental Status: She is alert and oriented to person, place, and time.  Psychiatric:        Attention and Perception: Attention normal.        Mood and Affect: Mood normal.        Behavior: Behavior normal. Behavior is cooperative.        Thought Content: Thought content normal.        Judgment: Judgment normal.     Results for orders placed or performed in visit on 02/28/18  CBC with Differential/Platelet  Result Value Ref Range   WBC 9.3 3.4 - 10.8 x10E3/uL   RBC 4.46 3.77 - 5.28 x10E6/uL   Hemoglobin 13.4 11.1 - 15.9 g/dL   Hematocrit 39.9 34.0 - 46.6 %   MCV 90 79 - 97 fL   MCH 30.0 26.6 - 33.0 pg   MCHC 33.6 31.5 - 35.7 g/dL   RDW 13.2 11.7 - 15.4 %   Platelets 223 150 - 450 x10E3/uL   Neutrophils 65 Not Estab. %   Lymphs 26 Not Estab. %   Monocytes 7 Not Estab. %   Eos 2 Not Estab. %   Basos 0 Not Estab. %   Neutrophils Absolute 6.0 1.4 - 7.0 x10E3/uL   Lymphocytes Absolute 2.4 0.7 - 3.1 x10E3/uL   Monocytes Absolute 0.6 0.1 - 0.9 x10E3/uL   EOS (ABSOLUTE) 0.2 0.0 - 0.4 x10E3/uL   Basophils Absolute 0.0 0.0 - 0.2 x10E3/uL   Immature Granulocytes 0 Not Estab. %   Immature Grans (Abs) 0.0 0.0 - 0.1 x10E3/uL  Comprehensive metabolic  panel  Result Value Ref Range   Glucose 84 65 - 99 mg/dL   BUN 18 8 - 27 mg/dL   Creatinine, Ser 1.10 (H) 0.57 - 1.00 mg/dL   GFR calc non Af Amer 52 (L) >59 mL/min/1.73   GFR calc Af Amer 60 >59 mL/min/1.73   BUN/Creatinine Ratio 16 12 - 28   Sodium 140 134 - 144 mmol/L   Potassium 4.4 3.5 - 5.2 mmol/L   Chloride 104 96 - 106 mmol/L   CO2 23 20 - 29 mmol/L   Calcium 9.8 8.7 - 10.3 mg/dL   Total Protein 6.6 6.0 - 8.5 g/dL   Albumin 4.2  3.6 - 4.8 g/dL   Globulin, Total 2.4 1.5 - 4.5 g/dL   Albumin/Globulin Ratio 1.8 1.2 - 2.2   Bilirubin Total 0.4 0.0 - 1.2 mg/dL   Alkaline Phosphatase 76 39 - 117 IU/L   AST 16 0 - 40 IU/L   ALT 20 0 - 32 IU/L  Lipid Panel w/o Chol/HDL Ratio  Result Value Ref Range   Cholesterol, Total 210 (H) 100 - 199 mg/dL   Triglycerides 320 (H) 0 - 149 mg/dL   HDL 41 >39 mg/dL   VLDL Cholesterol Cal 64 (H) 5 - 40 mg/dL   LDL Calculated 105 (H) 0 - 99 mg/dL  TSH  Result Value Ref Range   TSH 1.700 0.450 - 4.500 uIU/mL  VITAMIN D 25 Hydroxy (Vit-D Deficiency, Fractures)  Result Value Ref Range   Vit D, 25-Hydroxy 28.5 (L) 30.0 - 100.0 ng/mL  Vitamin B12  Result Value Ref Range   Vitamin B-12 1,587 (H) 232 - 1,245 pg/mL      Assessment & Plan:   Problem List Items Addressed This Visit      Other   Recurrent major depression-severe (HCC)    Chronic, ongoing with improvement.  Denies SI/HI.  Will continue current medication regimen at this time and adjust as needed.  Referral to therapy placed last visit and will check on this.  May consider switch from Cymbalta to Sertraline in future if exacerbation.  Return in 3 months or sooner if worsening.      Relevant Medications   busPIRone (BUSPAR) 10 MG tablet   DULoxetine (CYMBALTA) 60 MG capsule       Follow up plan: Return in about 3 months (around 04/13/2019) for Annual physical.

## 2019-01-13 NOTE — Assessment & Plan Note (Signed)
Chronic, ongoing with improvement.  Denies SI/HI.  Will continue current medication regimen at this time and adjust as needed.  Referral to therapy placed last visit and will check on this.  May consider switch from Cymbalta to Sertraline in future if exacerbation.  Return in 3 months or sooner if worsening.

## 2019-02-02 ENCOUNTER — Telehealth: Payer: Self-pay

## 2019-02-02 ENCOUNTER — Ambulatory Visit: Payer: Self-pay | Admitting: Licensed Clinical Social Worker

## 2019-02-02 NOTE — Chronic Care Management (AMB) (Signed)
  Care Management   Follow Up Note   02/02/2019 Name: Marie Perry MRN: [id number] DOB: Sep 16, 1950  Referred by: Venita Lick, NP Reason for referral : Care Coordination   Marie Perry is a 68 y.o. year old female who is a primary care patient of Cannady, Barbaraann Faster, NP. The care management team was consulted for assistance with care management and care coordination needs.    Review of patient status, including review of consultants reports, relevant laboratory and other test results, and collaboration with appropriate care team members and the patient's provider was performed as part of comprehensive patient evaluation and provision of chronic care management services.    LCSW completed CCM outreach attempt today but was unable to reach patient successfully. A HIPPA compliant voice message was left encouraging patient to return call once available. LCSW rescheduled CCM SW appointment as well.   A HIPPA compliant phone message was left for the patient providing contact information and requesting a return call.   Eula Fried, BSW, MSW, [facility] Care Management [email or web address] Phone: [phone]

## 2019-03-05 ENCOUNTER — Ambulatory Visit (INDEPENDENT_AMBULATORY_CARE_PROVIDER_SITE_OTHER): Payer: Medicare Other

## 2019-03-05 ENCOUNTER — Ambulatory Visit: Payer: Medicare Other | Admitting: Nurse Practitioner

## 2019-03-05 DIAGNOSIS — Z0000 Encounter for general adult medical examination without abnormal findings: Secondary | ICD-10-CM | POA: Diagnosis not present

## 2019-03-05 NOTE — Patient Instructions (Signed)
Marie Perry , Thank you for taking time to come for your Medicare Wellness Visit. I appreciate your ongoing commitment to your health goals. Please review the following plan we discussed and let me know if I can assist you in the future.   Screening recommendations/referrals: Colonoscopy: completed 04/15/2017 Mammogram: completed 04/11/2017, postponed for now  Bone Density: completed 04/11/2017 Recommended yearly ophthalmology/optometry visit for glaucoma screening and checkup Recommended yearly dental visit for hygiene and checkup  Vaccinations: Influenza vaccine: up to date  Pneumococcal vaccine: up to date  Tdap vaccine: up to date  Shingles vaccine: shingrix eligible   We are recommending the vaccine to everyone who has not had an allergic reaction to any of the components of the vaccine. If you have specific questions about the vaccine, please bring them up with your health care provider to discuss them.   We will likely not be getting the vaccine in the office for the first rounds of vaccinations. The way they are releasing the vaccines is going to be through the health systems (like [facility], [facility], [facility], [place]) or through your county health department.   The [facility] Department is giving vaccines to those 75+ starting 02/18/19  M-F 7AM to 4PM [employer] [place] [address], [place], [place] in a drive through tent  If you are 65+ you can get a vaccine through [facility] by signing up for an appointment.  You can sign up by going to: [email or web address].  You can get more information by going to: [email or web address]    Advanced directives: copy on file   Conditions/risks identified: please let us know if you need anything.   Next appointment: Follow up in    Preventive Care 65 Years and Older, Female Preventive care refers to lifestyle choices and visits with your health care provider that can promote health and  wellness. What does preventive care include?  A yearly physical exam. This is also called an annual well check.  Dental exams once or twice a year.  Routine eye exams. Ask your health care provider how often you should have your eyes checked.  Personal lifestyle choices, including:  Daily care of your teeth and gums.  Regular physical activity.  Eating a healthy diet.  Avoiding tobacco and drug use.  Limiting alcohol use.  Practicing safe sex.  Taking low-dose aspirin every day.  Taking vitamin and mineral supplements as recommended by your health care provider. What happens during an annual well check? The services and screenings done by your health care provider during your annual well check will depend on your age, overall health, lifestyle risk factors, and family history of disease. Counseling  Your health care provider may ask you questions about your:  Alcohol use.  Tobacco use.  Drug use.  Emotional well-being.  Home and relationship well-being.  Sexual activity.  Eating habits.  History of falls.  Memory and ability to understand (cognition).  Work and work Statistician.  Reproductive health. Screening  You may have the following tests or measurements:  Height, weight, and BMI.  Blood pressure.  Lipid and cholesterol levels. These may be checked every 5 years, or more frequently if you are over 52 years old.  Skin check.  Lung cancer screening. You may have this screening every year starting at age 56 if you have a 30-pack-year history of smoking and currently smoke or have quit within the past 15 years.  Fecal occult blood test (FOBT) of the stool. You may  have this test every year starting at age 70.  Flexible sigmoidoscopy or colonoscopy. You may have a sigmoidoscopy every 5 years or a colonoscopy every 10 years starting at age 3.  Hepatitis C blood test.  Hepatitis B blood test.  Sexually transmitted disease (STD)  testing.  Diabetes screening. This is done by checking your blood sugar (glucose) after you have not eaten for a while (fasting). You may have this done every 1-3 years.  Bone density scan. This is done to screen for osteoporosis. You may have this done starting at age 52.  Mammogram. This may be done every 1-2 years. Talk to your health care provider about how often you should have regular mammograms. Talk with your health care provider about your test results, treatment options, and if necessary, the need for more tests. Vaccines  Your health care provider may recommend certain vaccines, such as:  Influenza vaccine. This is recommended every year.  Tetanus, diphtheria, and acellular pertussis (Tdap, Td) vaccine. You may need a Td booster every 10 years.  Zoster vaccine. You may need this after age 36.  Pneumococcal 13-valent conjugate (PCV13) vaccine. One dose is recommended after age 60.  Pneumococcal polysaccharide (PPSV23) vaccine. One dose is recommended after age 73. Talk to your health care provider about which screenings and vaccines you need and how often you need them. This information is not intended to replace advice given to you by your health care provider. Make sure you discuss any questions you have with your health care provider. Document Released: 02/25/2015 Document Revised: 10/19/2015 Document Reviewed: 11/30/2014 Elsevier Interactive Patient Education  2017 [place] Prevention in the Home Falls can cause injuries. They can happen to people of all ages. There are many things you can do to make your home safe and to help prevent falls. What can I do on the outside of my home?  Regularly fix the edges of walkways and driveways and fix any cracks.  Remove anything that might make you trip as you walk through a door, such as a raised step or threshold.  Trim any bushes or trees on the path to your home.  Use bright outdoor lighting.  Clear any walking  paths of anything that might make someone trip, such as rocks or tools.  Regularly check to see if handrails are loose or broken. Make sure that both sides of any steps have handrails.  Any raised decks and porches should have guardrails on the edges.  Have any leaves, snow, or ice cleared regularly.  Use sand or salt on walking paths during winter.  Clean up any spills in your garage right away. This includes oil or grease spills. What can I do in the bathroom?  Use night lights.  Install grab bars by the toilet and in the tub and shower. Do not use towel bars as grab bars.  Use non-skid mats or decals in the tub or shower.  If you need to sit down in the shower, use a plastic, non-slip stool.  Keep the floor dry. Clean up any water that spills on the floor as soon as it happens.  Remove soap buildup in the tub or shower regularly.  Attach bath mats securely with double-sided non-slip rug tape.  Do not have throw rugs and other things on the floor that can make you trip. What can I do in the bedroom?  Use night lights.  Make sure that you have a light by your bed that is easy  to reach.  Do not use any sheets or blankets that are too big for your bed. They should not hang down onto the floor.  Have a firm chair that has side arms. You can use this for support while you get dressed.  Do not have throw rugs and other things on the floor that can make you trip. What can I do in the kitchen?  Clean up any spills right away.  Avoid walking on wet floors.  Keep items that you use a lot in easy-to-reach places.  If you need to reach something above you, use a strong step stool that has a grab bar.  Keep electrical cords out of the way.  Do not use floor polish or wax that makes floors slippery. If you must use wax, use non-skid floor wax.  Do not have throw rugs and other things on the floor that can make you trip. What can I do with my stairs?  Do not leave any items  on the stairs.  Make sure that there are handrails on both sides of the stairs and use them. Fix handrails that are broken or loose. Make sure that handrails are as long as the stairways.  Check any carpeting to make sure that it is firmly attached to the stairs. Fix any carpet that is loose or worn.  Avoid having throw rugs at the top or bottom of the stairs. If you do have throw rugs, attach them to the floor with carpet tape.  Make sure that you have a light switch at the top of the stairs and the bottom of the stairs. If you do not have them, ask someone to add them for you. What else can I do to help prevent falls?  Wear shoes that:  Do not have high heels.  Have rubber bottoms.  Are comfortable and fit you well.  Are closed at the toe. Do not wear sandals.  If you use a stepladder:  Make sure that it is fully opened. Do not climb a closed stepladder.  Make sure that both sides of the stepladder are locked into place.  Ask someone to hold it for you, if possible.  Clearly mark and make sure that you can see:  Any grab bars or handrails.  First and last steps.  Where the edge of each step is.  Use tools that help you move around (mobility aids) if they are needed. These include:  Canes.  Walkers.  Scooters.  Crutches.  Turn on the lights when you go into a dark area. Replace any light bulbs as soon as they burn out.  Set up your furniture so you have a clear path. Avoid moving your furniture around.  If any of your floors are uneven, fix them.  If there are any pets around you, be aware of where they are.  Review your medicines with your doctor. Some medicines can make you feel dizzy. This can increase your chance of falling. Ask your doctor what other things that you can do to help prevent falls. This information is not intended to replace advice given to you by your health care provider. Make sure you discuss any questions you have with your health care  provider. Document Released: 11/25/2008 Document Revised: 07/07/2015 Document Reviewed: 03/05/2014 Elsevier Interactive Patient Education  2017 [employer].

## 2019-03-05 NOTE — Progress Notes (Signed)
Subjective:   Marie Perry is a 69 y.o. female who presents for Medicare Annual (Subsequent) preventive examination.  This visit is being conducted via phone call  - after an attmept to do on video chat - due to the COVID-19 pandemic. This patient has given me verbal consent via phone to conduct this visit, patient states they are participating from their home address. Some vital signs may be absent or patient reported.   Patient identification: identified by name, DOB, and current address.    Review of Systems:   Cardiac Risk Factors include: advanced age (>55men, >18 women)     Objective:     Vitals: LMP  (LMP Unknown)   There is no height or weight on file to calculate BMI.  Advanced Directives 02/27/2018 04/15/2017 02/22/2017 09/17/2016  Does Patient Have a Medical Advance Directive? Yes No No No  Type of Paramedic of [place];Living will - - -  Copy of [place] in Chart? Yes - validated most recent copy scanned in chart (See row information) - - -  Would patient like information on creating a medical advance directive? - - Yes (MAU/Ambulatory/Procedural Areas - Information given) -    Tobacco Social History   Tobacco Use  Smoking Status Never Smoker  Smokeless Tobacco Never Used     Counseling given: Not Answered   Clinical Intake:  Pre-visit preparation completed: Yes  Pain : 0-10 Pain Score: 0-No pain Pain Type: Chronic pain Pain Location: Back Pain Descriptors / Indicators: Aching Pain Onset: More than a month ago Pain Frequency: Constant     Nutritional Status: BMI > 30  Obese Nutritional Risks: None Diabetes: No  How often do you need to have someone help you when you read instructions, pamphlets, or other written materials from your doctor or pharmacy?: 1 - Never  Interpreter Needed?: No  Information entered by :: Erikah Thumm,LPN  Past Medical History:  Diagnosis Date  . Allergy   . Anxiety     . Arthritis   . Cataract 02/2016   Yearly eye exam w/Dr. Ellin Mayhew  . 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 Depression   . GERD (gastroesophageal reflux disease)   . Hypertriglyceridemia   . IBS (irritable bowel syndrome)   . IFG (impaired fasting glucose)   . Osteopenia   . Sleep apnea   . Vitamin D deficiency    Past Surgical History:  Procedure Laterality Date  . bright's surgery    . COLONOSCOPY N/A 09/17/2016   Procedure: COLONOSCOPY;  Surgeon: Lollie Sails, MD;  Location: [facility] ENDOSCOPY;  Service: Endoscopy;  Laterality: N/A;  . COLONOSCOPY WITH PROPOFOL N/A 04/15/2017   Procedure: COLONOSCOPY WITH PROPOFOL;  Surgeon: Lollie Sails, MD;  [facility] ENDOSCOPY;  Service: Endoscopy;  Laterality: N/A;  . SKIN TAG REMOVAL     rectal   Family History  Problem Relation Age of Onset  . Mental illness Mother   . Depression Mother   . Anxiety disorder Mother   . Hearing loss Mother   . Miscarriages / [place] Mother   . Vision loss Mother   . Stroke Father   . Heart disease Father   . Hearing loss Father   . Vision loss Father   . Cancer Maternal Grandmother        ovarian  . Anemia Maternal Grandmother   . Arthritis Maternal Grandmother   . Lung disease Maternal Grandfather   .  Birth defects Maternal Grandfather   . Asthma Maternal Grandfather   . Depression Sister   . Fibromyalgia Brother   . GER disease Brother   . Heart disease Paternal Grandmother   . Birth defects Paternal Grandfather   . Depression Brother   . Asthma Paternal Uncle   . Birth defects Sister   . Early death Sister   . Cancer Maternal Uncle   . Hearing loss Maternal Uncle   . Kidney disease Maternal Uncle   . Depression Brother   . Early death Paternal Uncle   . Hearing loss Maternal Aunt   . Vision loss Maternal Aunt   . Varicose Veins Maternal Aunt   . Heart disease Paternal Uncle   . Stroke Paternal Uncle   . Breast cancer Neg Hx    Social History    Socioeconomic History  . Marital status: Single    Spouse name: Not on file  . Number of children: 0  . Years of education: Not on file  . Highest education level: Associate degree: academic program  Occupational History  . Occupation: retired    Comment: works part time  Tobacco Use  . Smoking status: Never Smoker  . Smokeless tobacco: Never Used  Substance and Sexual Activity  . Alcohol use: Yes    Alcohol/week: 0.0 standard drinks    Comment: rarely indulge  . Drug use: Never  . Sexual activity: Never  Other Topics Concern  . Not on file  Social History Narrative  . Not on file   Social Determinants of Health   Financial Resource Strain:   . Difficulty of Paying Living Expenses: Not on file  Food Insecurity:   . Worried About Charity fundraiser in the Last Year: Not on file  . Ran Out of Food in the Last Year: Not on file  Transportation Needs:   . Lack of Transportation (Medical): Not on file  . Lack of Transportation (Non-Medical): Not on file  Physical Activity:   . Days of Exercise per Week: Not on file  . Minutes of Exercise per Session: Not on file  Stress:   . Feeling of Stress : Not on file  Social Connections:   . Frequency of Communication with Friends and Family: Not on file  . Frequency of Social Gatherings with Friends and Family: Not on file  . Attends Religious Services: Not on file  . Active Member of Clubs or Organizations: Not on file  . Attends Archivist Meetings: Not on file  . Marital Status: Not on file    Outpatient Encounter Medications as of 03/05/2019  Medication Sig  . busPIRone (BUSPAR) 10 MG tablet Take 1 tablet (10 mg total) by mouth 2 (two) times daily.  . Cholecalciferol (VITAMIN D PO) Take 2,000 Units by mouth daily.   . Cyanocobalamin (B-12 PO) Take 1,000 mcg by mouth daily.   . DULoxetine (CYMBALTA) 60 MG capsule Take 1 capsule (60 mg total) by mouth daily.   No facility-administered encounter medications on  file as of 03/05/2019.    Activities of Daily Living In your present state of health, do you have any difficulty performing the following activities: 03/05/2019  Hearing? N  Comment no hearing aids  Vision? Y  Comment goes to Dr.Woodard. reading glasses  Difficulty concentrating or making decisions? Y  Comment comes back  Walking or climbing stairs? N  Dressing or bathing? N  Doing errands, shopping? N  Preparing Food and eating ? N  Using  the Toilet? N  In the past six months, have you accidently leaked urine? N  Do you have problems with loss of bowel control? N  Managing your Medications? N  Managing your Finances? N  Housekeeping or managing your Housekeeping? N  Comment takes time  Some recent data might be hidden    Patient Care Team: Venita Lick, NP as PCP - General (Nurse Practitioner) Clerance Lav, RN as Case Manager Greg Cutter, LCSW as Social Worker (Licensed Clinical Social Worker)    Assessment:   This is a routine wellness examination for [place].  Exercise Activities and Dietary recommendations Current Exercise Habits: The patient does not participate in regular exercise at present, Intensity: Mild, Exercise limited by: None identified  Goals    . "I wouldn't mind having some extra help managing my depression and stress" (pt-stated)     Current Barriers:  [name] Knowledge Deficits related to long term strategies for management of depression and caregiver stress . Lacks caregiver support. 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   Case Manager Clinical Goal(s):  [name] Over the next 90 days, patient will verbalize basic understanding of depression/stress process and self health management plan as evidenced by her participation in development of long term plan of care and institution of self health management strategies . Over the next 90 days, patient will be educated on healthy coping skills and will implement these appropriate self care methods  into her daily routine to combat depression/anxiety. . Over the next 90 days, patient will be educated on appropriate mental health resources that are available to her as well as socialization opportunities.   Interventions:  . Evaluation of current treatment plan related to depression/caregiver stress and patient's adherence to plan as established by provider. . Patient interviewed and appropriate assessments performed . Patient reports that her family member has been diagnosed with COVID. She shares that this has added increased stress. Patient wishes to gain a flu shot but does not want to get COVID while doing so. LCSW provided options for her to get flu shot in [facility].  . Provided mental health counseling with regard to depression/caregiver stress. Patient reports that her recent medication dosage increase is helping with her anxiety but not with her depression. Patient shares that she is not experiencing as much crying spells as before which is a great relief to her as they were mentally draining for her to experience daily.  . Education provided on how to implement appropriate self-care coping tools to combat triggers and stress. Patient receptive to education and reports implementing appropriate self-care by reading and spending time outside.  . Discussed plans with patient for ongoing care management follow up and provided patient with direct contact information for care management team . Advised patient to contact LCSW for any ugrent social work needs . Assisted patient/caregiver with obtaining information about health plan benefits . Provided mental health counseling with regard to patient was emotional throughout phone call due to her daily difficulty with depression/anxiety and stress from being a caregiver. LCSW provided reflective listening and implemented appropriate interventions to help suppport patient and her emotional needs   Patient Self Care Activities:  . Performs  ADL's independently . Calls provider office for new concerns or questions . Attends all scheduled provider appointments  Plan:  . Patient will call provider practice for new needs/concerns. [name] LCSW willfollow up with patient next month to provide ongoing mental health resource education and support  Please  see past updates related to this goal by clicking on the "Past Updates" button in the selected goal         . DIET - INCREASE WATER INTAKE     Recommend drinking at least 6-8 glasses of water a day    . Weight (lb) < 200 lb (90.7 kg)     Pt would like to lose 10 lbs over the next year        Fall Risk: Fall Risk  03/05/2019 06/30/2018 05/01/2018 02/27/2018 02/22/2017  Falls in the past year? 0 1 0 1 No  Number falls in past yr: 0 0 - 1 -  Comment - - - June 2019 -  Injury with Fall? 0 1 - 1 -  Comment - - - mechanical fall in dark room, broken left arm -  Risk for fall due to : - - - History of fall(s) -  Follow up - - - Falls evaluation completed;Falls prevention discussed -    FALL RISK PREVENTION PERTAINING TO THE HOME:  Any stairs in or around the home? Yes  going into home  If so, are there any without handrails? No   Home free of loose throw rugs in walkways, pet beds, electrical cords, etc? Yes  Adequate lighting in your home to reduce risk of falls? Yes   ASSISTIVE DEVICES UTILIZED TO PREVENT FALLS:  Life alert? no Use of a cane, walker or w/c? No  Grab bars in the bathroom? No  Shower chair or bench in shower? No  Elevated toilet seat or a handicapped toilet? No   DME ORDERS:  DME order needed?  No   TIMED UP AND GO:  Unable to perform    Depression Screen PHQ 2/9 Scores 03/05/2019 01/13/2019 10/10/2018 06/30/2018  PHQ - 2 Score 1 1 2 2   PHQ- 9 Score - 9 10 14      Cognitive Function     6CIT Screen 02/27/2018 02/22/2017  What Year? 0 points 0 points  What month? 0 points 0 points  What time? 0 points 0 points  Count back from 20 0 points 0 points   Months in reverse 0 points 0 points  Repeat phrase 0 points 0 points  Total Score 0 0    Immunization History  Administered Date(s) Administered  . Influenza Inj Mdck Quad Pf 11/28/2017  . Influenza, High Dose Seasonal PF 11/09/2016  . Influenza-Unspecified 11/22/2014, 12/12/2015, 01/07/2017, 11/28/2017, 12/16/2018  . Pneumococcal Conjugate-13 02/22/2017  . Pneumococcal Polysaccharide-23 02/27/2018  . Tdap 10/07/2015  . Zoster 03/06/2013    Qualifies for Shingles Vaccine? Yes  Zostavax completed n/a. Due for Shingrix. Education has been provided regarding the importance of this vaccine. Pt has been advised to call insurance company to determine out of pocket expense. Advised may also receive vaccine at local pharmacy or Health Dept. Verbalized acceptance and understanding.  Tdap: up to date   Flu Vaccine: up to date   Pneumococcal Vaccine: up to date    Screening Tests Health Maintenance  Topic Date Due  . MAMMOGRAM  04/12/2019  . COLONOSCOPY  04/15/2020  . TETANUS/TDAP  10/06/2025  . INFLUENZA VACCINE  Completed  . DEXA SCAN  Completed  . Hepatitis C Screening  Completed  . PNA vac Low Risk Adult  Completed    Cancer Screenings:  Colorectal Screening: Completed 04/15/2017. Repeat every 3 years  Mammogram: Completed 04/11/2017. Repeat every year.   Bone Density: Completed 04/11/2017.   Lung Cancer Screening: (Low Dose CT Chest  recommended if Age 66-80 years, 66 pack-year currently smoking OR have quit w/in 15years.) does not qualify.    Additional Screening:  Hepatitis C Screening: does qualify; Completed 12/23/2017  Vision Screening: Recommended annual ophthalmology exams for early detection of glaucoma and other disorders of the eye. Is the patient up to date with their annual eye exam?  No  Who is the provider or what is the name of the office in which the pt attends annual eye exams? Dr.Woodard   Dental Screening: Recommended annual dental exams for proper  oral hygiene  Community Resource Referral:  CRR required this visit?  No       Plan:  I have personally reviewed and addressed the Medicare Annual Wellness questionnaire and have noted the following in the patient's chart:  A. Medical and social history B. Use of alcohol, tobacco or illicit drugs  C. Current medications and supplements D. Functional ability and status E.  Nutritional status F.  Physical activity G. Advance directives H. List of other physicians I.  Hospitalizations, surgeries, and ER visits in previous 12 months J.  [place] such as hearing and vision if needed, cognitive and depression L. Referrals and appointments   In addition, I have reviewed and discussed with patient certain preventive protocols, quality metrics, and best practice recommendations. A written personalized care plan for preventive services as well as general preventive health recommendations were provided to patient.  Signed,    Bevelyn Ngo, LPN  [phone] Nurse Health Advisor   Nurse Notes: none

## 2019-03-13 ENCOUNTER — Telehealth: Payer: Self-pay

## 2019-05-08 ENCOUNTER — Ambulatory Visit (INDEPENDENT_AMBULATORY_CARE_PROVIDER_SITE_OTHER): Payer: Medicare Other | Admitting: Nurse Practitioner

## 2019-05-08 ENCOUNTER — Other Ambulatory Visit: Payer: Self-pay

## 2019-05-08 ENCOUNTER — Encounter: Payer: Self-pay | Admitting: Nurse Practitioner

## 2019-05-08 VITALS — BP 131/77 | HR 88 | Temp 99.2°F | Ht 65.5 in | Wt 202.6 lb

## 2019-05-08 DIAGNOSIS — Z0000 Encounter for general adult medical examination without abnormal findings: Secondary | ICD-10-CM

## 2019-05-08 DIAGNOSIS — Z1231 Encounter for screening mammogram for malignant neoplasm of breast: Secondary | ICD-10-CM

## 2019-05-08 DIAGNOSIS — E781 Pure hyperglyceridemia: Secondary | ICD-10-CM

## 2019-05-08 DIAGNOSIS — M810 Age-related osteoporosis without current pathological fracture: Secondary | ICD-10-CM | POA: Diagnosis not present

## 2019-05-08 DIAGNOSIS — F332 Major depressive disorder, recurrent severe without psychotic features: Secondary | ICD-10-CM

## 2019-05-08 DIAGNOSIS — K219 Gastro-esophageal reflux disease without esophagitis: Secondary | ICD-10-CM | POA: Diagnosis not present

## 2019-05-08 DIAGNOSIS — E538 Deficiency of other specified B group vitamins: Secondary | ICD-10-CM

## 2019-05-08 DIAGNOSIS — G4733 Obstructive sleep apnea (adult) (pediatric): Secondary | ICD-10-CM

## 2019-05-08 DIAGNOSIS — E559 Vitamin D deficiency, unspecified: Secondary | ICD-10-CM

## 2019-05-08 DIAGNOSIS — E669 Obesity, unspecified: Secondary | ICD-10-CM | POA: Diagnosis not present

## 2019-05-08 MED ORDER — OMEPRAZOLE 20 MG PO CPDR: 20.0000 mg | DELAYED_RELEASE_CAPSULE | Freq: Every day | ORAL | 3 refills | Status: DC

## 2019-05-08 NOTE — Assessment & Plan Note (Signed)
Recommend continued focus on healthy diet choices and regular physical activity (30 minutes 5 days a week).  Focus on small goals and set timelines to achieve.

## 2019-05-08 NOTE — Assessment & Plan Note (Signed)
Chronic, stable.  Denies SI/HI.  Will continue current medication regimen at this time and adjust as needed.  Referral to therapy placed last visit and will place new referral today.  May consider switch from Cymbalta to Sertraline in future if exacerbation.  Return in 3 months or sooner if worsening.

## 2019-05-08 NOTE — Assessment & Plan Note (Signed)
Chronic, ongoing with poor tolerance to bisphosphonate in past.  Could consider Prolia, which was discuss with her and provided her with education on this.  Continue to discuss with her at visits.

## 2019-05-08 NOTE — Assessment & Plan Note (Signed)
Ongoing.  Lipid panel today.  Continues to monitor diet. No medication at this time.  Consider initiation of medication if elevations noted or elevated ASCVD.

## 2019-05-08 NOTE — Assessment & Plan Note (Signed)
Chronic, stable.  Continue daily supplement and recheck B12 level today, adjust as needed. ?

## 2019-05-08 NOTE — Patient Instructions (Addendum)
[facility] at [facility]  Address: [address], [place], [place] [address]  Phone: [phone]   Denosumab injection What is this medicine? DENOSUMAB (den oh sue mab) slows bone breakdown. Prolia is used to treat osteoporosis in women after menopause and in men, and in people who are taking corticosteroids for 6 months or more. Delton See is used to treat a high calcium level due to cancer and to prevent bone fractures and other bone problems caused by multiple myeloma or cancer bone metastases. Delton See is also used to treat giant cell tumor of the bone. This medicine may be used for other purposes; ask your health care provider or pharmacist if you have questions. COMMON BRAND NAME(S): Prolia, XGEVA What should I tell my health care provider before I take this medicine? They need to know if you have any of these conditions:  dental disease  having surgery or tooth extraction  infection  kidney disease  low levels of calcium or Vitamin D in the blood  malnutrition  on hemodialysis  skin conditions or sensitivity  thyroid or parathyroid disease  an unusual reaction to denosumab, other medicines, foods, dyes, or preservatives  pregnant or trying to get pregnant  breast-feeding How should I use this medicine? This medicine is for injection under the skin. It is given by a health care professional in a hospital or clinic setting. A special MedGuide will be given to you before each treatment. Be sure to read this information carefully each time. For Prolia, talk to your pediatrician regarding the use of this medicine in children. Special care may be needed. For Delton See, talk to your pediatrician regarding the use of this medicine in children. While this drug may be prescribed for children as young as 13 years for selected conditions, precautions do apply. Overdosage: If you think you have taken too much of this medicine contact a poison control center or  emergency room at once. NOTE: This medicine is only for you. Do not share this medicine with others. What if I miss a dose? It is important not to miss your dose. Call your doctor or health care professional if you are unable to keep an appointment. What may interact with this medicine? Do not take this medicine with any of the following medications:  other medicines containing denosumab This medicine may also interact with the following medications:  medicines that lower your chance of fighting infection  steroid medicines like prednisone or cortisone This list may not describe all possible interactions. Give your health care provider a list of all the medicines, herbs, non-prescription drugs, or dietary supplements you use. Also tell them if you smoke, drink alcohol, or use illegal drugs. Some items may interact with your medicine. What should I watch for while using this medicine? Visit your doctor or health care professional for regular checks on your progress. Your doctor or health care professional may order blood tests and other tests to see how you are doing. Call your doctor or health care professional for advice if you get a fever, chills or sore throat, or other symptoms of a cold or flu. Do not treat yourself. This drug may decrease your body's ability to fight infection. Try to avoid being around people who are sick. You should make sure you get enough calcium and vitamin D while you are taking this medicine, unless your doctor tells you not to. Discuss the foods you eat and the vitamins you take with your health care professional. See  your dentist regularly. Brush and floss your teeth as directed. Before you have any dental work done, tell your dentist you are receiving this medicine. Do not become pregnant while taking this medicine or for 5 months after stopping it. Talk with your doctor or health care professional about your birth control options while taking this medicine. Women  should inform their doctor if they wish to become pregnant or think they might be pregnant. There is a potential for serious side effects to an unborn child. Talk to your health care professional or pharmacist for more information. What side effects may I notice from receiving this medicine? Side effects that you should report to your doctor or health care professional as soon as possible:  allergic reactions like skin rash, itching or hives, swelling of the face, lips, or tongue  bone pain  breathing problems  dizziness  jaw pain, especially after dental work  redness, blistering, peeling of the skin  signs and symptoms of infection like fever or chills; cough; sore throat; pain or trouble passing urine  signs of low calcium like fast heartbeat, muscle cramps or muscle pain; pain, tingling, numbness in the hands or feet; seizures  unusual bleeding or bruising  unusually weak or tired Side effects that usually do not require medical attention (report to your doctor or health care professional if they continue or are bothersome):  constipation  diarrhea  headache  joint pain  loss of appetite  muscle pain  runny nose  tiredness  upset stomach This list may not describe all possible side effects. Call your doctor for medical advice about side effects. You may report side effects to FDA at 1-800-FDA-1088. Where should I keep my medicine? This medicine is only given in a clinic, doctor's office, or other health care setting and will not be stored at home. NOTE: This sheet is a summary. It may not cover all possible information. If you have questions about this medicine, talk to your doctor, pharmacist, or health care provider.  2020 Elsevier/Gold Standard (2017-06-07 16:10:44)

## 2019-05-08 NOTE — Progress Notes (Signed)
BP 131/77   Pulse 88   Temp 99.2 F (37.3 C) (Oral)   Ht 5' 5.5" (1.664 m)   Wt 202 lb 9.6 oz (91.9 kg)   LMP  (LMP Unknown)   SpO2 95%   BMI 33.20 kg/m    Subjective:    Patient ID: Marie Perry, female    DOB: 1950/09/04, 69 y.o.   MRN: [id number]  HPI: Marie Perry is a 69 y.o. female presenting on 05/08/2019 for comprehensive medical examination. Current medical complaints include:none  She currently lives with: mother Menopausal Symptoms: no   The 10-year ASCVD risk score [name] DC [name]., et al., 2013) is: 8.9%   Values used to calculate the score:     Age: 40 years     Sex: Female     Is Non-Hispanic African American: No     Diabetic: No     Tobacco smoker: No     Systolic Blood Pressure: A999333 mmHg     Is BP treated: No     HDL Cholesterol: 41 mg/dL     Total Cholesterol: 210 mg/dL   VITAMIN D DEFICIENCY AND B12:  Takes daily supplements.  No recent falls or fractures.  Occasional muscle aches.    OSTEOPOROSIS In 2019 DEXA noted this with T -2.9.  Tried medications, but caused GI upset and GERD.   Satisfied with current treatment?: no current treatment Past osteoporosis medications/treatments: Fosamax Adequate calcium & vitamin D: yes Intolerance to bisphosphonates:yes Weight bearing exercises: no   GERD Took Prilosec before, but came off of this years ago.  Has CPAP and used 100% of the time.  She is interested in starting back on Prilosec. GERD control status: uncontrolled  Satisfied with current treatment? 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 Has ongoing issues with anxiety/depression due to caregiver strain. Current medications include Cymbalta 60 MG  and Buspar10MG  BID (increased to this in May).Endorses this has been a difficult time due to not being able to get out on occasion due to concern for exposing her mother. Reports she has minimal socialization recently.States that increase in Buspar has helped, less tearful. Referral placed for therapy last visit, but never heard from female therapist which she prefers. Mood status:stable Satisfied with current treatment?:yes Symptom severity:moderate Duration of current treatment :chronic Side effects:no Medication compliance:good compliance Psychotherapy/counseling:currently with the SW Previous psychiatric medications:Cymbalta and Buspar Depressed mood:yes Anxious mood:no Anhedonia:no Significant weight loss or gain:no Insomnia:at times has issues falling asleep Fatigue:no Feelings of worthlessness or guilt:no Impaired concentration/indecisiveness:yes Suicidal ideations:no Hopelessness:no Crying spells:at times, but reports these have improved with increase in Buspar Depression screen [facility] 2/9 05/08/2019 03/05/2019 01/13/2019 10/10/2018 06/30/2018  Decreased Interest 1 0 1 1 1   Down, Depressed, Hopeless 1 1 0 1 1  PHQ - 2 Score 2 1 1 2 2   Altered sleeping 1 - 1 2 3   Tired, decreased energy 1 - 3 3 3   Change in appetite 1 - 2 1 3   Feeling bad or failure about yourself  1 - 1 1 1   Trouble concentrating 1 - 1 1 1   Moving slowly or fidgety/restless 0 - 0 0 1  Suicidal thoughts 0 - 0 0 0  PHQ-9 Score 7 - 9 10 14  Difficult doing work/chores Not difficult at all - Somewhat difficult Somewhat difficult Very difficult  Some recent data might be hidden    The patient does not have a history of falls. I did not complete a risk assessment for falls. A plan of care for falls was not documented.   Past Medical History:  Past Medical History:  Diagnosis Date  . Allergy   . Anxiety   . Arthritis   . Cataract 02/2016   Yearly eye exam w/Dr. Ellin Mayhew  . 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 Depression   . GERD (gastroesophageal reflux disease)   . Hypertriglyceridemia   . IBS (irritable bowel syndrome)   . IFG (impaired fasting glucose)   . Osteopenia   . Sleep apnea   . Vitamin D deficiency     Surgical History:  Past Surgical History:  Procedure Laterality Date  . bright's surgery    . COLONOSCOPY N/A 09/17/2016   Procedure: COLONOSCOPY;  Surgeon: Lollie Sails, MD;  Location: [facility] ENDOSCOPY;  Service: Endoscopy;  Laterality: N/A;  . COLONOSCOPY WITH PROPOFOL N/A 04/15/2017   Procedure: COLONOSCOPY WITH PROPOFOL;  Surgeon: Lollie Sails, MD;  Location[facility] ENDOSCOPY;  Service: Endoscopy;  Laterality: N/A;  . SKIN TAG REMOVAL     rectal    Medications:  Current Outpatient Medications on File Prior to Visit  Medication Sig  . busPIRone (BUSPAR) 10 MG tablet Take 1 tablet (10 mg total) by mouth 2 (two) times daily.  . Cholecalciferol (VITAMIN D PO) Take 2,000 Units by mouth daily.   . Cyanocobalamin (B-12 PO) Take 1,000 mcg by mouth daily.   . DULoxetine (CYMBALTA) 60 MG capsule Take 1 capsule (60 mg total) by mouth daily.   No current facility-administered medications on file prior to visit.    Allergies:  No Known Allergies  Social History:  Social History   Socioeconomic History  . Marital status: Single    Spouse name: Not on file  . Number of children: 0  . Years of education: Not on file  . Highest education level: Associate degree: academic program  Occupational History  . Occupation: retired    Comment: works part time  Tobacco Use  . Smoking status: Never Smoker  . Smokeless tobacco: Never Used  Substance and Sexual Activity  . Alcohol use: Yes    Alcohol/week: 0.0 standard drinks    Comment: rarely indulge  . Drug use: Never  . Sexual activity: Never  Other Topics Concern  . Not on file  Social History Narrative  . Not on file   Social Determinants of Health   Financial  Resource Strain:   . Difficulty of Paying Living Expenses:   Food Insecurity:   . Worried About Charity fundraiser in the Last Year:   . Arboriculturist in the Last Year:   Transportation Needs:   . Film/video editor (Medical):   [name] Lack of Transportation (Non-Medical):   Physical Activity:   . Days of Exercise per Week:   . Minutes of Exercise per Session:   Stress:   . Feeling of Stress :   Social Connections:   . Frequency of Communication with Friends and Family:   . Frequency of Social Gatherings with Friends and Family:   . Attends Religious Services:   . Active Member of Clubs or Organizations:   . Attends Archivist Meetings:   [name] Marital Status:  Intimate Partner Violence:   . Fear of Current or Ex-Partner:   . Emotionally Abused:   [name] Physically Abused:   . Sexually Abused:    Social History   Tobacco Use  Smoking Status Never Smoker  Smokeless Tobacco Never Used   Social History   Substance and Sexual Activity  Alcohol Use Yes  . Alcohol/week: 0.0 standard drinks   Comment: rarely indulge    Family History:  Family History  Problem Relation Age of Onset  . Mental illness Mother   . Depression Mother   . Anxiety disorder Mother   . Hearing loss Mother   . Miscarriages / [place] Mother   . Vision loss Mother   . Stroke Father   . Heart disease Father   . Hearing loss Father   . Vision loss Father   . Cancer Maternal Grandmother        ovarian  . Anemia Maternal Grandmother   . Arthritis Maternal Grandmother   . Lung disease Maternal Grandfather   . Birth defects Maternal Grandfather   . Asthma Maternal Grandfather   . Depression Sister   . Fibromyalgia Brother   . GER disease Brother   . Heart disease Paternal Grandmother   . Birth defects Paternal Grandfather   . Depression Brother   . Asthma Paternal Uncle   . Birth defects Sister   . Early death Sister   . Cancer Maternal Uncle   . Hearing loss Maternal Uncle   .  Kidney disease Maternal Uncle   . Depression Brother   . Early death Paternal Uncle   . Hearing loss Maternal Aunt   . Vision loss Maternal Aunt   . Varicose Veins Maternal Aunt   . Heart disease Paternal Uncle   . Stroke Paternal Uncle   . Breast cancer Neg Hx     Past medical history, surgical history, medications, allergies, family history and social history reviewed with patient today and changes made to appropriate areas of the chart.   Review of Systems - negative All other ROS negative except what is listed above and in the HPI.      Objective:    BP 131/77   Pulse 88   Temp 99.2 F (37.3 C) (Oral)   Ht 5' 5.5" (1.664 m)   Wt 202 lb 9.6 oz (91.9 kg)   LMP  (LMP Unknown)   SpO2 95%   BMI 33.20 kg/m   Wt Readings from Last 3 Encounters:  05/08/19 202 lb 9.6 oz (91.9 kg)  04/07/18 195 lb (88.5 kg)  02/28/18 188 lb (85.3 kg)    Physical Exam Constitutional:      General: She is awake. She is not in acute distress.    Appearance: She is well-developed. She is not ill-appearing.  HENT:     Head: Normocephalic and atraumatic.     Right Ear: Hearing, tympanic membrane, ear canal and external ear normal. No drainage.     Left Ear: Hearing, tympanic membrane, ear canal and external ear normal. No drainage.     Nose: Nose normal.     Right Sinus: No maxillary sinus tenderness or frontal sinus tenderness.     Left Sinus: No maxillary sinus tenderness or frontal sinus tenderness.     Mouth/Throat:     Mouth: Mucous membranes are moist.     Pharynx: Oropharynx is clear. Uvula midline. No pharyngeal swelling, oropharyngeal exudate or posterior oropharyngeal erythema.  Eyes:     General: Lids are normal.  Right eye: No discharge.        Left eye: No discharge.     Extraocular Movements: Extraocular movements intact.     Conjunctiva/sclera: Conjunctivae normal.     Pupils: Pupils are equal, round, and reactive to light.     Visual Fields: Right eye visual fields  normal and left eye visual fields normal.  Neck:     Thyroid: No thyromegaly.     Vascular: No carotid bruit.     Trachea: Trachea normal.  Cardiovascular:     Rate and Rhythm: Normal rate and regular rhythm.     Heart sounds: Normal heart sounds. No murmur. No gallop.   Pulmonary:     Effort: Pulmonary effort is normal. No accessory muscle usage or respiratory distress.     Breath sounds: Normal breath sounds.  Chest:     Breasts:        Right: Normal.        Left: Normal.     Comments: Multiple skin tags around breast area. Abdominal:     General: Bowel sounds are normal.     Palpations: Abdomen is soft. There is no hepatomegaly or splenomegaly.     Tenderness: There is no abdominal tenderness.  Musculoskeletal:        General: Normal range of motion.     Cervical back: Normal range of motion and neck supple.     Right lower leg: No edema.     Left lower leg: No edema.  Lymphadenopathy:     Head:     Right side of head: No submental, submandibular, tonsillar, preauricular or posterior auricular adenopathy.     Left side of head: No submental, submandibular, tonsillar, preauricular or posterior auricular adenopathy.     Cervical: No cervical adenopathy.     Upper Body:     Right upper body: No supraclavicular, axillary or pectoral adenopathy.     Left upper body: No supraclavicular, axillary or pectoral adenopathy.  Skin:    General: Skin is warm and dry.     Capillary Refill: Capillary refill takes less than 2 seconds.     Findings: No rash.  Neurological:     Mental Status: She is alert and oriented to person, place, and time.     Cranial Nerves: Cranial nerves are intact.     Gait: Gait is intact.     Deep Tendon Reflexes: Reflexes are normal and symmetric.     Reflex Scores:      Brachioradialis reflexes are 2+ on the right side and 2+ on the left side.      Patellar reflexes are 2+ on the right side and 2+ on the left side. Psychiatric:        Attention and  Perception: Attention normal.        Mood and Affect: Mood normal.        Speech: Speech normal.        Behavior: Behavior normal. Behavior is cooperative.        Thought Content: Thought content normal.        Judgment: Judgment normal.     Results for orders placed or performed in visit on 02/28/18  CBC with Differential/Platelet  Result Value Ref Range   WBC 9.3 3.4 - 10.8 x10E3/uL   RBC 4.46 3.77 - 5.28 x10E6/uL   Hemoglobin 13.4 11.1 - 15.9 g/dL   Hematocrit 39.9 34.0 - 46.6 %   MCV 90 79 - 97 fL   MCH 30.0 26.6 -  33.0 pg   MCHC 33.6 31.5 - 35.7 g/dL   RDW 13.2 11.7 - 15.4 %   Platelets 223 150 - 450 x10E3/uL   Neutrophils 65 Not Estab. %   Lymphs 26 Not Estab. %   Monocytes 7 Not Estab. %   Eos 2 Not Estab. %   Basos 0 Not Estab. %   Neutrophils Absolute 6.0 1.4 - 7.0 x10E3/uL   Lymphocytes Absolute 2.4 0.7 - 3.1 x10E3/uL   Monocytes Absolute 0.6 0.1 - 0.9 x10E3/uL   EOS (ABSOLUTE) 0.2 0.0 - 0.4 x10E3/uL   Basophils Absolute 0.0 0.0 - 0.2 x10E3/uL   Immature Granulocytes 0 Not Estab. %   Immature Grans (Abs) 0.0 0.0 - 0.1 x10E3/uL  Comprehensive metabolic panel  Result Value Ref Range   Glucose 84 65 - 99 mg/dL   BUN 18 8 - 27 mg/dL   Creatinine, Ser 1.10 (H) 0.57 - 1.00 mg/dL   GFR calc non Af Amer 52 (L) >59 mL/min/1.73   GFR calc Af Amer 60 >59 mL/min/1.73   BUN/Creatinine Ratio 16 12 - 28   Sodium 140 134 - 144 mmol/L   Potassium 4.4 3.5 - 5.2 mmol/L   Chloride 104 96 - 106 mmol/L   CO2 23 20 - 29 mmol/L   Calcium 9.8 8.7 - 10.3 mg/dL   Total Protein 6.6 6.0 - 8.5 g/dL   Albumin 4.2 3.6 - 4.8 g/dL   Globulin, Total 2.4 1.5 - 4.5 g/dL   Albumin/Globulin Ratio 1.8 1.2 - 2.2   Bilirubin Total 0.4 0.0 - 1.2 mg/dL   Alkaline Phosphatase 76 39 - 117 IU/L   AST 16 0 - 40 IU/L   ALT 20 0 - 32 IU/L  Lipid Panel w/o Chol/HDL Ratio  Result Value Ref Range   Cholesterol, Total 210 (H) 100 - 199 mg/dL   Triglycerides 320 (H) 0 - 149 mg/dL   HDL 41 >39 mg/dL    VLDL Cholesterol Cal 64 (H) 5 - 40 mg/dL   LDL Calculated 105 (H) 0 - 99 mg/dL  TSH  Result Value Ref Range   TSH 1.700 0.450 - 4.500 uIU/mL  VITAMIN D 25 Hydroxy (Vit-D Deficiency, Fractures)  Result Value Ref Range   Vit D, 25-Hydroxy 28.5 (L) 30.0 - 100.0 ng/mL  Vitamin B12  Result Value Ref Range   Vitamin B-12 1,587 (H) 232 - 1,245 pg/mL      Assessment & Plan:   Problem List Items Addressed This Visit      Respiratory   Sleep apnea    Wears CPAP 100% of the time, continue this regimen.      Relevant Orders   TSH     Digestive   GERD (gastroesophageal reflux disease)    Chronic, ongoing.  Would like to restart Prilosec due to return of heart burn.  Script sent for Prilosec 20 MG daily, will adjust as needed.  She would like to try for short term.      Relevant Medications   omeprazole (PRILOSEC) 20 MG capsule   Other Relevant Orders   TSH     Musculoskeletal and Integument   Osteoporosis    Chronic, ongoing with poor tolerance to bisphosphonate in past.  Could consider Prolia, which was discuss with her and provided her with education on this.  Continue to discuss with her at visits.        Other   Recurrent major depression-severe (HCC)    Chronic, stable.  Denies SI/HI.  Will continue  current medication regimen at this time and adjust as needed.  Referral to therapy placed last visit and will place new referral today.  May consider switch from Cymbalta to Sertraline in future if exacerbation.  Return in 3 months or sooner if worsening.      Relevant Orders   TSH   Vitamin D deficiency    Ongoing with underlying osteoporosis and poor tolerance Fosamax in past.  Continues on daily supplement.  Vit D level today.      Relevant Orders   VITAMIN D 25 Hydroxy (Vit-D Deficiency, Fractures)   Obesity (BMI 30.0-34.9)    Recommend continued focus on healthy diet choices and regular physical activity (30 minutes 5 days a week).  Focus on small goals and set  timelines to achieve.       Relevant Orders   CBC with Differential/Platelet   TSH   Hypertriglyceridemia    Ongoing.  Lipid panel today.  Continues to monitor diet. No medication at this time.  Consider initiation of medication if elevations noted or elevated ASCVD.      Relevant Orders   Comprehensive metabolic panel   Lipid Panel w/o Chol/HDL Ratio   Vitamin B12 deficiency    Chronic, stable.  Continue daily supplement and recheck B12 level today, adjust as needed.      Relevant Orders   Vitamin B12    Other Visit Diagnoses    Encounter for general medical examination    -  Primary   Encounter for screening mammogram for malignant neoplasm of breast       Relevant Orders   MM DIGITAL SCREENING BILATERAL       Follow up plan: Return in about 6 months (around 11/08/2019) for MOOD and HLD +GERD.   LABORATORY TESTING:  - Pap smear: not applicable  IMMUNIZATIONS:   - Tdap: Tetanus vaccination status reviewed: last tetanus booster within 10 years. - Influenza: Up to date - Pneumovax: Up to date - Prevnar: Up to date - HPV: Not applicable - Zostavax vaccine: Up to date  SCREENING: -Mammogram: Ordered today  - Colonoscopy: Up to date  - Bone Density: Up to date due next in 2022 -Hearing Test: Not applicable  -Spirometry: Not applicable   PATIENT COUNSELING:   Advised to take 1 mg of folate supplement per day if capable of pregnancy.   Sexuality: Discussed sexually transmitted diseases, partner selection, use of condoms, avoidance of unintended pregnancy  and contraceptive alternatives.   Advised to avoid cigarette smoking.  I discussed with the patient that most people either abstain from alcohol or drink within safe limits (<=14/week and <=4 drinks/occasion for males, <=7/weeks and <= 3 drinks/occasion for females) and that the risk for alcohol disorders and other health effects rises proportionally with the number of drinks per week and how often a drinker  exceeds daily limits.  Discussed cessation/primary prevention of drug use and availability of treatment for abuse.   Diet: Encouraged to adjust caloric intake to maintain  or achieve ideal body weight, to reduce intake of dietary saturated fat and total fat, to limit sodium intake by avoiding high sodium foods and not adding table salt, and to maintain adequate dietary potassium and calcium preferably from fresh fruits, vegetables, and low-fat dairy products.    stressed the importance of regular exercise  Injury prevention: Discussed safety belts, safety helmets, smoke detector, smoking near bedding or upholstery.   Dental health: Discussed importance of regular tooth brushing, flossing, and dental visits.    NEXT  PREVENTATIVE PHYSICAL DUE IN 1 YEAR. Return in about 6 months (around 11/08/2019) for MOOD and HLD +GERD.

## 2019-05-08 NOTE — Assessment & Plan Note (Signed)
Ongoing with underlying osteoporosis and poor tolerance Fosamax in past.  Continues on daily supplement.  Vit D level today. ?

## 2019-05-08 NOTE — Assessment & Plan Note (Signed)
Wears CPAP 100% of the time, continue this regimen.

## 2019-05-08 NOTE — Assessment & Plan Note (Signed)
Chronic, ongoing.  Would like to restart Prilosec due to return of heart burn.  Script sent for Prilosec 20 MG daily, will adjust as needed.  She would like to try for short term.

## 2019-05-09 LAB — CBC WITH DIFFERENTIAL/PLATELET
Basophils Absolute: 0 10*3/uL (ref 0.0–0.2)
Basos: 0 %
EOS (ABSOLUTE): 0.2 10*3/uL (ref 0.0–0.4)
Eos: 2 %
Hematocrit: 46.3 % (ref 34.0–46.6)
Hemoglobin: 15.3 g/dL (ref 11.1–15.9)
Immature Grans (Abs): 0 10*3/uL (ref 0.0–0.1)
Immature Granulocytes: 0 %
Lymphocytes Absolute: 2.3 10*3/uL (ref 0.7–3.1)
Lymphs: 29 %
MCH: 29.8 pg (ref 26.6–33.0)
MCHC: 33 g/dL (ref 31.5–35.7)
MCV: 90 fL (ref 79–97)
Monocytes Absolute: 0.5 10*3/uL (ref 0.1–0.9)
Monocytes: 6 %
Neutrophils Absolute: 4.8 10*3/uL (ref 1.4–7.0)
Neutrophils: 63 %
Platelets: 248 10*3/uL (ref 150–450)
RBC: 5.14 x10E6/uL (ref 3.77–5.28)
RDW: 13 % (ref 11.7–15.4)
WBC: 7.8 10*3/uL (ref 3.4–10.8)

## 2019-05-09 LAB — COMPREHENSIVE METABOLIC PANEL
ALT: 21 IU/L (ref 0–32)
AST: 23 IU/L (ref 0–40)
Albumin/Globulin Ratio: 1.8 (ref 1.2–2.2)
Albumin: 4.6 g/dL (ref 3.8–4.8)
Alkaline Phosphatase: 92 IU/L (ref 39–117)
BUN/Creatinine Ratio: 21 (ref 12–28)
BUN: 20 mg/dL (ref 8–27)
Bilirubin Total: 0.6 mg/dL (ref 0.0–1.2)
CO2: 23 mmol/L (ref 20–29)
Calcium: 9.9 mg/dL (ref 8.7–10.3)
Chloride: 104 mmol/L (ref 96–106)
Creatinine, Ser: 0.96 mg/dL (ref 0.57–1.00)
GFR calc Af Amer: 70 mL/min/{1.73_m2} (ref >59)
GFR calc non Af Amer: 61 mL/min/{1.73_m2} (ref >59)
Globulin, Total: 2.5 g/dL (ref 1.5–4.5)
Glucose: 95 mg/dL (ref 65–99)
Potassium: 4.1 mmol/L (ref 3.5–5.2)
Sodium: 141 mmol/L (ref 134–144)
Total Protein: 7.1 g/dL (ref 6.0–8.5)

## 2019-05-09 LAB — LIPID PANEL W/O CHOL/HDL RATIO
Cholesterol, Total: 217 mg/dL — ABNORMAL HIGH (ref 100–199)
HDL: 44 mg/dL (ref >39)
LDL Chol Calc (NIH): 129 mg/dL — ABNORMAL HIGH (ref 0–99)
Triglycerides: 246 mg/dL — ABNORMAL HIGH (ref 0–149)
VLDL Cholesterol Cal: 44 mg/dL — ABNORMAL HIGH (ref 5–40)

## 2019-05-09 LAB — VITAMIN B12: Vitamin B-12: 1115 pg/mL (ref 232–1245)

## 2019-05-09 LAB — VITAMIN D 25 HYDROXY (VIT D DEFICIENCY, FRACTURES): Vit D, 25-Hydroxy: 32.2 ng/mL (ref 30.0–100.0)

## 2019-05-09 LAB — TSH: TSH: 3.41 u[IU]/mL (ref 0.450–4.500)

## 2019-05-09 NOTE — Progress Notes (Signed)
Contacted via MyChart The 10-year ASCVD risk score [name] DC Jr., et al., 2013) is: 8.8%   Values used to calculate the score:     Age: 69 years     Sex: Female     Is Non-Hispanic African American: No     Diabetic: No     Tobacco smoker: No     Systolic Blood Pressure: A999333 mmHg     Is BP treated: No     HDL Cholesterol: 44 mg/dL     Total Cholesterol: 217 mg/dL

## 2019-05-18 ENCOUNTER — Ambulatory Visit (INDEPENDENT_AMBULATORY_CARE_PROVIDER_SITE_OTHER): Payer: Medicare Other | Admitting: Licensed Clinical Social Worker

## 2019-05-18 DIAGNOSIS — G4733 Obstructive sleep apnea (adult) (pediatric): Secondary | ICD-10-CM

## 2019-05-18 DIAGNOSIS — F332 Major depressive disorder, recurrent severe without psychotic features: Secondary | ICD-10-CM

## 2019-05-18 DIAGNOSIS — M810 Age-related osteoporosis without current pathological fracture: Secondary | ICD-10-CM

## 2019-05-18 DIAGNOSIS — J309 Allergic rhinitis, unspecified: Secondary | ICD-10-CM

## 2019-05-18 NOTE — Chronic Care Management (AMB) (Signed)
Chronic Care Management    Clinical Social Work Follow Up Note  05/18/2019 Name: Marie Perry MRN: [id number] DOB: [birth year]  Marie Perry is a 69 y.o. year old female who is a primary care patient of Cannady, Barbaraann Faster, NP. The CCM team was consulted for assistance with Mental Health Counseling and Resources.   Review of patient status, including review of consultants reports, other relevant assessments, and collaboration with appropriate care team members and the patient's provider was performed as part of comprehensive patient evaluation and provision of chronic care management services.    SDOH (Social Determinants of Health) assessments performed: Yes    Outpatient Encounter Medications as of 05/18/2019  Medication Sig  . busPIRone (BUSPAR) 10 MG tablet Take 1 tablet (10 mg total) by mouth 2 (two) times daily.  . Cholecalciferol (VITAMIN D PO) Take 2,000 Units by mouth daily.   . Cyanocobalamin (B-12 PO) Take 1,000 mcg by mouth daily.   . DULoxetine (CYMBALTA) 60 MG capsule Take 1 capsule (60 mg total) by mouth daily.  [name] omeprazole (PRILOSEC) 20 MG capsule Take 1 capsule (20 mg total) by mouth daily.   No facility-administered encounter medications on file as of 05/18/2019.     Goals Addressed    . "I wouldn't mind having some extra help managing my depression and stress" (pt-stated)       Current Barriers:  [name] Knowledge Deficits related to long term strategies for management of depression and caregiver stress . Lacks caregiver support. 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   Case Manager Clinical Goal(s):  [name] Over the next 90 days, patient will verbalize basic understanding of depression/stress process and self health management plan as evidenced by her participation in development of long term plan of care and institution of self health management strategies . Over the next 90 days, patient will be educated on healthy coping skills and will implement  these appropriate self care methods into her daily routine to combat depression/anxiety. . Over the next 90 days, patient will be educated on appropriate mental health resources that are available to her as well as socialization opportunities.   Interventions:  . Evaluation of current treatment plan related to depression/caregiver stress and patient's adherence to plan as established by provider. . Patient interviewed and appropriate assessments performed . Provided mental health counseling with regard to depression/caregiver stress. Patient reports that her recent medication dosage increase is helping with her anxiety but not with her depression. Patient shares that she is not experiencing as much crying spells as before which is a great relief to her as they were mentally draining for her to experience daily.  . Education provided on how to implement appropriate self-care coping tools to combat triggers and stress. Patient receptive to education and reports implementing appropriate self-care by reading and spending time outside.  . Discussed plans with patient for ongoing care management follow up and provided patient with direct contact information for care management team . Advised patient to contact LCSW for any ugrent social work needs . Assisted patient/caregiver with obtaining information about health plan benefits . Provided mental health counseling with regard to patient was emotional throughout phone call due to her daily difficulty with depression/anxiety and stress from being a caregiver. LCSW provided reflective listening and implemented appropriate interventions to help suppport patient and her emotional needs  . Patient reports that she is fully COVID vaccinated which has decreased some of her anxiety.  . Patient denies any SI/HI .  Patient admits that she has not been exercising or eating the best. Patient reports her only walking is to the mailbox. Self-care education provided. [name] LCSW  provided mental health resource education and reminded patient that PCP sent behavioral health referral for therapy at last PCP visit. However, patient has not heard from them yet. LCSW provided patient with Dr. Karleen Dolphin contact information as she believes this is the referral that her PCP sent. LCSW will coordinate with PCP. Patient is agreeable to contact them Dr. Nicolasa Ducking to check on referral as well. Patient only prefers telehealth therapy due to fear of getting COVID.   Patient Self Care Activities:  . Performs ADL's independently . Calls provider office for new concerns or questions . Attends all scheduled provider appointments  Plan:  . Patient will call provider practice for new needs/concerns. [name] LCSW willfollow up with patient next month to provide ongoing mental health resource education and support  Please see past updates related to this goal by clicking on the "Past Updates" button in the selected goal         Follow Up Plan: SW will follow up with patient by phone over the next quarter  Eula Fried, [place], MSW, [email or web address] Phone: [phone]

## 2019-07-10 ENCOUNTER — Telehealth: Payer: Self-pay

## 2019-07-27 DIAGNOSIS — H25013 Cortical age-related cataract, bilateral: Secondary | ICD-10-CM | POA: Diagnosis not present

## 2019-07-27 DIAGNOSIS — H1045 Other chronic allergic conjunctivitis: Secondary | ICD-10-CM | POA: Diagnosis not present

## 2019-07-27 DIAGNOSIS — H2513 Age-related nuclear cataract, bilateral: Secondary | ICD-10-CM | POA: Diagnosis not present

## 2019-08-12 IMAGING — CR DG WRIST COMPLETE 3+V*L*
3 series · 3 of 3 positions shown · non-contrast
Comparison: None.

CLINICAL DATA: Patient fell 1 hour ago.  Swelling and wrist pain.

EXAM:
LEFT WRIST - COMPLETE 3+ VIEW

[wrist pa]
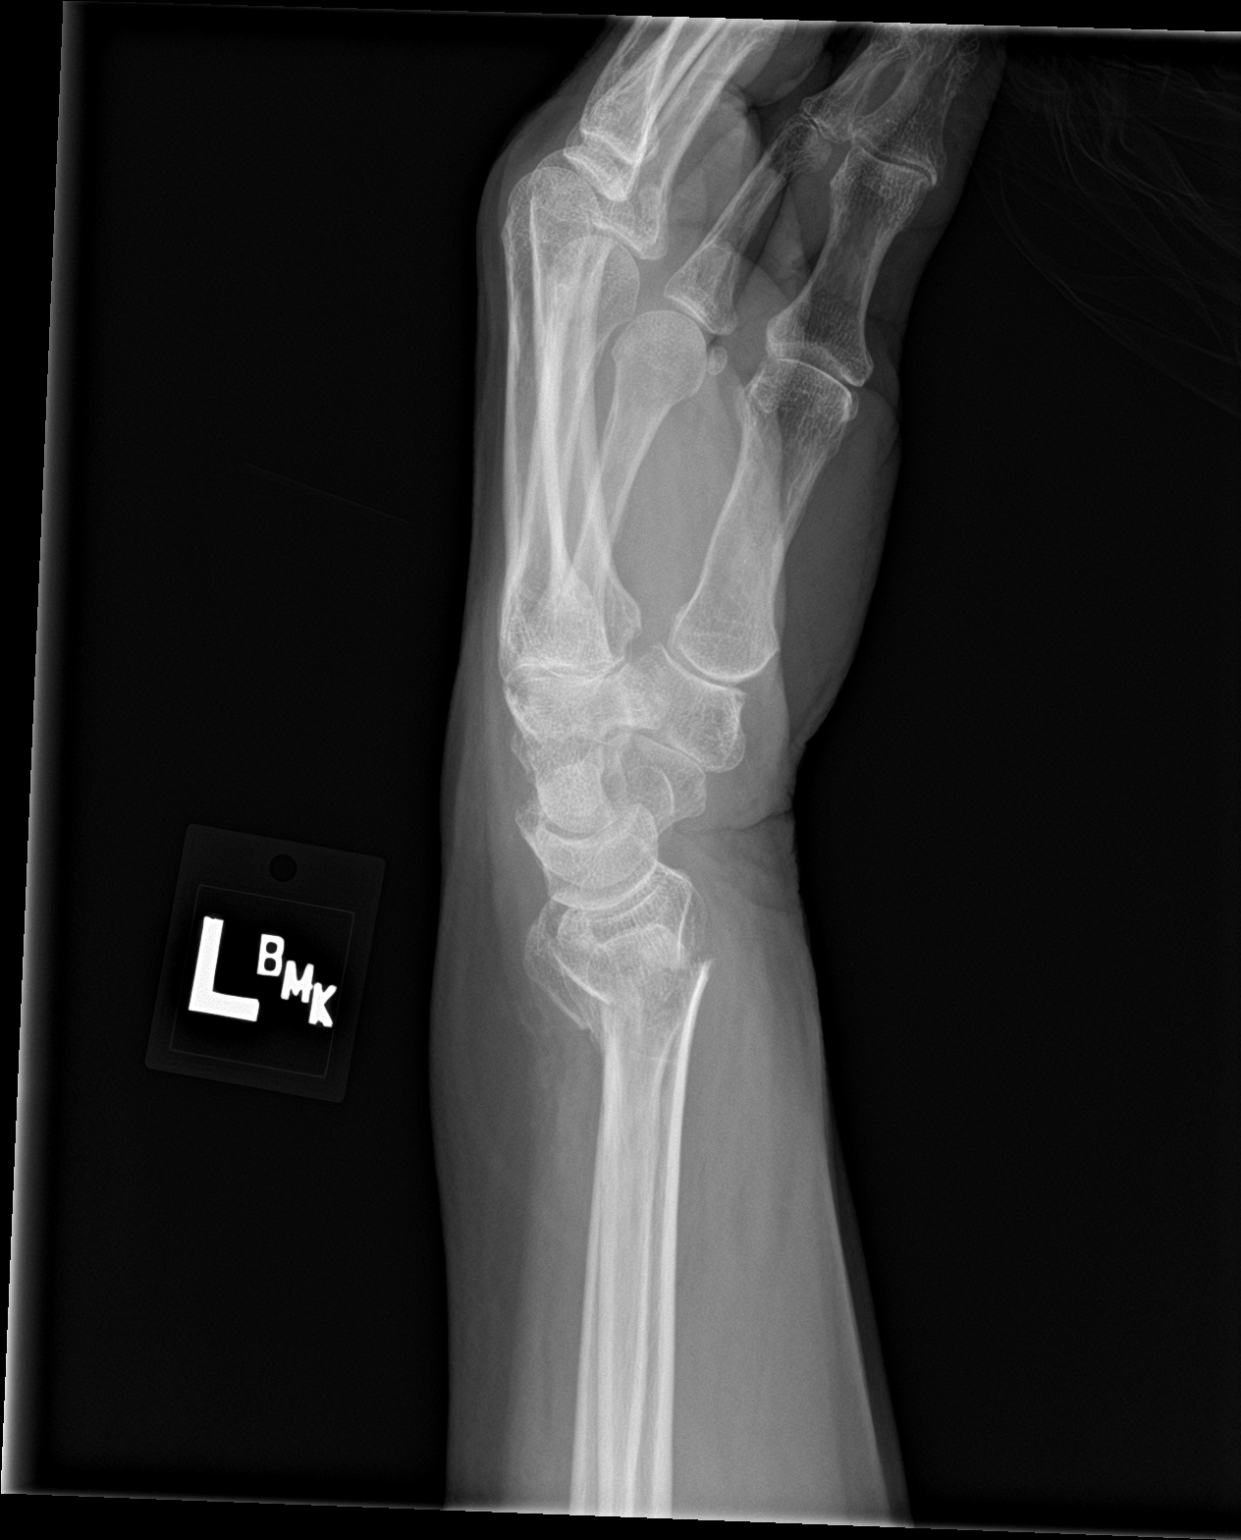

[wrist obl]
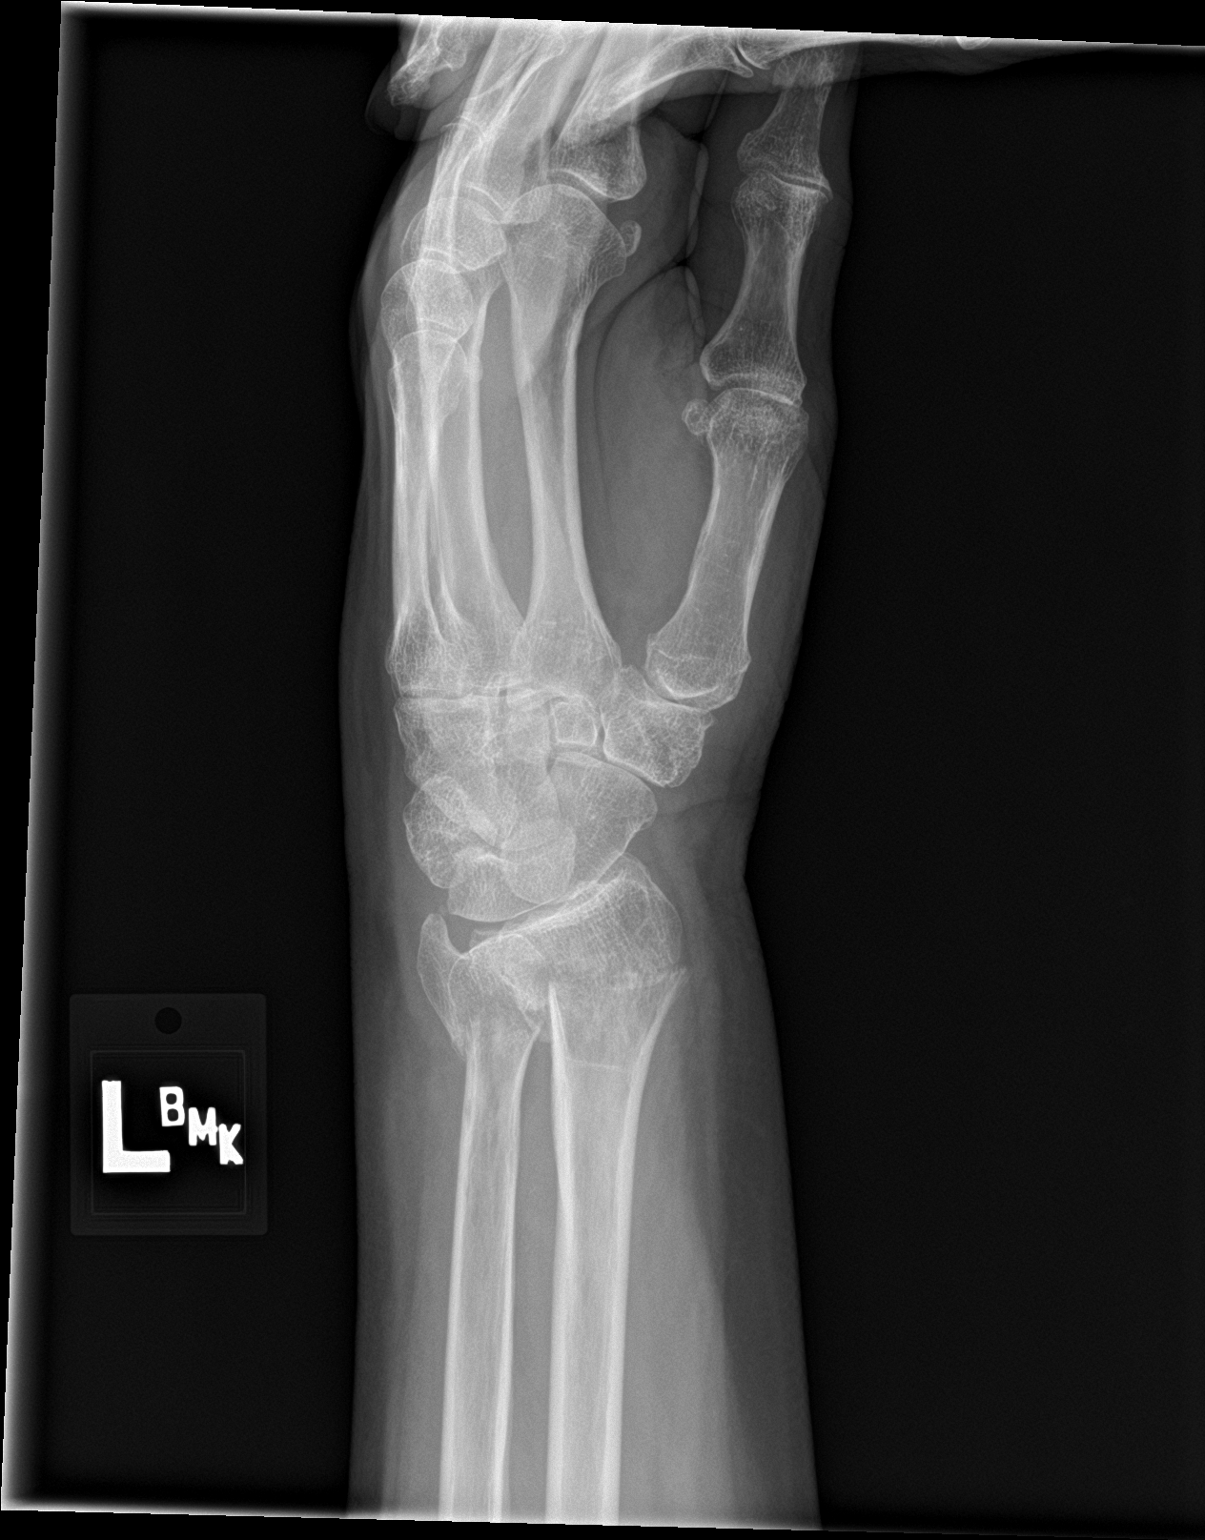

[wrist lat]
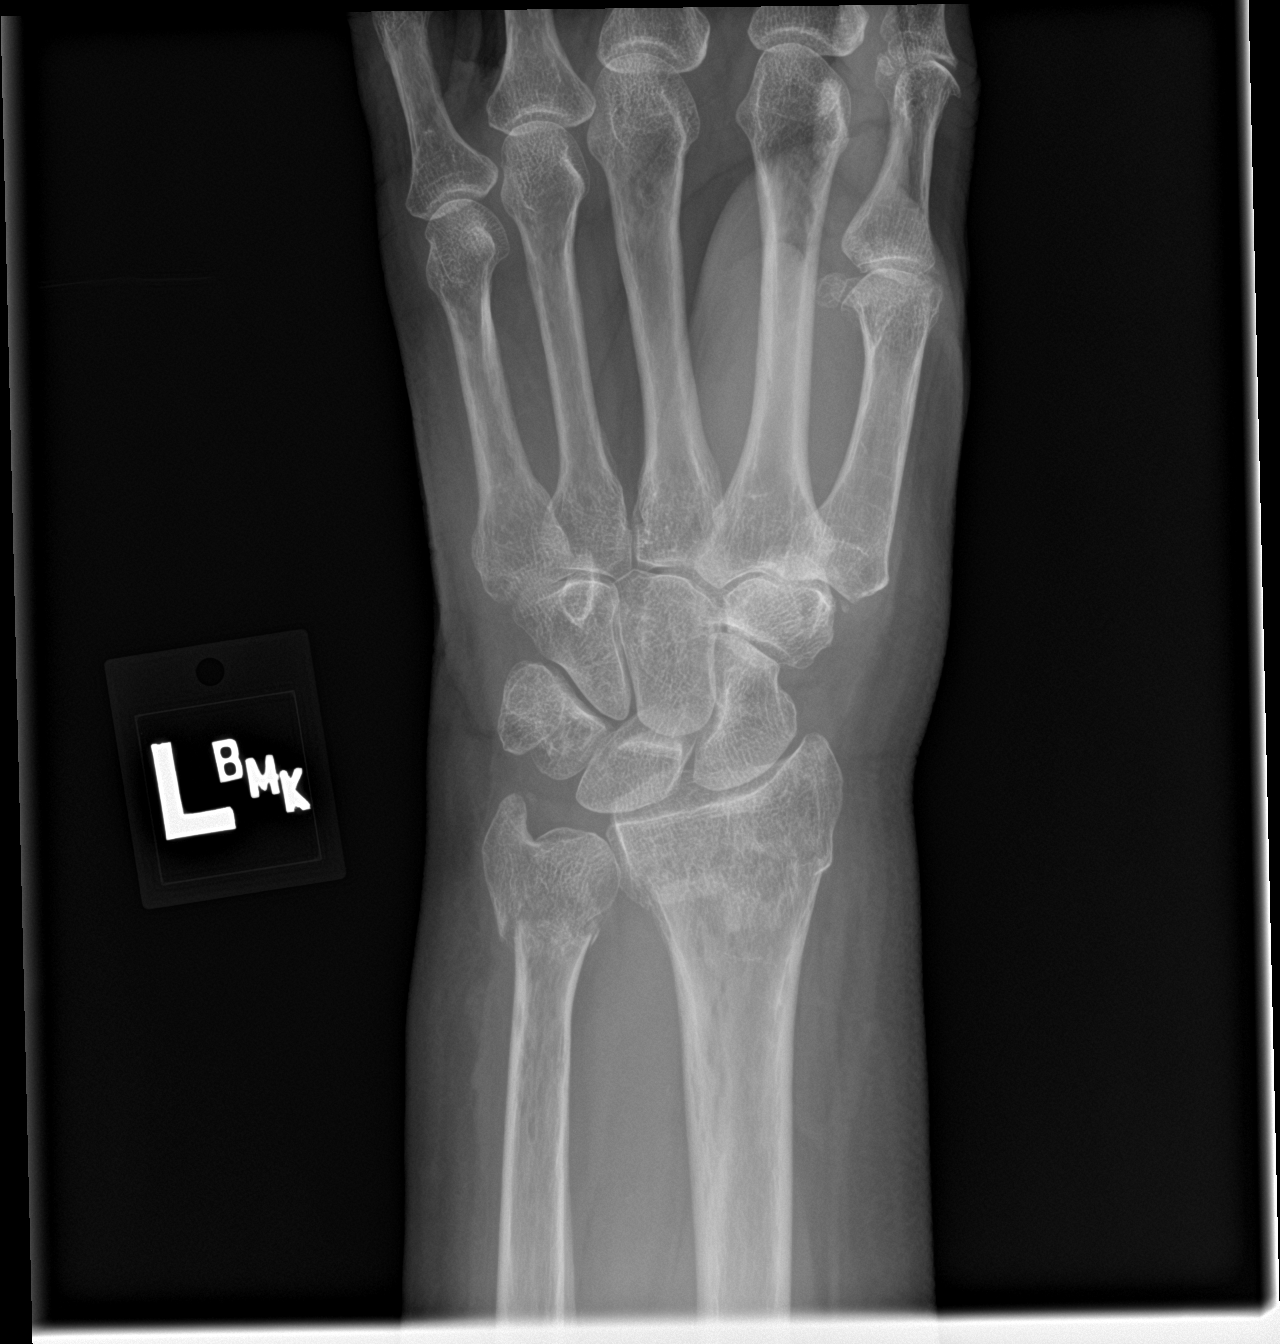

[3 of 3 positions shown; findings below may reference images not displayed]

FINDINGS: Acute, closed, dorsally angulated fractures of the distal radius and
ulna are noted with intra-articular extension of radius fracture
into the radiocarpal joint at the level of the proximal scaphoid.
Small accessory ossicle is seen off the tip of the ulnar styloid.
Carpal rows are maintained. Soft tissue swelling is noted about the
distal forearm.
IMPRESSION: Acute distal radius and ulnar fractures with dorsal angulation and
intra-articular extension of radius fracture into the radiocarpal
joint.

## 2019-08-13 IMAGING — DX DG WRIST COMPLETE 3+V*L*
4 series · 4 of 4 positions shown · non-contrast
Comparison: Pre reduction radiographs

CLINICAL DATA: Reduction and cast placement of distal radius and
ulnar fracture.

EXAM:
LEFT WRIST - COMPLETE 3+ VIEW

[wrist ap (1 of 2)]
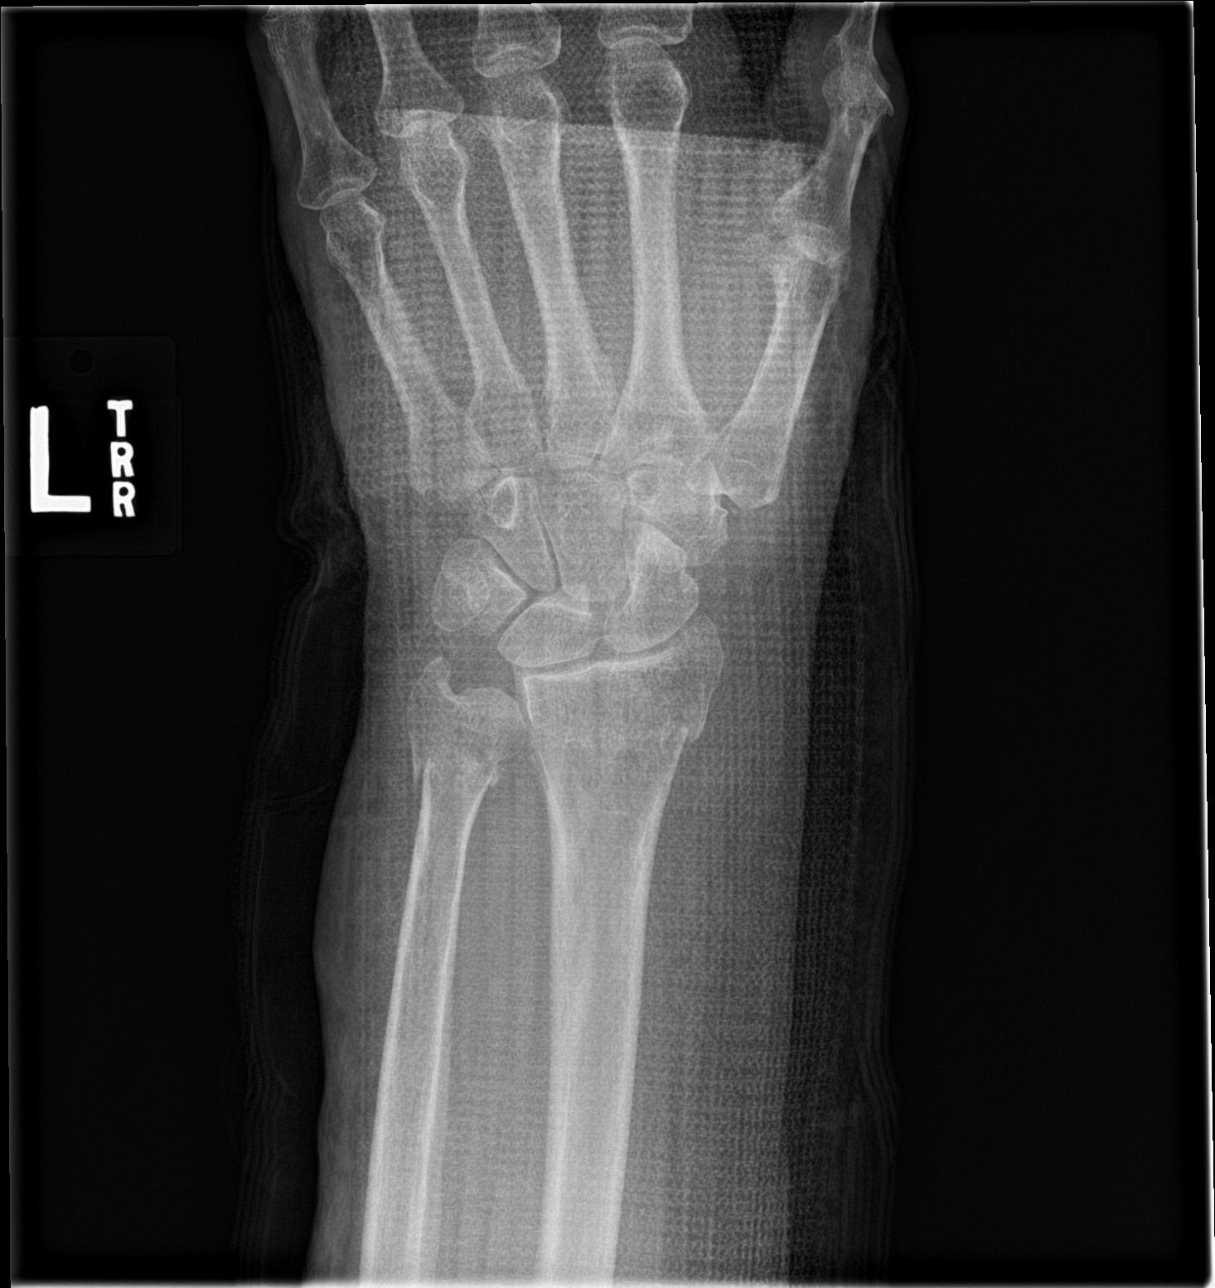

[wrist obl]
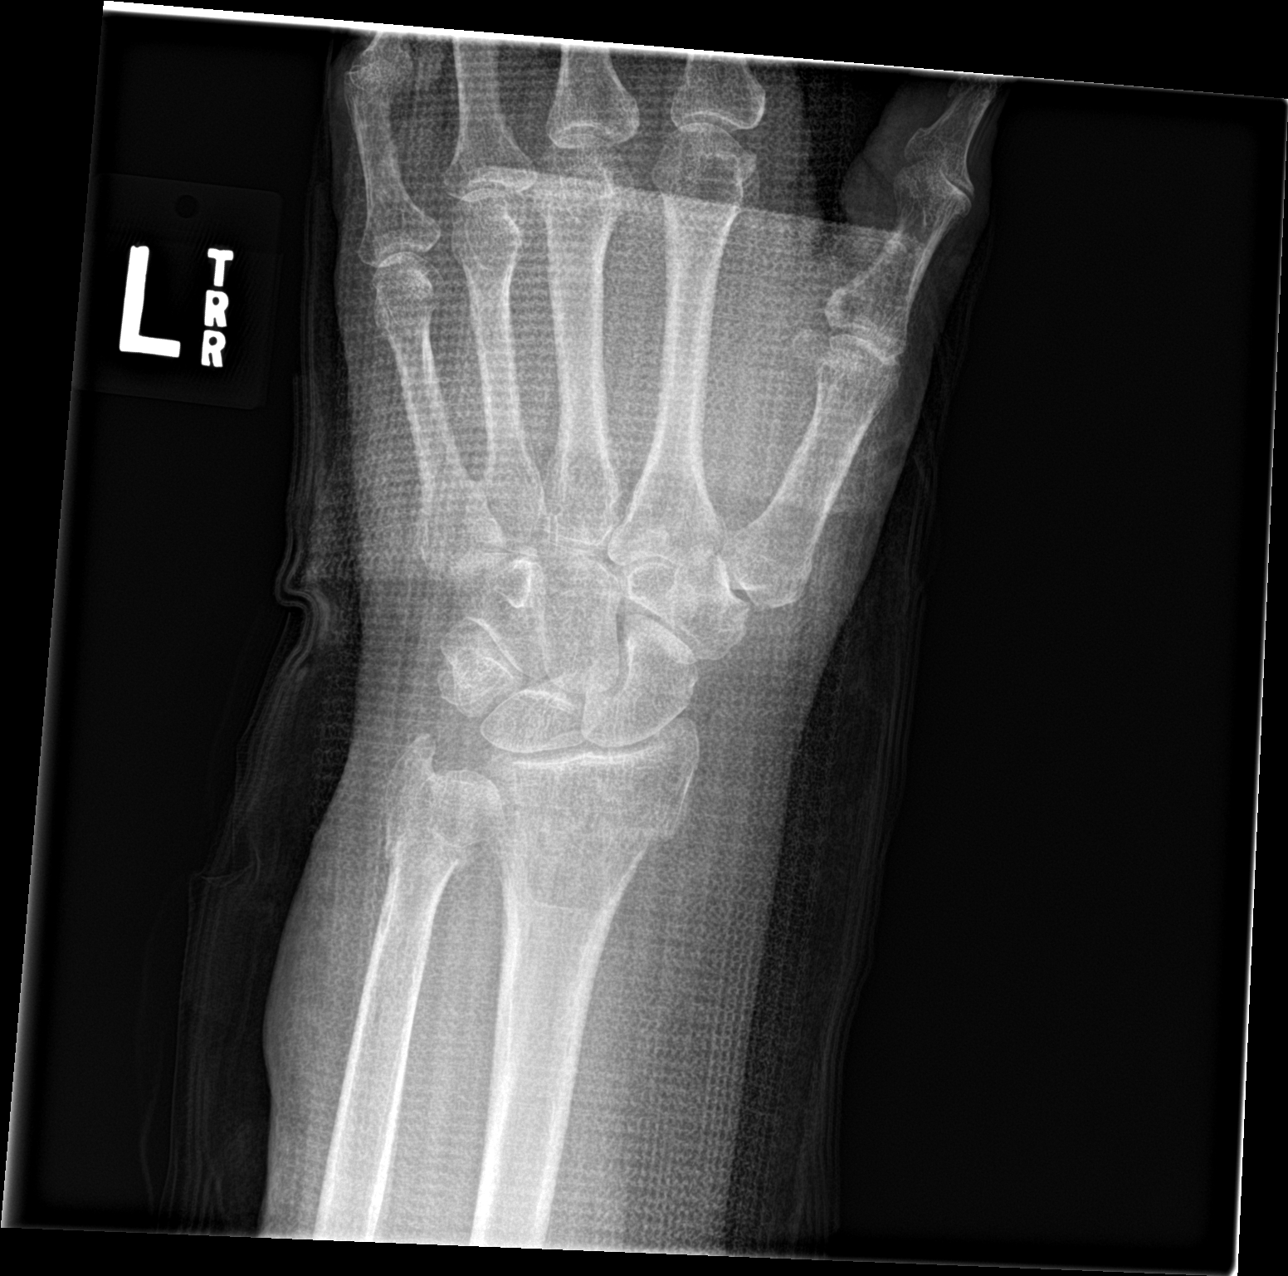

[wrist lat]
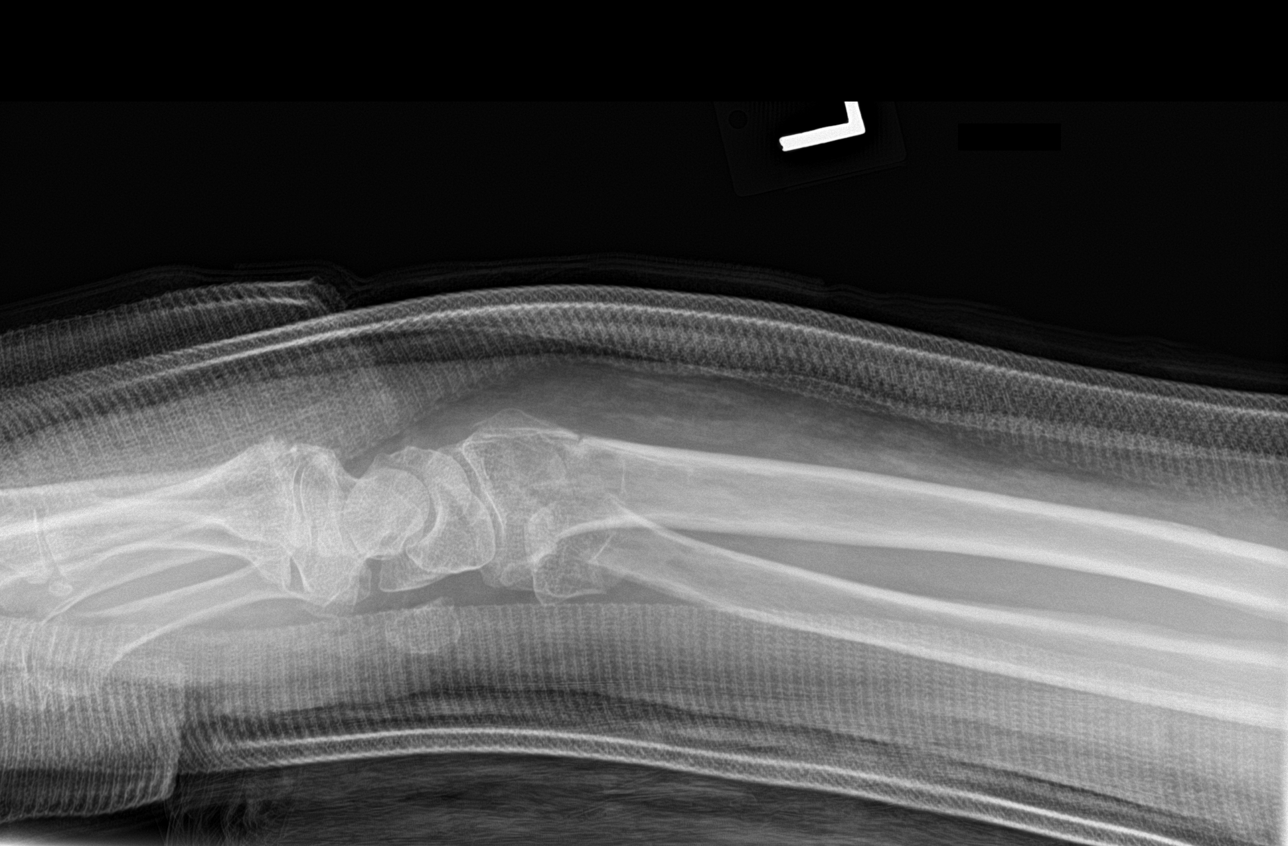

[wrist ap (2 of 2)]
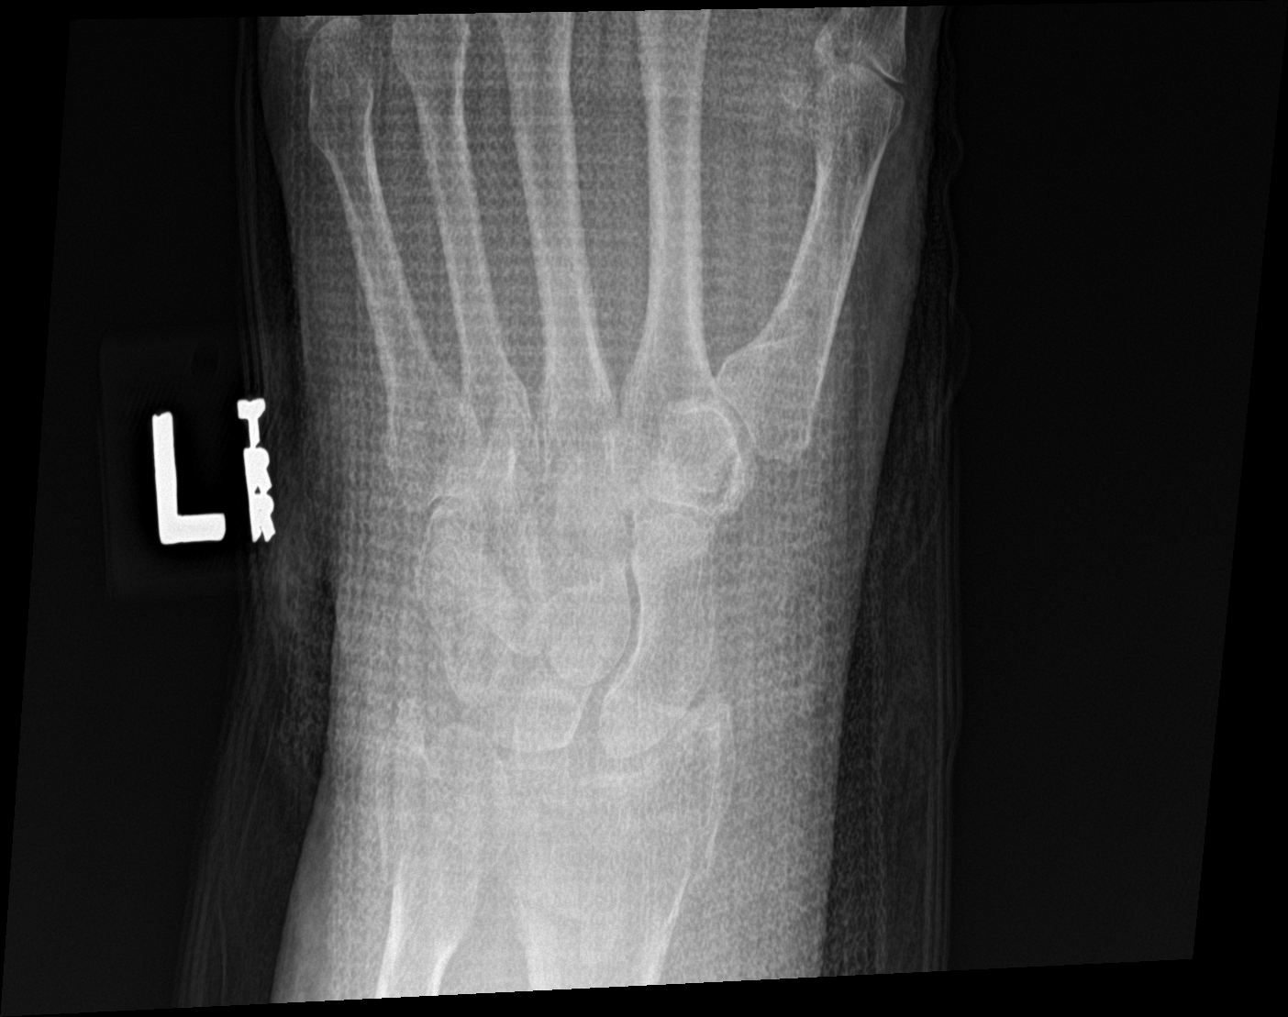

[4 of 4 positions shown; findings below may reference images not displayed]

FINDINGS: Fine bony detail is limited due to new overlying fiberglass cast.
Fractures of the distal radius and ulna are redemonstrated with
slight volar angulation of the distal fracture fragments now noted
post reduction. Carpal rows are intact.
IMPRESSION: Reduction of dorsally angulated distal radius and ulnar fractures
now slightly volar in appearance on the lateral view as seen through
fiberglass.

## 2019-09-08 DIAGNOSIS — H2513 Age-related nuclear cataract, bilateral: Secondary | ICD-10-CM | POA: Diagnosis not present

## 2019-09-25 ENCOUNTER — Ambulatory Visit (INDEPENDENT_AMBULATORY_CARE_PROVIDER_SITE_OTHER): Payer: Medicare Other | Admitting: Licensed Clinical Social Worker

## 2019-09-25 DIAGNOSIS — K589 Irritable bowel syndrome without diarrhea: Secondary | ICD-10-CM

## 2019-09-25 DIAGNOSIS — M810 Age-related osteoporosis without current pathological fracture: Secondary | ICD-10-CM

## 2019-09-25 DIAGNOSIS — F332 Major depressive disorder, recurrent severe without psychotic features: Secondary | ICD-10-CM | POA: Diagnosis not present

## 2019-09-25 DIAGNOSIS — E781 Pure hyperglyceridemia: Secondary | ICD-10-CM

## 2019-09-25 DIAGNOSIS — K219 Gastro-esophageal reflux disease without esophagitis: Secondary | ICD-10-CM

## 2019-09-25 NOTE — Chronic Care Management (AMB) (Signed)
Chronic Care Management    Clinical Social Work Follow Up Note  09/25/2019 Name: Marie Perry MRN: [id number] DOB: August 12, 1950  Marie Perry is a 69 y.o. year old female who is a primary care patient of Cannady, Barbaraann Faster, NP. The CCM team was consulted for assistance with Caregiver Stress.   Review of patient status, including review of consultants reports, other relevant assessments, and collaboration with appropriate care team members and the patient's provider was performed as part of comprehensive patient evaluation and provision of chronic care management services.    SDOH (Social Determinants of Health) assessments performed: Yes    Outpatient Encounter Medications as of 09/25/2019  Medication Sig  . busPIRone (BUSPAR) 10 MG tablet Take 1 tablet (10 mg total) by mouth 2 (two) times daily.  . Cholecalciferol (VITAMIN D PO) Take 2,000 Units by mouth daily.   . Cyanocobalamin (B-12 PO) Take 1,000 mcg by mouth daily.   . DULoxetine (CYMBALTA) 60 MG capsule Take 1 capsule (60 mg total) by mouth daily.  [name] omeprazole (PRILOSEC) 20 MG capsule Take 1 capsule (20 mg total) by mouth daily.   No facility-administered encounter medications on file as of 09/25/2019.     Goals Addressed    .  "I wouldn't mind having some extra help managing my depression and stress" (pt-stated)        Current Barriers:  [name] Knowledge Deficits related to long term strategies for management of depression and caregiver stress . Lacks caregiver support. 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   Social Work Clinical Goal(s):  [name] Over the next 90 days, patient will verbalize basic understanding of depression/stress process and self health management plan as evidenced by her participation in development of long term plan of care and institution of self health management strategies . Over the next 90 days, patient will be educated on healthy coping skills and will implement these appropriate self  care methods into her daily routine to combat depression/anxiety. . Over the next 90 days, patient will be educated on appropriate mental health resources that are available to her as well as socialization opportunities.   Interventions:  . Evaluation of current treatment plan related to depression/caregiver stress and patient's adherence to plan as established by provider. . Patient interviewed and appropriate assessments performed . Provided mental health counseling with regard to depression/caregiver stress. Patient reports that her recent medication dosage increase is helping with her anxiety but not with her depression. Patient shares that she is not experiencing as much crying spells as before which is a great relief to her as they were mentally draining for her to experience daily.  . Education provided on how to implement appropriate self-care coping tools to combat triggers and stress. Patient receptive to education and reports implementing appropriate self-care by reading and spending time outside.  . Discussed plans with patient for ongoing care management follow up and provided patient with direct contact information for care management team . Advised patient to contact LCSW for any ugrent social work needs . Assisted patient/caregiver with obtaining information about health plan benefits . Provided mental health counseling with regard to patient was emotional throughout phone call due to her daily difficulty with depression/anxiety and stress from being a caregiver. LCSW provided reflective listening and implemented appropriate interventions to help suppport patient and her emotional needs  . Patient reports that she is fully COVID vaccinated which has decreased some of her anxiety.  . Patient denies any SI/HI at  this time. Patient admits that she has not been exercising or eating the best. Patient reports her only walking is to the mailbox. Self-care education provided. [name] LCSW provided  mental health resource education and reminded patient that PCP sent behavioral health referral for therapy at last PCP visit. LCSW provided patient with Dr. Karleen Dolphin contact information as she believes this is the referral that her PCP sent. Patient is agreeable to contact Dr. Nicolasa Ducking to check on referral as well. Patient only prefers telehealth therapy due to fear of getting COVID.  [name] LCSW completed development of long term plan of care and institution of self health management strategies with patient . LCSW provided education on available socialization and senior opportunities within her nearby area. . Patient reports that her family had a meeting to discuss their concerns regarding patient's recent caregiver burnout symptoms. Family hired an Engineer, production to come into the home twice per week for 8 hours per day. Patient reports that this has decreased her stress and depression already.   [name] Self Care Activities:  . Performs ADL's independently . Calls provider office for new concerns or questions . Attends all scheduled provider appointments  Plan:  . Patient will call provider practice for new needs/concerns. [name] LCSW willfollow up with patient next month to provide ongoing mental health resource education and support  Please see past updates related to this goal by clicking on the "Past Updates" button in the selected goal          Follow Up Plan: SW will follow up with patient by phone over the next quarter  Eula Fried, [place], MSW, [email or web address] Phone: [phone]

## 2019-11-09 ENCOUNTER — Ambulatory Visit: Payer: Medicare Other | Admitting: Nurse Practitioner

## 2019-11-16 DIAGNOSIS — Z23 Encounter for immunization: Secondary | ICD-10-CM | POA: Diagnosis not present

## 2019-11-23 ENCOUNTER — Other Ambulatory Visit: Payer: Self-pay

## 2019-11-23 ENCOUNTER — Encounter: Payer: Self-pay | Admitting: Nurse Practitioner

## 2019-11-23 ENCOUNTER — Ambulatory Visit (INDEPENDENT_AMBULATORY_CARE_PROVIDER_SITE_OTHER): Payer: Medicare Other | Admitting: Nurse Practitioner

## 2019-11-23 DIAGNOSIS — F332 Major depressive disorder, recurrent severe without psychotic features: Secondary | ICD-10-CM

## 2019-11-23 MED ORDER — DULOXETINE HCL 60 MG PO CPEP: 60.0000 mg | ORAL_CAPSULE | Freq: Every day | ORAL | 4 refills | Status: DC

## 2019-11-23 MED ORDER — BUSPIRONE HCL 10 MG PO TABS: 10.0000 mg | ORAL_TABLET | Freq: Two times a day (BID) | ORAL | 4 refills | Status: DC

## 2019-11-23 NOTE — Progress Notes (Signed)
BP 116/72 (BP Location: Left Arm, Patient Position: Sitting, Cuff Size: Normal)   Pulse 68   Temp 98.3 F (36.8 C) (Oral)   Ht 5\' 5"  (1.651 m)   Wt 178 lb 3.2 oz (80.8 kg)   LMP  (LMP Unknown)   SpO2 98%   BMI 29.65 kg/m    Subjective:    Patient ID: Marie Perry, female    DOB: July 17, 1950, 69 y.o.   MRN: [id number]  HPI: Marie Perry is a 69 y.o. female  Chief Complaint  Patient presents with  . 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 Has ongoing issues with anxiety/depression due to caregiver strain -- has a person come in twice a week to help now. Current medications include Cymbalta 60 MG and Buspar10MG  BID (increased to this in May).Endorses this has been a difficult time due to not being able to get out on occasion due to concern for exposing her mother. Reports she has minimal socialization recently.States that increase in Buspar has helped, less tearful.Working with [facility] for therapy and caregiver strain. Mood status:stable Satisfied with current treatment?:yes Symptom severity:moderate Duration of current treatment :chronic Side effects:no Medication compliance:good compliance Psychotherapy/counseling:currently with the SW Previous psychiatric medications:Cymbalta and Buspar Depressed mood:yes Anxious mood:no Anhedonia:no Significant weight loss or gain:no Insomnia:at times has issues falling asleep Fatigue:no Feelings of worthlessness or guilt:no Impaired concentration/indecisiveness:yes Suicidal ideations:no Hopelessness:no Crying spells:at times, but reports these have improved with increase in Buspar Depression screen [facility] 2/9 11/23/2019 05/08/2019 03/05/2019 01/13/2019 10/10/2018  Decreased Interest 0 1 0 1 1  Down, Depressed, Hopeless 1 1 1  0 1  PHQ - 2 Score 1 2 1 1 2   Altered sleeping 1 1 - 1 2  Tired, decreased energy 1 1 - 3 3  Change in appetite 1 1 - 2 1  Feeling bad  or failure about yourself  1 1 - 1 1  Trouble concentrating 1 1 - 1 1  Moving slowly or fidgety/restless 0 0 - 0 0  Suicidal thoughts 0 0 - 0 0  PHQ-9 Score 6 7 - 9 10  Difficult doing work/chores Somewhat difficult Not difficult at all - Somewhat difficult Somewhat difficult  Some recent data might be hidden    Relevant past medical, surgical, family and social history reviewed and updated as indicated. Interim medical history since our last visit reviewed. Allergies and medications reviewed and updated.  Review of Systems  Constitutional: Negative for activity change, appetite change, diaphoresis, fatigue and fever.  Respiratory: Negative for cough, chest tightness and shortness of breath.   Cardiovascular: Negative for chest pain, palpitations and leg swelling.  Gastrointestinal: Negative.   Neurological: Negative.   Psychiatric/Behavioral: Negative for decreased concentration, self-injury, sleep disturbance and suicidal ideas.    Per HPI unless specifically indicated above     Objective:    BP 116/72 (BP Location: Left Arm, Patient Position: Sitting, Cuff Size: Normal)   Pulse 68   Temp 98.3 F (36.8 C) (Oral)   Ht 5\' 5"  (1.651 m)   Wt 178 lb 3.2 oz (80.8 kg)   LMP  (LMP Unknown)   SpO2 98%   BMI 29.65 kg/m   Wt Readings from Last 3 Encounters:  11/23/19 178 lb 3.2 oz (80.8 kg)  05/08/19 202 lb 9.6 oz (91.9 kg)  04/07/18 195 lb (88.5 kg)    Physical Exam Vitals and nursing note reviewed.  Constitutional:      Appearance: She is  well-developed.  HENT:     Head: Normocephalic.  Eyes:     General:        Right eye: No discharge.        Left eye: No discharge.     Conjunctiva/sclera: Conjunctivae normal.     Pupils: Pupils are equal, round, and reactive to light.  Neck:     Thyroid: No thyromegaly.     Vascular: No carotid bruit or JVD.  Cardiovascular:     Rate and Rhythm: Normal rate and regular rhythm.     Heart sounds: Normal heart sounds.  Pulmonary:      Effort: Pulmonary effort is normal.     Breath sounds: Normal breath sounds.  Abdominal:     General: Bowel sounds are normal.     Palpations: Abdomen is soft.  Musculoskeletal:     Cervical back: Normal range of motion and neck supple.  Lymphadenopathy:     Cervical: No cervical adenopathy.  Skin:    General: Skin is warm and dry.  Neurological:     Mental Status: She is alert and oriented to person, place, and time.  Psychiatric:        Attention and Perception: Attention normal.        Mood and Affect: Mood normal.        Speech: Speech normal.        Behavior: Behavior normal.        Thought Content: Thought content normal.     Results for orders placed or performed in visit on 05/08/19  CBC with Differential/Platelet  Result Value Ref Range   WBC 7.8 3.4 - 10.8 x10E3/uL   RBC 5.14 3.77 - 5.28 x10E6/uL   Hemoglobin 15.3 11.1 - 15.9 g/dL   Hematocrit 46.3 34.0 - 46.6 %   MCV 90 79 - 97 fL   MCH 29.8 26.6 - 33.0 pg   MCHC 33.0 31 - 35 g/dL   RDW 13.0 11.7 - 15.4 %   Platelets 248 150 - 450 x10E3/uL   Neutrophils 63 Not Estab. %   Lymphs 29 Not Estab. %   Monocytes 6 Not Estab. %   Eos 2 Not Estab. %   Basos 0 Not Estab. %   Neutrophils Absolute 4.8 1 - 7 x10E3/uL   Lymphocytes Absolute 2.3 0 - 3 x10E3/uL   Monocytes Absolute 0.5 0 - 0 x10E3/uL   EOS (ABSOLUTE) 0.2 0.0 - 0.4 x10E3/uL   Basophils Absolute 0.0 0 - 0 x10E3/uL   Immature Granulocytes 0 Not Estab. %   Immature Grans (Abs) 0.0 0.0 - 0.1 x10E3/uL  Comprehensive metabolic panel  Result Value Ref Range   Glucose 95 65 - 99 mg/dL   BUN 20 8 - 27 mg/dL   Creatinine, Ser 0.96 0.57 - 1.00 mg/dL   GFR calc non Af Amer 61 >59 mL/min/1.73   GFR calc Af Amer 70 >59 mL/min/1.73   BUN/Creatinine Ratio 21 12 - 28   Sodium 141 134 - 144 mmol/L   Potassium 4.1 3.5 - 5.2 mmol/L   Chloride 104 96 - 106 mmol/L   CO2 23 20 - 29 mmol/L   Calcium 9.9 8.7 - 10.3 mg/dL   Total Protein 7.1 6.0 - 8.5 g/dL   Albumin 4.6  3.8 - 4.8 g/dL   Globulin, Total 2.5 1.5 - 4.5 g/dL   Albumin/Globulin Ratio 1.8 1.2 - 2.2   Bilirubin Total 0.6 0.0 - 1.2 mg/dL   Alkaline Phosphatase 92 39 - 117 IU/L  AST 23 0 - 40 IU/L   ALT 21 0 - 32 IU/L  Lipid Panel w/o Chol/HDL Ratio  Result Value Ref Range   Cholesterol, Total 217 (H) 100 - 199 mg/dL   Triglycerides 246 (H) 0 - 149 mg/dL   HDL 44 >39 mg/dL   VLDL Cholesterol Cal 44 (H) 5 - 40 mg/dL   LDL Chol Calc (NIH) 129 (H) 0 - 99 mg/dL  TSH  Result Value Ref Range   TSH 3.410 0.450 - 4.500 uIU/mL  VITAMIN D 25 Hydroxy (Vit-D Deficiency, Fractures)  Result Value Ref Range   Vit D, 25-Hydroxy 32.2 30.0 - 100.0 ng/mL  Vitamin B12  Result Value Ref Range   Vitamin B-12 1,115 232 - 1,245 pg/mL      Assessment & Plan:   Problem List Items Addressed This Visit      Other   Recurrent major depression-severe (HCC)    Chronic, stable.  Denies SI/HI.  Will continue current medication regimen at this time and adjust as needed.   May consider switch from Cymbalta to Sertraline in future if exacerbation.  Return in 6 months or sooner if worsening.      Relevant Medications   DULoxetine (CYMBALTA) 60 MG capsule   busPIRone (BUSPAR) 10 MG tablet       Follow up plan: Return in about 6 months (around 05/23/2020) for Annual physical.

## 2019-11-23 NOTE — Patient Instructions (Addendum)
[facility]  Address: [address] [place], [place], [place] [address]  Phone: [phone]  Living With Depression Everyone experiences occasional disappointment, sadness, and loss in their lives. When you are feeling down, blue, or sad for at least 2 weeks in a row, it may mean that you have depression. Depression can affect your thoughts and feelings, relationships, daily activities, and physical health. It is caused by changes in the way your brain functions. If you receive a diagnosis of depression, your health care provider will tell you which type of depression you have and what treatment options are available to you. If you are living with depression, there are ways to help you recover from it and also ways to prevent it from coming back. How to cope with lifestyle changes Coping with stress     Stress is your body's reaction to life changes and events, both good and bad. Stressful situations may include:  Getting married.  The death of a spouse.  Losing a job.  Retiring.  Having a baby. Stress can last just a few hours or it can be ongoing. Stress can play a major role in depression, so it is important to learn both how to cope with stress and how to think about it differently. Talk with your health care provider or a counselor if you would like to learn more about stress reduction. He or she may suggest some stress reduction techniques, such as:  Music therapy. This can include creating music or listening to music. Choose music that you enjoy and that inspires you.  Mindfulness-based meditation. This kind of meditation can be done while sitting or walking. It involves being aware of your normal breaths, rather than trying to control your breathing.  Centering prayer. This is a kind of meditation that involves focusing on a spiritual word or phrase. Choose a word, phrase, or sacred image that is meaningful to you and that brings you  peace.  Deep breathing. To do this, expand your stomach and inhale slowly through your nose. Hold your breath for 3-5 seconds, then exhale slowly, allowing your stomach muscles to relax.  Muscle relaxation. This involves intentionally tensing muscles then relaxing them. Choose a stress reduction technique that fits your lifestyle and personality. Stress reduction techniques take time and practice to develop. Set aside 5-15 minutes a day to do them. Therapists can offer training in these techniques. The training may be covered by some insurance plans. Other things you can do to manage stress include:  Keeping a stress diary. This can help you learn what triggers your stress and ways to control your response.  Understanding what your limits are and saying no to requests or events that lead to a schedule that is too full.  Thinking about how you respond to certain situations. You may not be able to control everything, but you can control how you react.  Adding humor to your life by watching funny films or TV shows.  Making time for activities that help you relax and not feeling guilty about spending your time this way.  Medicines Your health care provider may suggest certain medicines if he or she feels that they will help improve your condition. Avoid using alcohol and other substances that may prevent your medicines from working properly (may interact). It is also important to:  Talk with your pharmacist or health care provider about all the medicines that you take, their possible side effects, and what medicines are safe to  take together.  Make it your goal to take part in all treatment decisions (shared decision-making). This includes giving input on the side effects of medicines. It is best if shared decision-making with your health care provider is part of your total treatment plan. If your health care provider prescribes a medicine, you may not notice the full benefits of it for 4-8 weeks.  Most people who are treated for depression need to be on medicine for at least 6-12 months after they feel better. If you are taking medicines as part of your treatment, do not stop taking medicines without first talking to your health care provider. You may need to have the medicine slowly decreased (tapered) over time to decrease the risk of harmful side effects. Relationships Your health care provider may suggest family therapy along with individual therapy and drug therapy. While there may not be family problems that are causing you to feel depressed, it is still important to make sure your family learns as much as they can about your mental health. Having your family's support can help make your treatment successful. How to recognize changes in your condition Everyone has a different response to treatment for depression. Recovery from major depression happens when you have not had signs of major depression for two months. This may mean that you will start to:  Have more interest in doing activities.  Feel less hopeless than you did 2 months ago.  Have more energy.  Overeat less often, or have better or improving appetite.  Have better concentration. Your health care provider will work with you to decide the next steps in your recovery. It is also important to recognize when your condition is getting worse. Watch for these signs:  Having fatigue or low energy.  Eating too much or too little.  Sleeping too much or too little.  Feeling restless, agitated, or hopeless.  Having trouble concentrating or making decisions.  Having unexplained physical complaints.  Feeling irritable, angry, or aggressive. Get help as soon as you or your family members notice these symptoms coming back. How to get support and help from others How to talk with friends and family members about your condition  Talking to friends and family members about your condition can provide you with one way to get  support and guidance. Reach out to trusted friends or family members, explain your symptoms to them, and let them know that you are working with a health care provider to treat your depression. Financial resources Not all insurance plans cover mental health care, so it is important to check with your insurance carrier. If paying for co-pays or counseling services is a problem, search for a local or county mental health care center. They may be able to offer public mental health care services at low or no cost when you are not able to see a private health care provider. If you are taking medicine for depression, you may be able to get the generic form, which may be less expensive. Some makers of prescription medicines also offer help to patients who cannot afford the medicines they need. Follow these instructions at home:   Get the right amount and quality of sleep.  Cut down on using caffeine, tobacco, alcohol, and other potentially harmful substances.  Try to exercise, such as walking or lifting small weights.  Take over-the-counter and prescription medicines only as told by your health care provider.  Eat a healthy diet that includes plenty of vegetables, fruits, whole grains, low-fat dairy  products, and lean protein. Do not eat a lot of foods that are high in solid fats, added sugars, or salt.  Keep all follow-up visits as told by your health care provider. This is important. Contact a health care provider if:  You stop taking your antidepressant medicines, and you have any of these symptoms: ? Nausea. ? Headache. ? Feeling lightheaded. ? Chills and body aches. ? Not being able to sleep (insomnia).  You or your friends and family think your depression is getting worse. Get help right away if:  You have thoughts of hurting yourself or others. If you ever feel like you may hurt yourself or others, or have thoughts about taking your own life, get help right away. You can go to your  nearest emergency department or call:  Your local emergency services (911 in the [place]  A suicide crisis helpline, such as the [facility] at 234-086-2976. This is open 24-hours a day. Summary  If you are living with depression, there are ways to help you recover from it and also ways to prevent it from coming back.  Work with your health care team to create a management plan that includes counseling, stress management techniques, and healthy lifestyle habits. This information is not intended to replace advice given to you by your health care provider. Make sure you discuss any questions you have with your health care provider. Document Revised: 05/23/2018 Document Reviewed: 01/02/2016 Elsevier Patient Education  [employer].

## 2019-11-23 NOTE — Assessment & Plan Note (Signed)
Chronic, stable.  Denies SI/HI.  Will continue current medication regimen at this time and adjust as needed.   May consider switch from Cymbalta to Sertraline in future if exacerbation.  Return in 6 months or sooner if worsening.

## 2019-11-25 ENCOUNTER — Other Ambulatory Visit: Payer: Self-pay | Admitting: Nurse Practitioner

## 2019-11-25 DIAGNOSIS — Z1231 Encounter for screening mammogram for malignant neoplasm of breast: Secondary | ICD-10-CM

## 2019-11-26 DIAGNOSIS — Z23 Encounter for immunization: Secondary | ICD-10-CM | POA: Diagnosis not present

## 2019-11-30 ENCOUNTER — Telehealth: Payer: Self-pay

## 2019-12-11 ENCOUNTER — Telehealth: Payer: Self-pay

## 2019-12-24 ENCOUNTER — Ambulatory Visit
Admission: RE | Admit: 2019-12-24 | Discharge: 2019-12-24 | Disposition: A | Discharge status: Home / Self Care | Payer: Medicare Other | Source: Ambulatory Visit | Attending: Nurse Practitioner | Admitting: Nurse Practitioner

## 2019-12-24 ENCOUNTER — Other Ambulatory Visit: Payer: Self-pay

## 2019-12-24 DIAGNOSIS — Z1231 Encounter for screening mammogram for malignant neoplasm of breast: Secondary | ICD-10-CM | POA: Diagnosis not present

## 2020-01-01 ENCOUNTER — Telehealth: Payer: Self-pay

## 2020-01-18 ENCOUNTER — Telehealth: Payer: Self-pay

## 2020-02-04 ENCOUNTER — Other Ambulatory Visit: Payer: Self-pay | Admitting: Nurse Practitioner

## 2020-02-08 ENCOUNTER — Other Ambulatory Visit: Payer: Self-pay | Admitting: Nurse Practitioner

## 2020-03-04 ENCOUNTER — Ambulatory Visit: Payer: Medicare Other | Admitting: Licensed Clinical Social Worker

## 2020-03-04 DIAGNOSIS — K219 Gastro-esophageal reflux disease without esophagitis: Secondary | ICD-10-CM

## 2020-03-04 DIAGNOSIS — M810 Age-related osteoporosis without current pathological fracture: Secondary | ICD-10-CM

## 2020-03-04 DIAGNOSIS — F332 Major depressive disorder, recurrent severe without psychotic features: Secondary | ICD-10-CM

## 2020-03-04 DIAGNOSIS — E781 Pure hyperglyceridemia: Secondary | ICD-10-CM

## 2020-03-04 NOTE — Chronic Care Management (AMB) (Signed)
Chronic Care Management    Clinical Social Work Note  03/04/2020 Name: Bernadette Gores MRN: [id number] DOB: 1950/08/20  Marie Perry is a 70 y.o. year old female who is a primary care patient of Cannady, Barbaraann Faster, NP. The CCM team was consulted to assist the patient with chronic disease management and/or care coordination needs related to: [place] representative.   Engaged with patient by telephone for follow up visit in response to provider referral for social work chronic care management and care coordination services.   Consent to Services:  The patient was given the following information about Chronic Care Management services today, agreed to services, and gave verbal consent: 1. CCM service includes personalized support from designated clinical staff supervised by the primary care provider, including individualized plan of care and coordination with other care providers 2. 24/7 contact phone numbers for assistance for urgent and routine care needs. 3. Service will only be billed when office clinical staff spend 20 minutes or more in a month to coordinate care. 4. Only one practitioner may furnish and bill the service in a calendar month. 5.The patient may stop CCM services at any time (effective at the end of the month) by phone call to the office staff. 6. The patient will be responsible for cost sharing (co-pay) of up to 20% of the service fee (after annual deductible is met). Patient agreed to services and consent obtained.  Patient agreed to services and consent obtained.   Assessment: Review of patient past medical history, allergies, medications, and health status, including review of relevant consultants reports was performed today as part of a comprehensive evaluation and provision of chronic care management and care coordination services.     SDOH (Social Determinants of Health) assessments and interventions performed:    Advanced Directives Status: See Care Plan  for related entries.  CCM Care Plan  No Known Allergies  Outpatient Encounter Medications as of 03/04/2020  Medication Sig  . busPIRone (BUSPAR) 10 MG tablet Take 1 tablet by mouth twice daily  . Cholecalciferol (VITAMIN D PO) Take 2,000 Units by mouth daily.   . Cyanocobalamin (B-12 PO) Take 1,000 mcg by mouth daily.   . DULoxetine (CYMBALTA) 60 MG capsule Take 1 capsule (60 mg total) by mouth daily.  [name] omeprazole (PRILOSEC) 20 MG capsule Take 1 capsule by mouth once daily   No facility-administered encounter medications on file as of 03/04/2020.    Patient Active Problem List   Diagnosis Date Noted  . Vitamin B12 deficiency 02/27/2018  . Osteoporosis 04/15/2017  . Advanced care planning/counseling discussion 02/22/2017  . Hypertriglyceridemia 02/22/2017  . Hx of colonic polyps 04/13/2016  . Obesity (BMI 30.0-34.9) 10/07/2015  . IBS (irritable bowel syndrome) 04/29/2015  . Allergic rhinitis 04/29/2015  . GERD (gastroesophageal reflux disease) 04/29/2015  . Recurrent major depression-severe ([place]) 04/29/2015  . Vitamin D deficiency 04/29/2015  . Sleep apnea 04/29/2015    Conditions to be addressed/monitored: Anxiety and Depression; Mental Health Concerns   Care Plan : General Social Work (Adult)  Updates made by Greg Cutter, LCSW since 03/04/2020 12:00 AM    Problem: Response to Treatment (Depression)     Goal: Response to Treatment Maximized   Start Date: 03/04/2020  Expected End Date: 05/02/2020  This Visit's Progress: On track  Priority: Medium  Note:   Evidence-based guidance:   Engage patient in conversation about the perceived benefits of mental health treatment and quality of therapeutic alliance with his/her mental health professional.  Assess for barriers to attending appointments, such as transportation, financial, sense of slow or little improvement and forgetfulness.   Consider patient resistance to treatment based on stigma related to mental health  diagnosis.   Maintain a documented system of ongoing contacts with patient during the first 6 to 12 months of treatment, as missed appointments and disengagement may signal deteriorating condition.   Provide anticipatory guidance about the risk of increased symptoms and potential psychiatric hospitalization for those who have a pattern of nonattendance at mental health appointments.   Re-screen for depressive symptoms at mutually identified intervals.   Notes:   Current Barriers:  [name] Knowledge Deficits related to long term strategies for management of depression and caregiver stress . Lacks caregiver support. 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   Social Work Clinical Goal(s):  [name] Over the next 90 days, patient will verbalize basic understanding of depression/stress process and self health management plan as evidenced by her participation in development of long term plan of care and institution of self health management strategies . Over the next 90 days, patient will be educated on healthy coping skills and will implement these appropriate self care methods into her daily routine to combat depression/anxiety. . Over the next 90 days, patient will be educated on appropriate mental health resources that are available to her as well as socialization opportunities.   Interventions:  . Evaluation of current treatment plan related to depression/caregiver stress and patient's adherence to plan as established by provider. . New Concern 03/04/20-Patient is having increased depression since her mother's recent diagnosis of moderate Alzheimer's disease. Her mother had a CT scan completed. She reports that her mother will go back to the neurologist next month. Emotional support and coping skill education provided to patient for this new stressor.  . Patient interviewed and appropriate assessments performed . Provided mental health counseling with regard to depression/caregiver stress. Patient  reports that her recent medication dosage increase is helping with her anxiety but not with her depression. Patient shares that she is not experiencing as much crying spells as before which is a great relief to her as they were mentally draining for her to experience daily.  . Education provided on how to implement appropriate self-care coping tools to combat triggers and stress. Patient receptive to education and reports implementing appropriate self-care by reading and spending time outside.  . Discussed plans with patient for ongoing care management follow up and provided patient with direct contact information for care management team . Advised patient to contact LCSW for any ugrent social work needs . Assisted patient/caregiver with obtaining information about health plan benefits . Provided mental health counseling with regard to patient was emotional throughout phone call due to her daily difficulty with depression/anxiety and stress from being a caregiver. LCSW provided reflective listening and implemented appropriate interventions to help suppport patient and her emotional needs  . Patient reports that she is fully COVID vaccinated which has decreased some of her anxiety.  . Patient denies any SI/HI at this time. Patient admits that she has not been exercising or eating the best. Patient reports her only walking is to the mailbox. Self-care education provided. [name] LCSW provided mental health resource education and reminded patient that PCP sent behavioral health referral for therapy at last PCP visit. LCSW provided patient with Dr. Karleen Dolphin contact information as she believes this is the referral that her PCP sent. Patient is agreeable to contact Dr. Nicolasa Ducking to check on referral  as well. Patient only prefers telehealth therapy due to fear of getting COVID.  [name] LCSW completed development of long term plan of care and institution of self health management strategies with patient . LCSW provided  education on available socialization and senior opportunities within her nearby area. . Patient reports that her family had a meeting to discuss their concerns regarding patient's recent caregiver burnout symptoms. Family hired an Engineer, production to come into the home twice per week for 8 hours per day. Patient reports that this has decreased her stress and depression already.   [name] Self Care Activities:  . Performs ADL's independently . Calls provider office for new concerns or questions . Attends all scheduled provider appointments  Plan:  . Patient will call provider practice for new needs/concerns. [name] LCSW willfollow up with patient next month to provide ongoing mental health resource education and support  Please see past updates related to this goal by clicking on the "Past Updates" button in the selected goal    Task: Facilitate Engagement in Mental Health Services   Note:   Care Management Activities:    - risk of unmanaged depression discussed    Notes:       Follow Up Plan: SW will follow up with patient by phone over the next quarter      Eula Fried, BSW, MSW, [email or web address] Phone: [phone]

## 2020-03-07 ENCOUNTER — Ambulatory Visit (INDEPENDENT_AMBULATORY_CARE_PROVIDER_SITE_OTHER): Payer: Medicare Other

## 2020-03-07 VITALS — Ht 66.5 in | Wt 173.0 lb

## 2020-03-07 DIAGNOSIS — Z0000 Encounter for general adult medical examination without abnormal findings: Secondary | ICD-10-CM

## 2020-03-07 NOTE — Progress Notes (Signed)
I connected with Marie Perry today by telephone and verified that I am speaking with the correct person using two identifiers. Location patient: home Location provider: work Persons participating in the virtual visit: Marie " Romie Minus " Kenton Kingfisher LPN.   I discussed the limitations, risks, security and privacy concerns of performing an evaluation and management service by telephone and the availability of in person appointments. I also discussed with the patient that there may be a patient responsible charge related to this service. The patient expressed understanding and verbally consented to this telephonic visit.    Interactive audio and video telecommunications were attempted between this provider and patient, however failed, due to patient having technical difficulties OR patient did not have access to video capability.  We continued and completed visit with audio only.     Vital signs may be patient reported or missing.  Subjective:   Marie Perry is a 70 y.o. female who presents for Medicare Annual (Subsequent) preventive examination.  Review of Systems     Cardiac Risk Factors include: advanced age (>65men, >36 women);sedentary lifestyle     Objective:    Today's Vitals   03/07/20 1028  Weight: 173 lb (78.5 kg)  Height: 5' 6.5" (1.689 m)   Body mass index is 27.5 kg/m.  Advanced Directives 03/07/2020 02/27/2018 04/15/2017 02/22/2017 09/17/2016  Does Patient Have a Medical Advance Directive? Yes Yes No No No  Type of Paramedic of [place];Living will [place];Living will - - -  Copy of [place] in Chart? Yes - validated most recent copy scanned in chart (See row information) Yes - validated most recent copy scanned in chart (See row information) - - -  Would patient like information on creating a medical advance directive? - - - Yes (MAU/Ambulatory/Procedural Areas - Information given) -     Current Medications (verified) Outpatient Encounter Medications as of 03/07/2020  Medication Sig  . busPIRone (BUSPAR) 10 MG tablet Take 1 tablet by mouth twice daily  . Cholecalciferol (VITAMIN D PO) Take 2,000 Units by mouth daily.   . Cyanocobalamin (B-12 PO) Take 1,000 mcg by mouth daily.   . DULoxetine (CYMBALTA) 60 MG capsule Take 1 capsule (60 mg total) by mouth daily.  [name] omeprazole (PRILOSEC) 20 MG capsule Take 1 capsule by mouth once daily   No facility-administered encounter medications on file as of 03/07/2020.    Allergies (verified) Patient has no known allergies.   History: Past Medical History:  Diagnosis Date  . Allergy   . Anxiety   . Arthritis   . Cataract 02/2016   Yearly eye exam w/Dr. Ellin Mayhew  . 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 Depression   . GERD (gastroesophageal reflux disease)   . Hypertriglyceridemia   . IBS (irritable bowel syndrome)   . IFG (impaired fasting glucose)   . Osteopenia   . Sleep apnea   . Vitamin D deficiency    Past Surgical History:  Procedure Laterality Date  . bright's surgery    . COLONOSCOPY N/A 09/17/2016   Procedure: COLONOSCOPY;  Surgeon: Lollie Sails, MD;  Location: [facility] ENDOSCOPY;  Service: Endoscopy;  Laterality: N/A;  . COLONOSCOPY WITH PROPOFOL N/A 04/15/2017   Procedure: COLONOSCOPY WITH PROPOFOL;  Surgeon: Lollie Sails, MD;  Location[facility] END[facility]COPY;  Service: Endoscopy;  Laterality: N/A;  . SKIN TAG REMOVAL     rectal   Family History  Problem Relation  Age of Onset  . Mental illness Mother   . Depression Mother   . Anxiety disorder Mother   . Hearing loss Mother   . Miscarriages / [place] Mother   . Vision loss Mother   . Stroke Father   . Heart disease Father   . Hearing loss Father   . Vision loss Father   . Cancer Maternal Grandmother        ovarian  . Anemia Maternal Grandmother   . Arthritis Maternal Grandmother   . Lung disease Maternal Grandfather    . Birth defects Maternal Grandfather   . Asthma Maternal Grandfather   . Depression Sister   . Fibromyalgia Brother   . GER disease Brother   . Heart disease Paternal Grandmother   . Birth defects Paternal Grandfather   . Depression Brother   . Asthma Paternal Uncle   . Birth defects Sister   . Early death Sister   . Cancer Maternal Uncle   . Hearing loss Maternal Uncle   . Kidney disease Maternal Uncle   . Depression Brother   . Early death Paternal Uncle   . Hearing loss Maternal Aunt   . Vision loss Maternal Aunt   . Varicose Veins Maternal Aunt   . Heart disease Paternal Uncle   . Stroke Paternal Uncle   . Breast cancer Neg Hx    Social History   Socioeconomic History  . Marital status: Single    Spouse name: Not on file  . Number of children: 0  . Years of education: Not on file  . Highest education level: Associate degree: academic program  Occupational History  . Occupation: retired    Comment: works part time  Tobacco Use  . Smoking status: Never Smoker  . Smokeless tobacco: Never Used  Vaping Use  . Vaping Use: Never used  Substance and Sexual Activity  . Alcohol use: Yes    Alcohol/week: 0.0 standard drinks    Comment: rarely indulge  . Drug use: Never  . Sexual activity: Never  Other Topics Concern  . Not on file  Social History Narrative  . Not on file   Social Determinants of Health   Financial Resource Strain: Low Risk   . Difficulty of Paying Living Expenses: Not hard at all  Food Insecurity: No Food Insecurity  . Worried About Charity fundraiser in the Last Year: Never true  . Ran Out of Food in the Last Year: Never true  Transportation Needs: No Transportation Needs  . Lack of Transportation (Medical): No  . Lack of Transportation (Non-Medical): No  Physical Activity: Inactive  . Days of Exercise per Week: 0 days  . Minutes of Exercise per Session: 0 min  Stress: No Stress Concern Present  . Feeling of Stress : Not at all  Social  Connections: Not on file    Tobacco Counseling Counseling given: Not Answered   Clinical Intake:  Pre-visit preparation completed: Yes  Pain : No/denies pain     Nutritional Status: BMI 25 -29 Overweight Nutritional Risks: None Diabetes: No  How often do you need to have someone help you when you read instructions, pamphlets, or other written materials from your doctor or pharmacy?: 1 - Never What is the last grade level you completed in school?: college  Diabetic? no  Interpreter Needed?: No  Information entered by :: NAllen LPN   Activities of Daily Living In your present state of health, do you have any difficulty performing the following activities:  03/07/2020 05/08/2019  Hearing? N N  Vision? Y N  Comment a little, has cataracts -  Difficulty concentrating or making decisions? Tempie Donning  Comment distracted -  Walking or climbing stairs? N N  Dressing or bathing? N N  Doing errands, shopping? N N  Preparing Food and eating ? N -  Using the Toilet? N -  In the past six months, have you accidently leaked urine? N -  Do you have problems with loss of bowel control? N -  Managing your Medications? N -  Managing your Finances? N -  Housekeeping or managing your Housekeeping? N -  Some recent data might be hidden    Patient Care Team: Venita Lick, NP as PCP - General (Nurse Practitioner) Clerance Lav, RN as Case Manager Greg Cutter, LCSW as Social Worker (Licensed Clinical Social Worker)  Indicate any recent [place] you may have received from other than [facility] providers in the past year (date may be approximate).     Assessment:   This is a routine wellness examination for Marie Perry.  Hearing/Vision screen  Hearing Screening   125Hz  250Hz  500Hz  1000Hz  2000Hz  3000Hz  4000Hz  6000Hz  8000Hz   Right ear:           Left ear:           Vision Screening Comments: Regular eye exams, [facility]  Dietary issues and exercise activities  discussed: Current Exercise Habits: The patient does not participate in regular exercise at present  Goals    .  "I wouldn't mind having some extra help managing my depression and stress" (pt-stated)      Current Barriers:  [name] Knowledge Deficits related to long term strategies for management of depression and caregiver stress . Lacks caregiver support. 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   Social Work Clinical Goal(s):  [name] Over the next 90 days, patient will verbalize basic understanding of depression/stress process and self health management plan as evidenced by her participation in development of long term plan of care and institution of self health management strategies . Over the next 90 days, patient will be educated on healthy coping skills and will implement these appropriate self care methods into her daily routine to combat depression/anxiety. . Over the next 90 days, patient will be educated on appropriate mental health resources that are available to her as well as socialization opportunities.   Interventions:  . Evaluation of current treatment plan related to depression/caregiver stress and patient's adherence to plan as established by provider. . Patient interviewed and appropriate assessments performed . Provided mental health counseling with regard to depression/caregiver stress. Patient reports that her recent medication dosage increase is helping with her anxiety but not with her depression. Patient shares that she is not experiencing as much crying spells as before which is a great relief to her as they were mentally draining for her to experience daily.  . Education provided on how to implement appropriate self-care coping tools to combat triggers and stress. Patient receptive to education and reports implementing appropriate self-care by reading and spending time outside.  . Discussed plans with patient for ongoing care management follow up and provided patient  with direct contact information for care management team . Advised patient to contact LCSW for any ugrent social work needs . Assisted patient/caregiver with obtaining information about health plan benefits . Provided mental health counseling with regard to patient was emotional throughout phone call due to her daily difficulty  with depression/anxiety and stress from being a caregiver. LCSW provided reflective listening and implemented appropriate interventions to help suppport patient and her emotional needs  . Patient reports that she is fully COVID vaccinated which has decreased some of her anxiety.  . Patient denies any SI/HI at this time. Patient admits that she has not been exercising or eating the best. Patient reports her only walking is to the mailbox. Self-care education provided. [name] LCSW provided mental health resource education and reminded patient that PCP sent behavioral health referral for therapy at last PCP visit. LCSW provided patient with Dr. Karleen Dolphin contact information as she believes this is the referral that her PCP sent. Patient is agreeable to contact Dr. Nicolasa Ducking to check on referral as well. Patient only prefers telehealth therapy due to fear of getting COVID.  [name] LCSW completed development of long term plan of care and institution of self health management strategies with patient . LCSW provided education on available socialization and senior opportunities within her nearby area. . Patient reports that her family had a meeting to discuss their concerns regarding patient's recent caregiver burnout symptoms. Family hired an Engineer, production to come into the home twice per week for 8 hours per day. Patient reports that this has decreased her stress and depression already.   [name] Self Care Activities:  . Performs ADL's independently . Calls provider office for new concerns or questions . Attends all scheduled provider appointments  Plan:  . Patient will call provider practice for  new needs/concerns. [name] LCSW willfollow up with patient next month to provide ongoing mental health resource education and support  Please see past updates related to this goal by clicking on the "Past Updates" button in the selected goal         .  DIET - INCREASE WATER INTAKE      Recommend drinking at least 6-8 glasses of water a day    .  Patient Stated      03/07/2020, trying to eat healthier    .  Weight (lb) < 200 lb (90.7 kg)      Pt would like to lose 10 lbs over the next year       Depression Screen PHQ 2/9 Scores 03/07/2020 11/23/2019 05/08/2019 03/05/2019 01/13/2019 10/10/2018 06/30/2018  PHQ - 2 Score 1 1 2 1 1 2 2   PHQ- 9 Score 3 6 7  - 9 10 14     Fall Risk Fall Risk  03/07/2020 05/08/2019 03/05/2019 06/30/2018 05/01/2018  Falls in the past year? 0 0 0 1 0  Number falls in past yr: - 0 0 0 -  Comment - - - - -  Injury with Fall? - 0 0 1 -  Comment - - - - -  Risk for fall due to : Medication side effect - - - -  Follow up Falls evaluation completed;Education provided;Falls prevention discussed Falls evaluation completed - - -    FALL RISK PREVENTION PERTAINING TO THE HOME:  Any stairs in or around the home? Yes  If so, are there any without handrails? No  Home free of loose throw rugs in walkways, pet beds, electrical cords, etc? Yes  Adequate lighting in your home to reduce risk of falls? Yes   ASSISTIVE DEVICES UTILIZED TO PREVENT FALLS:  Life alert? No  Use of a cane, walker or w/c? No  Grab bars in the bathroom? Yes  Shower chair or bench in shower? Yes  Elevated toilet seat or a handicapped toilet? No  TIMED UP AND GO:  Was the test performed? No .   Cognitive Function:     6CIT Screen 03/07/2020 02/27/2018 02/22/2017  What Year? 0 points 0 points 0 points  What month? 0 points 0 points 0 points  What time? 0 points 0 points 0 points  Count back from 20 0 points 0 points 0 points  Months in reverse 2 points 0 points 0 points  Repeat phrase 4 points 0  points 0 points  Total Score 6 0 0    Immunizations Immunization History  Administered Date(s) Administered  . Influenza Inj Mdck Quad Pf 11/28/2017  . Influenza, High Dose Seasonal PF 11/09/2016  . Influenza-Unspecified 11/22/2014, 12/12/2015, 01/07/2017, 11/28/2017, 12/16/2018  . PFIZER(Purple Top)SARS-COV-2 Vaccination 04/01/2019, 04/22/2019  . Pneumococcal Conjugate-13 02/22/2017  . Pneumococcal Polysaccharide-23 02/27/2018  . Tdap 10/07/2015  . Zoster 03/06/2013    TDAP status: Up to date  Flu Vaccine status: Up to date  Pneumococcal vaccine status: Up to date  Covid-19 vaccine status: Completed vaccines  Qualifies for Shingles Vaccine? Yes   Zostavax completed Yes   Shingrix Completed?: No.    Education has been provided regarding the importance of this vaccine. Patient has been advised to call insurance company to determine out of pocket expense if they have not yet received this vaccine. Advised may also receive vaccine at local pharmacy or Health Dept. Verbalized acceptance and understanding.  Screening Tests Health Maintenance  Topic Date Due  . COVID-19 Vaccine (3 - Booster for Pfizer series) 10/23/2019  . COLONOSCOPY (Pts 45-79yrs Insurance coverage will need to be confirmed)  04/15/2020  . MAMMOGRAM  12/23/2021  . TETANUS/TDAP  10/06/2025  . INFLUENZA VACCINE  Completed  . DEXA SCAN  Completed  . Hepatitis C Screening  Completed  . PNA vac Low Risk Adult  Completed    Health Maintenance  Health Maintenance Due  Topic Date Due  . COVID-19 Vaccine (3 - Booster for Pfizer series) 10/23/2019    Colorectal cancer screening: Type of screening: Colonoscopy. Completed 04/15/2017. Repeat every 10 years  Mammogram status: Completed 12/24/2019. Repeat every year  Bone Density status: Completed 04/11/2017.   Lung Cancer Screening: (Low Dose CT Chest recommended if Age 64-80 years, 30 pack-year currently smoking OR have quit w/in 15years.) does not qualify.    Lung Cancer Screening Referral: no  Additional Screening:  Hepatitis C Screening: does qualify; Completed 02/22/2017  Vision Screening: Recommended annual ophthalmology exams for early detection of glaucoma and other disorders of the eye. Is the patient up to date with their annual eye exam?  Yes  Who is the provider or what is the name of the office in which the patient attends annual eye exams? [facility] If pt is not established with a provider, would they like to be referred to a provider to establish care? No .   Dental Screening: Recommended annual dental exams for proper oral hygiene  Community Resource Referral / Chronic Care Management: CRR required this visit?  No   CCM required this visit?  No      Plan:     I have personally reviewed and noted the following in the patient's chart:   . Medical and social history . Use of alcohol, tobacco or illicit drugs  . Current medications and supplements . Functional ability and status . Nutritional status . Physical activity . Advanced directives . List of other physicians . Hospitalizations, surgeries, and ER visits in previous 12 months . Vitals . Screenings to  include cognitive, depression, and falls . Referrals and appointments  In addition, I have reviewed and discussed with patient certain preventive protocols, quality metrics, and best practice recommendations. A written personalized care plan for preventive services as well as general preventive health recommendations were provided to patient.     Kellie Simmering, LPN   [id number]   Nurse Notes:

## 2020-03-07 NOTE — Patient Instructions (Signed)
Marie Perry , Thank you for taking time to come for your Medicare Wellness Visit. I appreciate your ongoing commitment to your health goals. Please review the following plan we discussed and let me know if I can assist you in the future.   Screening recommendations/referrals: Colonoscopy: completed 04/15/2017, due 04/16/2027 Mammogram: completed 12/24/2019, due 12/23/2020 Bone Density: completed 04/11/2017 Recommended yearly ophthalmology/optometry visit for glaucoma screening and checkup Recommended yearly dental visit for hygiene and checkup  Vaccinations: Influenza vaccine: completed 11/16/2019, due 09/12/2020 Pneumococcal vaccine: completed 02/27/2018 Tdap vaccine: completed 10/07/2015, due 10/06/2025 Shingles vaccine: discussed   Covid-19: 11/26/2019, 04/22/2019, 04/01/2019  Advanced directives: copy in chart  Conditions/risks identified: none  Next appointment: Follow up in one year for your annual wellness visit    Preventive Care 26 Years and Older, Female Preventive care refers to lifestyle choices and visits with your health care provider that can promote health and wellness. What does preventive care include?  A yearly physical exam. This is also called an annual well check.  Dental exams once or twice a year.  Routine eye exams. Ask your health care provider how often you should have your eyes checked.  Personal lifestyle choices, including:  Daily care of your teeth and gums.  Regular physical activity.  Eating a healthy diet.  Avoiding tobacco and drug use.  Limiting alcohol use.  Practicing safe sex.  Taking low-dose aspirin every day.  Taking vitamin and mineral supplements as recommended by your health care provider. What happens during an annual well check? The services and screenings done by your health care provider during your annual well check will depend on your age, overall health, lifestyle risk factors, and family history of disease. Counseling  Your  health care provider may ask you questions about your:  Alcohol use.  Tobacco use.  Drug use.  Emotional well-being.  Home and relationship well-being.  Sexual activity.  Eating habits.  History of falls.  Memory and ability to understand (cognition).  Work and work Statistician.  Reproductive health. Screening  You may have the following tests or measurements:  Height, weight, and BMI.  Blood pressure.  Lipid and cholesterol levels. These may be checked every 5 years, or more frequently if you are over 64 years old.  Skin check.  Lung cancer screening. You may have this screening every year starting at age 63 if you have a 30-pack-year history of smoking and currently smoke or have quit within the past 15 years.  Fecal occult blood test (FOBT) of the stool. You may have this test every year starting at age 80.  Flexible sigmoidoscopy or colonoscopy. You may have a sigmoidoscopy every 5 years or a colonoscopy every 10 years starting at age 89.  Hepatitis C blood test.  Hepatitis B blood test.  Sexually transmitted disease (STD) testing.  Diabetes screening. This is done by checking your blood sugar (glucose) after you have not eaten for a while (fasting). You may have this done every 1-3 years.  Bone density scan. This is done to screen for osteoporosis. You may have this done starting at age 14.  Mammogram. This may be done every 1-2 years. Talk to your health care provider about how often you should have regular mammograms. Talk with your health care provider about your test results, treatment options, and if necessary, the need for more tests. Vaccines  Your health care provider may recommend certain vaccines, such as:  Influenza vaccine. This is recommended every year.  Tetanus, diphtheria, and acellular  pertussis (Tdap, Td) vaccine. You may need a Td booster every 10 years.  Zoster vaccine. You may need this after age 83.  Pneumococcal 13-valent  conjugate (PCV13) vaccine. One dose is recommended after age 27.  Pneumococcal polysaccharide (PPSV23) vaccine. One dose is recommended after age 69. Talk to your health care provider about which screenings and vaccines you need and how often you need them. This information is not intended to replace advice given to you by your health care provider. Make sure you discuss any questions you have with your health care provider. Document Released: 02/25/2015 Document Revised: 10/19/2015 Document Reviewed: 11/30/2014 Elsevier Interactive Patient Education  2017 [place] Prevention in the Home Falls can cause injuries. They can happen to people of all ages. There are many things you can do to make your home safe and to help prevent falls. What can I do on the outside of my home?  Regularly fix the edges of walkways and driveways and fix any cracks.  Remove anything that might make you trip as you walk through a door, such as a raised step or threshold.  Trim any bushes or trees on the path to your home.  Use bright outdoor lighting.  Clear any walking paths of anything that might make someone trip, such as rocks or tools.  Regularly check to see if handrails are loose or broken. Make sure that both sides of any steps have handrails.  Any raised decks and porches should have guardrails on the edges.  Have any leaves, snow, or ice cleared regularly.  Use sand or salt on walking paths during winter.  Clean up any spills in your garage right away. This includes oil or grease spills. What can I do in the bathroom?  Use night lights.  Install grab bars by the toilet and in the tub and shower. Do not use towel bars as grab bars.  Use non-skid mats or decals in the tub or shower.  If you need to sit down in the shower, use a plastic, non-slip stool.  Keep the floor dry. Clean up any water that spills on the floor as soon as it happens.  Remove soap buildup in the tub or  shower regularly.  Attach bath mats securely with double-sided non-slip rug tape.  Do not have throw rugs and other things on the floor that can make you trip. What can I do in the bedroom?  Use night lights.  Make sure that you have a light by your bed that is easy to reach.  Do not use any sheets or blankets that are too big for your bed. They should not hang down onto the floor.  Have a firm chair that has side arms. You can use this for support while you get dressed.  Do not have throw rugs and other things on the floor that can make you trip. What can I do in the kitchen?  Clean up any spills right away.  Avoid walking on wet floors.  Keep items that you use a lot in easy-to-reach places.  If you need to reach something above you, use a strong step stool that has a grab bar.  Keep electrical cords out of the way.  Do not use floor polish or wax that makes floors slippery. If you must use wax, use non-skid floor wax.  Do not have throw rugs and other things on the floor that can make you trip. What can I do with my stairs?  Do not leave any items on the stairs.  Make sure that there are handrails on both sides of the stairs and use them. Fix handrails that are broken or loose. Make sure that handrails are as long as the stairways.  Check any carpeting to make sure that it is firmly attached to the stairs. Fix any carpet that is loose or worn.  Avoid having throw rugs at the top or bottom of the stairs. If you do have throw rugs, attach them to the floor with carpet tape.  Make sure that you have a light switch at the top of the stairs and the bottom of the stairs. If you do not have them, ask someone to add them for you. What else can I do to help prevent falls?  Wear shoes that:  Do not have high heels.  Have rubber bottoms.  Are comfortable and fit you well.  Are closed at the toe. Do not wear sandals.  If you use a stepladder:  Make sure that it is fully  opened. Do not climb a closed stepladder.  Make sure that both sides of the stepladder are locked into place.  Ask someone to hold it for you, if possible.  Clearly mark and make sure that you can see:  Any grab bars or handrails.  First and last steps.  Where the edge of each step is.  Use tools that help you move around (mobility aids) if they are needed. These include:  Canes.  Walkers.  Scooters.  Crutches.  Turn on the lights when you go into a dark area. Replace any light bulbs as soon as they burn out.  Set up your furniture so you have a clear path. Avoid moving your furniture around.  If any of your floors are uneven, fix them.  If there are any pets around you, be aware of where they are.  Review your medicines with your doctor. Some medicines can make you feel dizzy. This can increase your chance of falling. Ask your doctor what other things that you can do to help prevent falls. This information is not intended to replace advice given to you by your health care provider. Make sure you discuss any questions you have with your health care provider. Document Released: 11/25/2008 Document Revised: 07/07/2015 Document Reviewed: 03/05/2014 Elsevier Interactive Patient Education  2017 [employer].

## 2020-04-08 ENCOUNTER — Ambulatory Visit (INDEPENDENT_AMBULATORY_CARE_PROVIDER_SITE_OTHER): Payer: Medicare Other | Admitting: Licensed Clinical Social Worker

## 2020-04-08 DIAGNOSIS — M810 Age-related osteoporosis without current pathological fracture: Secondary | ICD-10-CM | POA: Diagnosis not present

## 2020-04-08 DIAGNOSIS — F332 Major depressive disorder, recurrent severe without psychotic features: Secondary | ICD-10-CM | POA: Diagnosis not present

## 2020-04-08 DIAGNOSIS — E781 Pure hyperglyceridemia: Secondary | ICD-10-CM

## 2020-04-08 DIAGNOSIS — K219 Gastro-esophageal reflux disease without esophagitis: Secondary | ICD-10-CM

## 2020-04-08 NOTE — Patient Instructions (Signed)
Licensed Clinical Social Worker Visit Information  Materials provided: Yes: resources over the phone  Ms. Delamater was given information about Chronic Care Management services today including:  1. CCM service includes personalized support from designated clinical staff supervised by her physician, including individualized plan of care and coordination with other care providers 2. 24/7 contact phone numbers for assistance for urgent and routine care needs. 3. Service will only be billed when office clinical staff spend 20 minutes or more in a month to coordinate care. 4. Only one practitioner may furnish and bill the service in a calendar month. 5. The patient may stop CCM services at any time (effective at the end of the month) by phone call to the office staff. 6. The patient will be responsible for cost sharing (co-pay) of up to 20% of the service fee (after annual deductible is met).  Patient agreed to services and verbal consent obtained.

## 2020-04-08 NOTE — Chronic Care Management (AMB) (Signed)
Chronic Care Management    Clinical Social Work Note  04/08/2020 Name: Marie Perry MRN: [id number] DOB: 01/07/1951  Marie Perry is a 70 y.o. year old female who is a primary care patient of Cannady, Barbaraann Faster, NP. The CCM team was consulted to assist the patient with chronic disease management and/or care coordination needs related to: Mental Health Counseling and Resources and Caregiver Stress.   Engaged with patient by telephone for follow up visit in response to provider referral for social work chronic care management and care coordination services.   Consent to Services:  The patient was given the following information about Chronic Care Management services today, agreed to services, and gave verbal consent: 1. CCM service includes personalized support from designated clinical staff supervised by the primary care provider, including individualized plan of care and coordination with other care providers 2. 24/7 contact phone numbers for assistance for urgent and routine care needs. 3. Service will only be billed when office clinical staff spend 20 minutes or more in a month to coordinate care. 4. Only one practitioner may furnish and bill the service in a calendar month. 5.The patient may stop CCM services at any time (effective at the end of the month) by phone call to the office staff. 6. The patient will be responsible for cost sharing (co-pay) of up to 20% of the service fee (after annual deductible is met). Patient agreed to services and consent obtained.  Patient agreed to services and consent obtained.   Assessment: Review of patient past medical history, allergies, medications, and health status, including review of relevant consultants reports was performed today as part of a comprehensive evaluation and provision of chronic care management and care coordination services.     SDOH (Social Determinants of Health) assessments and interventions performed:    Advanced  Directives Status: See Care Plan for related entries.  CCM Care Plan  No Known Allergies  Outpatient Encounter Medications as of 04/08/2020  Medication Sig  . busPIRone (BUSPAR) 10 MG tablet Take 1 tablet by mouth twice daily  . Cholecalciferol (VITAMIN D PO) Take 2,000 Units by mouth daily.   . Cyanocobalamin (B-12 PO) Take 1,000 mcg by mouth daily.   . DULoxetine (CYMBALTA) 60 MG capsule Take 1 capsule (60 mg total) by mouth daily.  [name] omeprazole (PRILOSEC) 20 MG capsule Take 1 capsule by mouth once daily   No facility-administered encounter medications on file as of 04/08/2020.    Patient Active Problem List   Diagnosis Date Noted  . Vitamin B12 deficiency 02/27/2018  . Osteoporosis 04/15/2017  . Advanced care planning/counseling discussion 02/22/2017  . Hypertriglyceridemia 02/22/2017  . Hx of colonic polyps 04/13/2016  . Obesity (BMI 30.0-34.9) 10/07/2015  . IBS (irritable bowel syndrome) 04/29/2015  . Allergic rhinitis 04/29/2015  . GERD (gastroesophageal reflux disease) 04/29/2015  . Recurrent major depression-severe ([place]) 04/29/2015  . Vitamin D deficiency 04/29/2015  . Sleep apnea 04/29/2015    Conditions to be addressed/monitored: Depression; Mental Health Concerns   Care Plan : General Social Work (Adult)  Updates made by Greg Cutter, LCSW since 04/08/2020 12:00 AM    Problem: Response to Treatment (Depression)     Long-Range Goal: Response to Treatment Maximized   Start Date: 04/08/2020  Expected End Date: 05/02/2020  Recent Progress: On track  Priority: Medium  Note:    Timeframe:  Long-Range Goal Priority:  Medium  Start Date:  04/08/20  Expected End Date:  07/06/20                  Follow Up Date- 05/27/20  Current Barriers:  [name] Knowledge Deficits related to long term strategies for management of depression and caregiver stress . Lacks caregiver support. 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   Social Work Clinical  Goal(s):  [name] Over the next 90 days, patient will verbalize basic understanding of depression/stress process and self health management plan as evidenced by her participation in development of long term plan of care and institution of self health management strategies . Over the next 90 days, patient will be educated on healthy coping skills and will implement these appropriate self care methods into her daily routine to combat depression/anxiety. . Over the next 90 days, patient will be educated on appropriate mental health resources that are available to her as well as socialization opportunities.   Interventions:  . Evaluation of current treatment plan related to depression/caregiver stress and patient's adherence to plan as established by provider. . Patient is currently on medication for depression management and reports that it is effectively working for her but she still has symptoms of depression at times. Depression management coping skill education provided to patient.  . New Concern 03/04/20-Patient is having increased depression since her mother's recent diagnosis of moderate Alzheimer's disease. Her mother had a CT scan completed. She reports that her mother will go back to the neurologist next month. Emotional support and coping skill education provided to patient for this new stressor. [name] mother resides with patient.  . Patient interviewed and appropriate assessments performed . Provided mental health counseling with regard to depression/caregiver stress. Patient reports that her recent medication dosage increase is helping with her anxiety but not with her depression. Patient shares that she is not experiencing as much crying spells as before which is a great relief to her as they were mentally draining for her to experience daily.  . Education provided on how to implement appropriate self-care coping tools to combat triggers and stress. Patient receptive to education and reports  implementing appropriate self-care by reading and spending time outside.  . Discussed plans with patient for ongoing care management follow up and provided patient with direct contact information for care management team . Advised patient to contact LCSW for any ugrent social work needs . Assisted patient/caregiver with obtaining information about health plan benefits . Provided mental health counseling with regard to patient was emotional throughout phone call due to her daily difficulty with depression/anxiety and stress from being a caregiver. LCSW provided reflective listening and implemented appropriate interventions to help suppport patient and her emotional needs  . Patient reports that she is fully COVID vaccinated which has decreased some of her anxiety.  . Patient denies any SI/HI at this time. Patient admits that she has not been exercising or eating the best. Patient reports her only walking is to the mailbox. Self-care education provided. [name] LCSW provided mental health resource education and reminded patient that PCP sent behavioral health referral for therapy at last PCP visit. LCSW provided patient with Dr. Karleen Dolphin contact information as she believes this is the referral that her PCP sent. Patient is agreeable to contact Dr. Nicolasa Ducking to check on referral as well. Patient only prefers telehealth therapy due to fear of getting COVID. UPDATE- Patient reports that she did not hear back from [facility] and does not wish to contact them for services at this time  but will consider this resource if her symptoms continue. Patient does NOT want CCM LCSW to place new referral for [facility] at this time. [name] LCSW completed development of long term plan of care and institution of self health management strategies with patient . LCSW provided education on available socialization and senior opportunities within her nearby area. . Patient reports that her family had a meeting to discuss their concerns regarding  patient's recent caregiver burnout symptoms. Family hired an Engineer, production to come into the home twice per week for 8 hours per day. Patient reports that this has decreased her stress and depression already.  . Patient reports that she has been able to get out of the house more, run errands and get socialization with new aide implementation.   [name] Self Care Activities:  . Performs ADL's independently . Calls provider office for new concerns or questions . Attends all scheduled provider appointments  Plan:  . Patient will call provider practice for new needs/concerns. [name] LCSW willfollow up with patient next month to provide ongoing mental health resource education and support  Please see past updates related to this goal by clicking on the "Past Updates" button in the selected goal       Follow Up Plan: SW will follow up with patient by phone over the next quarter      Eula Fried, [place], MSW, [email or web address] Phone: [phone]

## 2020-05-26 ENCOUNTER — Encounter: Payer: Medicare Other | Admitting: Nurse Practitioner

## 2020-05-27 ENCOUNTER — Telehealth: Payer: Self-pay

## 2020-06-06 ENCOUNTER — Encounter: Payer: Self-pay | Admitting: Nurse Practitioner

## 2020-06-06 ENCOUNTER — Other Ambulatory Visit: Payer: Self-pay | Admitting: Nurse Practitioner

## 2020-06-06 ENCOUNTER — Telehealth: Payer: Self-pay

## 2020-06-06 ENCOUNTER — Ambulatory Visit (INDEPENDENT_AMBULATORY_CARE_PROVIDER_SITE_OTHER): Payer: Medicare Other | Admitting: Nurse Practitioner

## 2020-06-06 ENCOUNTER — Other Ambulatory Visit: Payer: Self-pay

## 2020-06-06 VITALS — BP 95/59 | HR 71 | Temp 99.1°F | Ht 66.5 in | Wt 176.6 lb

## 2020-06-06 DIAGNOSIS — K219 Gastro-esophageal reflux disease without esophagitis: Secondary | ICD-10-CM | POA: Diagnosis not present

## 2020-06-06 DIAGNOSIS — G4733 Obstructive sleep apnea (adult) (pediatric): Secondary | ICD-10-CM

## 2020-06-06 DIAGNOSIS — Z860100 Personal history of colon polyps, unspecified: Secondary | ICD-10-CM

## 2020-06-06 DIAGNOSIS — E559 Vitamin D deficiency, unspecified: Secondary | ICD-10-CM

## 2020-06-06 DIAGNOSIS — Z0000 Encounter for general adult medical examination without abnormal findings: Secondary | ICD-10-CM

## 2020-06-06 DIAGNOSIS — E781 Pure hyperglyceridemia: Secondary | ICD-10-CM

## 2020-06-06 DIAGNOSIS — M810 Age-related osteoporosis without current pathological fracture: Secondary | ICD-10-CM

## 2020-06-06 DIAGNOSIS — F332 Major depressive disorder, recurrent severe without psychotic features: Secondary | ICD-10-CM

## 2020-06-06 DIAGNOSIS — E538 Deficiency of other specified B group vitamins: Secondary | ICD-10-CM | POA: Diagnosis not present

## 2020-06-06 MED ORDER — DULOXETINE HCL 60 MG PO CPEP: 60.0000 mg | ORAL_CAPSULE | Freq: Every day | ORAL | 4 refills | Status: DC

## 2020-06-06 MED ORDER — BUSPIRONE HCL 10 MG PO TABS: 10.0000 mg | ORAL_TABLET | Freq: Two times a day (BID) | ORAL | 4 refills | Status: DC

## 2020-06-06 MED ORDER — OMEPRAZOLE 20 MG PO CPDR: 1.0000 | DELAYED_RELEASE_CAPSULE | Freq: Every day | ORAL | 4 refills | Status: DC

## 2020-06-06 NOTE — Assessment & Plan Note (Signed)
Refuses repeat colonoscopy at this time, discussed at length with her since this is due. Also refuses Cologuard.

## 2020-06-06 NOTE — Assessment & Plan Note (Signed)
Wears CPAP 100% of the time, continue this regimen.

## 2020-06-06 NOTE — Patient Instructions (Signed)

## 2020-06-06 NOTE — Assessment & Plan Note (Signed)
Ongoing.  Lipid panel today.  Continues to monitor diet. No medication at this time.  Consider initiation of medication if elevations noted or elevated ASCVD. 

## 2020-06-06 NOTE — Assessment & Plan Note (Signed)
Chronic, stable.  Continue daily supplement and recheck B12 level today, adjust as needed. ?

## 2020-06-06 NOTE — Assessment & Plan Note (Signed)
Chronic, ongoing.  Continue Prilosec and recommend trial reduction, slowly, to discontinuation = if poor tolerance then restart.  Educated on risks of long term use.  Mag level today.  Return in 6 months.

## 2020-06-06 NOTE — Assessment & Plan Note (Signed)
Chronic, ongoing with poor tolerance to bisphosphonate in past.  Could consider Prolia, which was discuss with her and provided her with education on this.  Continue to discuss with her at visits.  Repeat DEXA in November and consider referral endocrinology to discuss infusions, which she would prefer if treatment needed.

## 2020-06-06 NOTE — Telephone Encounter (Signed)
Can scripts be sent to [facility]

## 2020-06-06 NOTE — Assessment & Plan Note (Signed)
Chronic, stable.  Denies SI/HI.  Will continue current medication regimen at this time and adjust as needed.  Referral to therapy placed last visit, but she did not pursue.  May consider switch from Cymbalta to Sertraline in future if exacerbation of mood changes.  Return in 6 months or sooner if worsening.

## 2020-06-06 NOTE — Telephone Encounter (Signed)
Sent scripts to [facility]

## 2020-06-06 NOTE — Assessment & Plan Note (Signed)
Ongoing with underlying osteoporosis and poor tolerance Fosamax in past.  Continues on daily supplement.  Vit D level today. ?

## 2020-06-06 NOTE — Progress Notes (Signed)
BP (!) 95/59   Pulse 71   Temp 99.1 F (37.3 C) (Oral)   Ht 5' 6.5" (1.689 m)   Wt 176 lb 9.6 oz (80.1 kg)   LMP  (LMP Unknown)   SpO2 98%   BMI 28.08 kg/m    Subjective:    Patient ID: Marie Perry, female    DOB: 28-Feb-1950, 70 y.o.   MRN: [id number]  HPI: Illana Perry is a 70 y.o. female presenting on 06/06/2020 for comprehensive medical examination. Current medical complaints include:none  She currently lives with: mother Menopausal Symptoms: no   The 10-year ASCVD risk score [name] DC [name]., et al., 2013) is: 5.3%   Values used to calculate the score:     Age: 74 years     Sex: Female     Is Non-Hispanic African American: No     Diabetic: No     Tobacco smoker: No     Systolic Blood Pressure: 95 mmHg     Is BP treated: No     HDL Cholesterol: 44 mg/dL     Total Cholesterol: 217 mg/dL   VITAMIN D DEFICIENCY AND B12:  Takes daily supplements.  No recent falls or fractures.  Occasional muscle aches.  Recent Vit D level 32.2.  OSTEOPOROSIS In 2019 DEXA noted this with T -2.9.  Tried medications, but caused GI upset and GERD.  She is not interested in injections. Satisfied with current treatment?: no current treatment Past osteoporosis medications/treatments: Fosamax Adequate calcium & vitamin D: yes Intolerance to bisphosphonates:yes Weight bearing exercises: no   GERD Taking Prilosec 20 MG daily.  Has CPAP and used 100% of the time.   GERD control status: stable Satisfied with current treatment? 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 Has ongoing issues with anxiety/depression due to caregiver strain. Current medications include Cymbalta 60 MG and Buspar10MG   BID.Endorses this has been a difficult time due to not being able to get out on occasion due to concern for exposing her mother. Reports she has minimal socialization recently. Mood status:stable Satisfied with current treatment?:yes Symptom severity:moderate Duration of current treatment :chronic Side effects:no Medication compliance:good compliance Psychotherapy/counseling:currently with the SW Previous psychiatric medications:Cymbalta and Buspar Depressed mood:yes Anxious mood:no Anhedonia:no Significant weight loss or gain:no Insomnia:at times has issues falling asleep Fatigue:no Feelings of worthlessness or guilt:no Impaired concentration/indecisiveness:yes Suicidal ideations:no Hopelessness:no Crying spells:at times, but reports these have improved with increase in Buspar Depression screen [facility] 2/9 06/06/2020 03/07/2020 11/23/2019 05/08/2019 03/05/2019  Decreased Interest 3 0 0 1 0  Down, Depressed, Hopeless 2 1 1 1 1   PHQ - 2 Score 5 1 1 2 1   Altered sleeping 0 0 1 1 -  Tired, decreased energy 2 1 1 1  -  Change in appetite 2 0 1 1 -  Feeling bad or failure about yourself  1 0 1 1 -  Trouble concentrating 1 1 1 1  -  Moving slowly or fidgety/restless 0 0 0 0 -  Suicidal thoughts 0 0 0 0 -  PHQ-9 Score 11 3 6 7  -  Difficult doing work/chores - Not difficult at all Somewhat difficult Not difficult at all -  Some recent data might be hidden   The patient does not have a  history of falls. I did not complete a risk assessment for falls. A plan of care for falls was not documented.   Past Medical History:  Past Medical History:  Diagnosis Date  . Allergy   . Anxiety   . Arthritis   . Cataract 02/2016   Yearly eye exam w/Dr. Ellin Mayhew  . 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 Depression   . GERD (gastroesophageal reflux disease)   . Hypertriglyceridemia   . IBS (irritable bowel syndrome)   . IFG (impaired fasting glucose)   .  Osteopenia   . Sleep apnea   . Vitamin D deficiency     Surgical History:  Past Surgical History:  Procedure Laterality Date  . bright's surgery    . COLONOSCOPY N/A 09/17/2016   Procedure: COLONOSCOPY;  Surgeon: Lollie Sails, MD;  Location: [facility] ENDOSCOPY;  Service: Endoscopy;  Laterality: N/A;  . COLONOSCOPY WITH PROPOFOL N/A 04/15/2017   Procedure: COLONOSCOPY WITH PROPOFOL;  Surgeon: Lollie Sails, MD;  Location[facility] ENDOSCOPY;  Service: Endoscopy;  Laterality: N/A;  . SKIN TAG REMOVAL     rectal    Medications:  Current Outpatient Medications on File Prior to Visit  Medication Sig  . Cholecalciferol (VITAMIN D PO) Take 2,000 Units by mouth daily.   . Cyanocobalamin (B-12 PO) Take 1,000 mcg by mouth daily.    No current facility-administered medications on file prior to visit.    Allergies:  No Known Allergies  Social History:  Social History   Socioeconomic History  . Marital status: Single    Spouse name: Not on file  . Number of children: 0  . Years of education: Not on file  . Highest education level: Associate degree: academic program  Occupational History  . Occupation: retired    Comment: works part time  Tobacco Use  . Smoking status: Never Smoker  . Smokeless tobacco: Never Used  Vaping Use  . Vaping Use: Never used  Substance and Sexual Activity  . Alcohol use: Yes    Alcohol/week: 0.0 standard drinks    Comment: rarely indulge  . Drug use: Never  . Sexual activity: Never  Other Topics Concern  . Not on file  Social History Narrative  . Not on file   Social Determinants of Health   Financial Resource Strain: Low Risk   . Difficulty of Paying Living Expenses: Not hard at all  Food Insecurity: No Food Insecurity  . Worried About Charity fundraiser in the Last Year: Never true  . Ran Out of Food in the Last Year: Never true  Transportation Needs: No Transportation Needs  . Lack of Transportation (Medical): No  . Lack of  Transportation (Non-Medical): No  Physical Activity: Inactive  . Days of Exercise per Week: 0 days  . Minutes of Exercise per Session: 0 min  Stress: No Stress Concern Present  . Feeling of Stress : Not at all  Social Connections: Not on file  Intimate Partner Violence: Not on file   Social History   Tobacco Use  Smoking Status Never Smoker  Smokeless Tobacco Never Used   Social History   Substance and Sexual Activity  Alcohol Use Yes  . Alcohol/week: 0.0 standard drinks   Comment: rarely indulge    Family History:  Family History  Problem Relation Age of Onset  . Mental illness Mother   . Depression Mother   . Anxiety disorder Mother   . Hearing loss Mother   .  Miscarriages / [place] Mother   . Vision loss Mother   . Dementia Mother   . Parkinson's disease Mother   . Stroke Father   . Heart disease Father   . Hearing loss Father   . Vision loss Father   . Cancer Maternal Grandmother        ovarian  . Anemia Maternal Grandmother   . Arthritis Maternal Grandmother   . Lung disease Maternal Grandfather   . Birth defects Maternal Grandfather   . Asthma Maternal Grandfather   . Depression Sister   . Fibromyalgia Brother   . GER disease Brother   . Heart disease Paternal Grandmother   . Birth defects Paternal Grandfather   . Depression Brother   . Asthma Paternal Uncle   . Birth defects Sister   . Early death Sister   . Cancer Maternal Uncle   . Hearing loss Maternal Uncle   . Kidney disease Maternal Uncle   . Depression Brother   . Early death Paternal Uncle   . Hearing loss Maternal Aunt   . Vision loss Maternal Aunt   . Varicose Veins Maternal Aunt   . Heart disease Paternal Uncle   . Stroke Paternal Uncle   . Breast cancer Neg Hx     Past medical history, surgical history, medications, allergies, family history and social history reviewed with patient today and changes made to appropriate areas of the chart.   Review of Systems - negative All  other ROS negative except what is listed above and in the HPI.      Objective:    BP (!) 95/59   Pulse 71   Temp 99.1 F (37.3 C) (Oral)   Ht 5' 6.5" (1.689 m)   Wt 176 lb 9.6 oz (80.1 kg)   LMP  (LMP Unknown)   SpO2 98%   BMI 28.08 kg/m   Wt Readings from Last 3 Encounters:  06/06/20 176 lb 9.6 oz (80.1 kg)  03/07/20 173 lb (78.5 kg)  11/23/19 178 lb 3.2 oz (80.8 kg)    Physical Exam Vitals and nursing note reviewed.  Constitutional:      General: She is awake. She is not in acute distress.    Appearance: She is well-developed. She is not ill-appearing.  HENT:     Head: Normocephalic and atraumatic.     Right Ear: Hearing, tympanic membrane, ear canal and external ear normal. No drainage.     Left Ear: Hearing, tympanic membrane, ear canal and external ear normal. No drainage.     Nose: Nose normal.     Right Sinus: No maxillary sinus tenderness or frontal sinus tenderness.     Left Sinus: No maxillary sinus tenderness or frontal sinus tenderness.     Mouth/Throat:     Mouth: Mucous membranes are moist.     Pharynx: Oropharynx is clear. Uvula midline. No pharyngeal swelling, oropharyngeal exudate or posterior oropharyngeal erythema.  Eyes:     General: Lids are normal.        Right eye: No discharge.        Left eye: No discharge.     Extraocular Movements: Extraocular movements intact.     Conjunctiva/sclera: Conjunctivae normal.     Pupils: Pupils are equal, round, and reactive to light.     Visual Fields: Right eye visual fields normal and left eye visual fields normal.  Neck:     Thyroid: No thyromegaly.     Vascular: No carotid bruit.     Trachea:  Trachea normal.  Cardiovascular:     Rate and Rhythm: Normal rate and regular rhythm.     Heart sounds: Normal heart sounds. No murmur heard. No gallop.   Pulmonary:     Effort: Pulmonary effort is normal. No accessory muscle usage or respiratory distress.     Breath sounds: Normal breath sounds.  Chest:   Breasts:     Right: Normal. No axillary adenopathy or supraclavicular adenopathy.     Left: Normal. No axillary adenopathy or supraclavicular adenopathy.      Comments: Multiple skin tags around breast area.  Chaperone refused. Abdominal:     General: Bowel sounds are normal.     Palpations: Abdomen is soft. There is no hepatomegaly or splenomegaly.     Tenderness: There is no abdominal tenderness.  Musculoskeletal:        General: Normal range of motion.     Cervical back: Normal range of motion and neck supple.     Right lower leg: No edema.     Left lower leg: No edema.  Lymphadenopathy:     Head:     Right side of head: No submental, submandibular, tonsillar, preauricular or posterior auricular adenopathy.     Left side of head: No submental, submandibular, tonsillar, preauricular or posterior auricular adenopathy.     Cervical: No cervical adenopathy.     Upper Body:     Right upper body: No supraclavicular, axillary or pectoral adenopathy.     Left upper body: No supraclavicular, axillary or pectoral adenopathy.  Skin:    General: Skin is warm and dry.     Capillary Refill: Capillary refill takes less than 2 seconds.     Findings: No rash.  Neurological:     Mental Status: She is alert and oriented to person, place, and time.     Cranial Nerves: Cranial nerves are intact.     Gait: Gait is intact.     Deep Tendon Reflexes: Reflexes are normal and symmetric.     Reflex Scores:      Brachioradialis reflexes are 2+ on the right side and 2+ on the left side.      Patellar reflexes are 2+ on the right side and 2+ on the left side. Psychiatric:        Attention and Perception: Attention normal.        Mood and Affect: Mood normal.        Speech: Speech normal.        Behavior: Behavior normal. Behavior is cooperative.        Thought Content: Thought content normal.        Judgment: Judgment normal.     Results for orders placed or performed in visit on 05/08/19  CBC  with Differential/Platelet  Result Value Ref Range   WBC 7.8 3.4 - 10.8 x10E3/uL   RBC 5.14 3.77 - 5.28 x10E6/uL   Hemoglobin 15.3 11.1 - 15.9 g/dL   Hematocrit 46.3 34.0 - 46.6 %   MCV 90 79 - 97 fL   MCH 29.8 26.6 - 33.0 pg   MCHC 33.0 31.5 - 35.7 g/dL   RDW 13.0 11.7 - 15.4 %   Platelets 248 150 - 450 x10E3/uL   Neutrophils 63 Not Estab. %   Lymphs 29 Not Estab. %   Monocytes 6 Not Estab. %   Eos 2 Not Estab. %   Basos 0 Not Estab. %   Neutrophils Absolute 4.8 1.4 - 7.0 x10E3/uL   Lymphocytes Absolute 2.3  0.7 - 3.1 x10E3/uL   Monocytes Absolute 0.5 0.1 - 0.9 x10E3/uL   EOS (ABSOLUTE) 0.2 0.0 - 0.4 x10E3/uL   Basophils Absolute 0.0 0.0 - 0.2 x10E3/uL   Immature Granulocytes 0 Not Estab. %   Immature Grans (Abs) 0.0 0.0 - 0.1 x10E3/uL  Comprehensive metabolic panel  Result Value Ref Range   Glucose 95 65 - 99 mg/dL   BUN 20 8 - 27 mg/dL   Creatinine, Ser 0.96 0.57 - 1.00 mg/dL   GFR calc non Af Amer 61 >59 mL/min/1.73   GFR calc Af Amer 70 >59 mL/min/1.73   BUN/Creatinine Ratio 21 12 - 28   Sodium 141 134 - 144 mmol/L   Potassium 4.1 3.5 - 5.2 mmol/L   Chloride 104 96 - 106 mmol/L   CO2 23 20 - 29 mmol/L   Calcium 9.9 8.7 - 10.3 mg/dL   Total Protein 7.1 6.0 - 8.5 g/dL   Albumin 4.6 3.8 - 4.8 g/dL   Globulin, Total 2.5 1.5 - 4.5 g/dL   Albumin/Globulin Ratio 1.8 1.2 - 2.2   Bilirubin Total 0.6 0.0 - 1.2 mg/dL   Alkaline Phosphatase 92 39 - 117 IU/L   AST 23 0 - 40 IU/L   ALT 21 0 - 32 IU/L  Lipid Panel w/o Chol/HDL Ratio  Result Value Ref Range   Cholesterol, Total 217 (H) 100 - 199 mg/dL   Triglycerides 246 (H) 0 - 149 mg/dL   HDL 44 >39 mg/dL   VLDL Cholesterol Cal 44 (H) 5 - 40 mg/dL   LDL Chol Calc (NIH) 129 (H) 0 - 99 mg/dL  TSH  Result Value Ref Range   TSH 3.410 0.450 - 4.500 uIU/mL  VITAMIN D 25 Hydroxy (Vit-D Deficiency, Fractures)  Result Value Ref Range   Vit D, 25-Hydroxy 32.2 30.0 - 100.0 ng/mL  Vitamin B12  Result Value Ref Range   Vitamin  B-12 1,115 232 - 1,245 pg/mL      Assessment & Plan:   Problem List Items Addressed This Visit      Respiratory   Sleep apnea    Wears CPAP 100% of the time, continue this regimen.        Digestive   GERD (gastroesophageal reflux disease)    Chronic, ongoing.  Continue Prilosec and recommend trial reduction, slowly, to discontinuation = if poor tolerance then restart.  Educated on risks of long term use.  Mag level today.  Return in 6 months.      Relevant Medications   omeprazole (PRILOSEC) 20 MG capsule   Other Relevant Orders   CBC with Differential/Platelet   Comprehensive metabolic panel   TSH   Magnesium     Musculoskeletal and Integument   Osteoporosis    Chronic, ongoing with poor tolerance to bisphosphonate in past.  Could consider Prolia, which was discuss with her and provided her with education on this.  Continue to discuss with her at visits.  Repeat DEXA in November and consider referral endocrinology to discuss infusions, which she would prefer if treatment needed.      Relevant Orders   DG Bone Density   CBC with Differential/Platelet   VITAMIN D 25 Hydroxy (Vit-D Deficiency, Fractures)     Other   Recurrent major depression-severe (HCC) - Primary    Chronic, stable.  Denies SI/HI.  Will continue current medication regimen at this time and adjust as needed.  Referral to therapy placed last visit, but she did not pursue.  May consider switch from  Cymbalta to Sertraline in future if exacerbation of mood changes.  Return in 6 months or sooner if worsening.      Relevant Medications   DULoxetine (CYMBALTA) 60 MG capsule   busPIRone (BUSPAR) 10 MG tablet   Vitamin D deficiency    Ongoing with underlying osteoporosis and poor tolerance Fosamax in past.  Continues on daily supplement.  Vit D level today.      Hypertriglyceridemia    Ongoing.  Lipid panel today.  Continues to monitor diet. No medication at this time.  Consider initiation of medication if  elevations noted or elevated ASCVD.      Relevant Orders   Lipid Panel w/o Chol/HDL Ratio   TSH   Hx of colonic polyps    Refuses repeat colonoscopy at this time, discussed at length with her since this is due. Also refuses Cologuard.      Vitamin B12 deficiency    Chronic, stable.  Continue daily supplement and recheck B12 level today, adjust as needed.      Relevant Orders   Vitamin B12    Other Visit Diagnoses    Annual physical exam       Annual labs today.  Health maintenance reviewed and refuses colonoscopy at this time.       Follow up plan: Return in about 6 months (around 12/06/2020) for MOOD and GERD + HLD.   LABORATORY TESTING:  - Pap smear: not applicable  IMMUNIZATIONS:   - Tdap: Tetanus vaccination status reviewed: last tetanus booster within 10 years. - Influenza: Up to date - Pneumovax: Up to date - Prevnar: Up to date - HPV: Not applicable - Zostavax vaccine: Up to date  - Covid vaccines x 3  SCREENING: -Mammogram: Ordered today  - Colonoscopy: Refuses today -- will think about it or Cologuard - Bone Density: Up to date due next in 2022 --ordered -Hearing Test: Not applicable  -Spirometry: Not applicable   PATIENT COUNSELING:   Advised to take 1 mg of folate supplement per day if capable of pregnancy.   Sexuality: Discussed sexually transmitted diseases, partner selection, use of condoms, avoidance of unintended pregnancy  and contraceptive alternatives.   Advised to avoid cigarette smoking.  I discussed with the patient that most people either abstain from alcohol or drink within safe limits (<=14/week and <=4 drinks/occasion for males, <=7/weeks and <= 3 drinks/occasion for females) and that the risk for alcohol disorders and other health effects rises proportionally with the number of drinks per week and how often a drinker exceeds daily limits.  Discussed cessation/primary prevention of drug use and availability of treatment for abuse.    Diet: Encouraged to adjust caloric intake to maintain  or achieve ideal body weight, to reduce intake of dietary saturated fat and total fat, to limit sodium intake by avoiding high sodium foods and not adding table salt, and to maintain adequate dietary potassium and calcium preferably from fresh fruits, vegetables, and low-fat dairy products.    Stressed the importance of regular exercise  Injury prevention: Discussed safety belts, safety helmets, smoke detector, smoking near bedding or upholstery.   Dental health: Discussed importance of regular tooth brushing, flossing, and dental visits.    NEXT PREVENTATIVE PHYSICAL DUE IN 1 YEAR. Return in about 6 months (around 12/06/2020) for MOOD and GERD + HLD.

## 2020-06-06 NOTE — Telephone Encounter (Signed)
Copied from [place] [phone]. Topic: General - Other >> Jun 06, 2020 12:23 PM Leward Quan A wrote: Reason for CRM: Patient called in to inform [name] that she need her Rx's sent to the [id number] - [place], [place] - [address] Phone:  [phone]   Fax:  [phone]  Please advise

## 2020-06-07 LAB — COMPREHENSIVE METABOLIC PANEL
ALT: 13 IU/L (ref 0–32)
AST: 17 IU/L (ref 0–40)
Albumin/Globulin Ratio: 1.9 (ref 1.2–2.2)
Albumin: 4.5 g/dL (ref 3.8–4.8)
Alkaline Phosphatase: 95 IU/L (ref 44–121)
BUN/Creatinine Ratio: 23 (ref 12–28)
BUN: 19 mg/dL (ref 8–27)
Bilirubin Total: 0.5 mg/dL (ref 0.0–1.2)
CO2: 22 mmol/L (ref 20–29)
Calcium: 9.7 mg/dL (ref 8.7–10.3)
Chloride: 102 mmol/L (ref 96–106)
Creatinine, Ser: 0.82 mg/dL (ref 0.57–1.00)
Globulin, Total: 2.4 g/dL (ref 1.5–4.5)
Glucose: 83 mg/dL (ref 65–99)
Potassium: 3.9 mmol/L (ref 3.5–5.2)
Sodium: 141 mmol/L (ref 134–144)
Total Protein: 6.9 g/dL (ref 6.0–8.5)
eGFR: 77 mL/min/{1.73_m2} (ref >59)

## 2020-06-07 LAB — LIPID PANEL W/O CHOL/HDL RATIO
Cholesterol, Total: 225 mg/dL — ABNORMAL HIGH (ref 100–199)
HDL: 53 mg/dL (ref >39)
LDL Chol Calc (NIH): 144 mg/dL — ABNORMAL HIGH (ref 0–99)
Triglycerides: 157 mg/dL — ABNORMAL HIGH (ref 0–149)
VLDL Cholesterol Cal: 28 mg/dL (ref 5–40)

## 2020-06-07 LAB — CBC WITH DIFFERENTIAL/PLATELET
Basophils Absolute: 0 10*3/uL (ref 0.0–0.2)
Basos: 0 %
EOS (ABSOLUTE): 0.2 10*3/uL (ref 0.0–0.4)
Eos: 3 %
Hematocrit: 40.9 % (ref 34.0–46.6)
Hemoglobin: 13.6 g/dL (ref 11.1–15.9)
Immature Grans (Abs): 0 10*3/uL (ref 0.0–0.1)
Immature Granulocytes: 0 %
Lymphocytes Absolute: 2.1 10*3/uL (ref 0.7–3.1)
Lymphs: 31 %
MCH: 30 pg (ref 26.6–33.0)
MCHC: 33.3 g/dL (ref 31.5–35.7)
MCV: 90 fL (ref 79–97)
Monocytes Absolute: 0.5 10*3/uL (ref 0.1–0.9)
Monocytes: 7 %
Neutrophils Absolute: 3.9 10*3/uL (ref 1.4–7.0)
Neutrophils: 59 %
Platelets: 217 10*3/uL (ref 150–450)
RBC: 4.54 x10E6/uL (ref 3.77–5.28)
RDW: 13 % (ref 11.7–15.4)
WBC: 6.7 10*3/uL (ref 3.4–10.8)

## 2020-06-07 LAB — VITAMIN B12: Vitamin B-12: 1116 pg/mL (ref 232–1245)

## 2020-06-07 LAB — MAGNESIUM: Magnesium: 2.2 mg/dL (ref 1.6–2.3)

## 2020-06-07 LAB — VITAMIN D 25 HYDROXY (VIT D DEFICIENCY, FRACTURES): Vit D, 25-Hydroxy: 44.3 ng/mL (ref 30.0–100.0)

## 2020-06-07 LAB — TSH: TSH: 1.65 u[IU]/mL (ref 0.450–4.500)

## 2020-06-07 NOTE — Progress Notes (Signed)
Contacted via MyChart The 10-year ASCVD risk score [name] DC Jr., et al., 2013) is: 5.1%   Values used to calculate the score:     Age: 70 years     Sex: Female     Is Non-Hispanic African American: No     Diabetic: No     Tobacco smoker: No     Systolic Blood Pressure: 95 mmHg     Is BP treated: No     HDL Cholesterol: 53 mg/dL     Total Cholesterol: 225 mg/dL   Good afternoon Marie Perry, your labs have returned and overall everything remains stable with exception of your cholesterol levels.  Your cholesterol is still high, but continued recommendations to make lifestyle changes. Your LDL is above normal. The LDL is the bad cholesterol. Over time and in combination with inflammation and other factors, this contributes to plaque which in turn may lead to stroke and/or heart attack down the road. Sometimes high LDL is primarily genetic, and people might be eating all the right foods but still have high numbers. Other times, there is room for improvement in one's diet and eating healthier can bring this number down and potentially reduce one's risk of heart attack and/or stroke.   To reduce your LDL, Remember - more fruits and vegetables, more fish, and limit red meat and dairy products. More soy, nuts, beans, barley, lentils, oats and plant sterol ester enriched margarine instead of butter. I also encourage eliminating sugar and processed food. Remember, shop on the outside of the grocery store and visit your [employer]. If you would like to talk with me about dietary changes plus or Perry medications for your cholesterol, please let me know. We should recheck your cholesterol in 6 months.  Any questions? Keep being awesome!!  Thank you for allowing me to participate in your care. Kindest regards, Bowen Kia

## 2020-06-17 ENCOUNTER — Telehealth: Payer: Self-pay

## 2020-07-04 DIAGNOSIS — Z23 Encounter for immunization: Secondary | ICD-10-CM | POA: Diagnosis not present

## 2020-07-14 ENCOUNTER — Telehealth: Payer: Self-pay

## 2020-07-14 ENCOUNTER — Telehealth: Payer: Self-pay | Admitting: Licensed Clinical Social Worker

## 2020-07-14 NOTE — Telephone Encounter (Signed)
    Clinical Social Work  Chronic Care Management   Phone Outreach    07/14/2020 Name: Shiryl Ruddy MRN: [id number] DOB: 16-Apr-1950  Catori Panozzo is a 70 y.o. year old female who is a primary care patient of Cannady, Barbaraann Faster, NP .   CCM LCSW reached out to patient today by phone to introduce self, assess needs and offer Care Management services and interventions.    Telephone outreach was unsuccessful  Plan:CCM LCSW will wait for return call. If no return call is received, Will reach out to patient again in the next 14 days .   Review of patient status, including review of consultants reports, relevant laboratory and other test results, and collaboration with appropriate care team members and the patient's provider was performed as part of comprehensive patient evaluation and provision of care management services.    Christa See, MSW, [email or web address] Phone [phone] 5:53 PM

## 2020-09-30 DIAGNOSIS — H1045 Other chronic allergic conjunctivitis: Secondary | ICD-10-CM | POA: Diagnosis not present

## 2020-09-30 DIAGNOSIS — H2513 Age-related nuclear cataract, bilateral: Secondary | ICD-10-CM | POA: Diagnosis not present

## 2020-09-30 DIAGNOSIS — H25013 Cortical age-related cataract, bilateral: Secondary | ICD-10-CM | POA: Diagnosis not present

## 2020-11-03 DIAGNOSIS — Z23 Encounter for immunization: Secondary | ICD-10-CM | POA: Diagnosis not present

## 2020-11-09 ENCOUNTER — Other Ambulatory Visit: Payer: Self-pay | Admitting: Nurse Practitioner

## 2020-11-09 DIAGNOSIS — Z1231 Encounter for screening mammogram for malignant neoplasm of breast: Secondary | ICD-10-CM

## 2020-11-15 ENCOUNTER — Ambulatory Visit (INDEPENDENT_AMBULATORY_CARE_PROVIDER_SITE_OTHER): Payer: Medicare Other | Admitting: Licensed Clinical Social Worker

## 2020-11-15 DIAGNOSIS — G4733 Obstructive sleep apnea (adult) (pediatric): Secondary | ICD-10-CM

## 2020-11-15 DIAGNOSIS — F332 Major depressive disorder, recurrent severe without psychotic features: Secondary | ICD-10-CM

## 2020-11-15 DIAGNOSIS — K589 Irritable bowel syndrome without diarrhea: Secondary | ICD-10-CM

## 2020-11-15 DIAGNOSIS — E781 Pure hyperglyceridemia: Secondary | ICD-10-CM

## 2020-11-15 NOTE — Chronic Care Management (AMB) (Signed)
    Clinical Social Work  Chronic Care Management   Phone Outreach    11/15/2020 Name: Danica Camarena MRN: [id number] DOB: 07-29-1950  [name] is a 70 y.o. year old female who is a primary care patient of Cannady, Barbaraann Faster, NP .   Reason for referral: Mental Health Counseling and Resources and Caregiver Stress.    CCM LCSW reached out to patient today by phone to introduce self, assess needs and offer Care Management services and interventions.     Plan: Appointment was scheduled with CCM LCSW for 11/25/20  Review of patient status, including review of consultants reports, relevant laboratory and other test results, and collaboration with appropriate care team members and the patient's provider was performed as part of comprehensive patient evaluation and provision of care management services.    Christa See, MSW, [email or web address] Phone [phone] 9:59 AM

## 2020-11-23 DIAGNOSIS — Z23 Encounter for immunization: Secondary | ICD-10-CM | POA: Diagnosis not present

## 2020-11-25 ENCOUNTER — Ambulatory Visit: Payer: Medicare Other | Admitting: Licensed Clinical Social Worker

## 2020-11-25 DIAGNOSIS — K589 Irritable bowel syndrome without diarrhea: Secondary | ICD-10-CM

## 2020-11-25 DIAGNOSIS — F332 Major depressive disorder, recurrent severe without psychotic features: Secondary | ICD-10-CM

## 2020-11-25 DIAGNOSIS — E781 Pure hyperglyceridemia: Secondary | ICD-10-CM

## 2020-11-25 DIAGNOSIS — M810 Age-related osteoporosis without current pathological fracture: Secondary | ICD-10-CM

## 2020-11-25 NOTE — Chronic Care Management (AMB) (Signed)
    Clinical Social Work  Care Management   Phone Outreach    11/25/2020 Name: Marie Perry MRN: [id number] DOB: [birth year]  Marie Perry is a 70 y.o. year old female who is a primary care patient of Cannady, Barbaraann Faster, NP .   Reason for referral: Mental Health Counseling and Resources.    CCM LCSW contacted patient to complete initial CCM appointment approximately 30 minutes after scheduled appointment time. Patient shared that her power was restored after being out earlier in the morning. Patient was unable to keep phone appointment today and requested to reschedule.  Plan:Appointment was rescheduled with CCM 12/02/20  Review of patient status, including review of consultants reports, relevant laboratory and other test results, and collaboration with appropriate care team members and the patient's provider was performed as part of comprehensive patient evaluation and provision of care management services.    Christa See, MSW, [email or web address] Phone [phone] 2:54 PM

## 2020-12-02 ENCOUNTER — Ambulatory Visit: Payer: Medicare Other | Admitting: Licensed Clinical Social Worker

## 2020-12-02 DIAGNOSIS — F332 Major depressive disorder, recurrent severe without psychotic features: Secondary | ICD-10-CM

## 2020-12-02 DIAGNOSIS — M810 Age-related osteoporosis without current pathological fracture: Secondary | ICD-10-CM

## 2020-12-06 NOTE — Chronic Care Management (AMB) (Signed)
Chronic Care Management    Clinical Social Work Note  12/06/2020 Name: Marie Perry MRN: [id number] DOB: 03-01-1950  Marie Perry is a 70 y.o. year old female who is a primary care patient of Cannady, Barbaraann Faster, NP. The CCM team was consulted to assist the patient with chronic disease management and/or care coordination needs related to: Mental Health Counseling and Resources and Caregiver Stress.   Engaged with patient by telephone for follow up visit in response to provider referral for social work chronic care management and care coordination services.   Consent to Services:  The patient was given information about Chronic Care Management services, agreed to services, and gave verbal consent prior to initiation of services.  Please see initial visit note for detailed documentation.   Patient agreed to services and consent obtained.   Consent to Services:  The patient was given information about Care Management services, agreed to services, and gave verbal consent prior to initiation of services.  Please see initial visit note for detailed documentation.   Patient agreed to services today and consent obtained.  Engaged with patient by phone in response to provider referral for social work care coordination services:  Assessment/Interventions:  Patient continues to maintain positive progress with care plan goals. She continues to participate in med management and is receiving assistance in providing care to mother. Patient reports financial strain regarding food insecurity and CCM LCSW completed referral to Care Guide to assist. Self-care strategies were discussed to promote relaxation and symptom management.  See Care Plan below for interventions and patient self-care activities.  Recent life changes or stressors: Caregiver stress and financial strain  Recommendation: Patient may benefit from, and is in agreement work with LCSW to address care coordination needs and will  continue to work with the clinical team to address health care and disease management related needs.   Follow up Plan: Patient would like continued follow-up from CCM LCSW .  per patient's request will follow up in 121/16/22.  Will call office if needed prior to next encounter.    SDOH (Social Determinants of Health) assessments and interventions performed:  Food insecurity  Advanced Directives Status: Not addressed in this encounter.  CCM Care Plan  No Known Allergies  Outpatient Encounter Medications as of 12/02/2020  Medication Sig   busPIRone (BUSPAR) 10 MG tablet Take 1 tablet (10 mg total) by mouth 2 (two) times daily.   Cholecalciferol (VITAMIN D PO) Take 2,000 Units by mouth daily.    Cyanocobalamin (B-12 PO) Take 1,000 mcg by mouth daily.    DULoxetine (CYMBALTA) 60 MG capsule Take 1 capsule (60 mg total) by mouth daily.   omeprazole (PRILOSEC) 20 MG capsule Take 1 capsule (20 mg total) by mouth daily.   No facility-administered encounter medications on file as of 12/02/2020.    Patient Active Problem List   Diagnosis Date Noted   Vitamin B12 deficiency 02/27/2018   Osteoporosis 04/15/2017   Advanced care planning/counseling discussion 02/22/2017   Hypertriglyceridemia 02/22/2017   Hx of colonic polyps 04/13/2016   IBS (irritable bowel syndrome) 04/29/2015   Allergic rhinitis 04/29/2015   GERD (gastroesophageal reflux disease) 04/29/2015   Recurrent major depression-severe ([place]) 04/29/2015   Vitamin D deficiency 04/29/2015   Sleep apnea 04/29/2015    Conditions to be addressed/monitored: Depression; Limited access to food, Mental Health Concerns , and Caregiver Stress  Care Plan : General Social Work (Adult)  Updates made by Rebekah Chesterfield, LCSW since 12/06/2020 12:00 AM  Problem: Response to Treatment (Depression)      Long-Range Goal: Response to Treatment Maximized   Start Date: 04/08/2020  Expected End Date: 05/02/2020  This Visit's Progress: On track   Recent Progress: On track  Priority: Medium  Note:   Timeframe:  Long-Range Goal Priority:  Medium  Start Date:  04/08/20                         Expected End Date:  07/06/20                 Follow Up Date- 01/27/21  Current Barriers:  Knowledge Deficits related to long term strategies for management of depression and caregiver stress Lacks caregiver support. 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  Social Work Clinical Goal(s):  Over the next 90 days, patient will verbalize basic understanding of depression/stress process and self health management plan as evidenced by her participation in development of long term plan of care and institution of self health management strategies Over the next 90 days, patient will be educated on healthy coping skills and will implement these appropriate self care methods into her daily routine to combat depression/anxiety. Over the next 90 days, patient will be educated on appropriate mental health resources that are available to her as well as socialization opportunities.  Interventions:  Patient interviewed and appropriate assessments performed Patient is currently on medication for depression management and reports that it is effectively working for her but she still has symptoms of depression at times. Depression management coping skill education provided to patient. 10/21: Patient continues to participate in med management, in addition, to taking supplements (B12, D3, and a multivitamin) Patient requests follow up from PCP regarding recent concerns that Buspar causes memory concerns.  Patient reports difficulty managing depression and anxiety symptoms triggered by caregiver strain. [name] mother resides with her and was diagnosed with moderate Alzheimer's disease. Emotional support and coping skill education provided to patient 10/21: Patient reports that she is currently receiving assistance with caring for mother throughout the week. She has  a paid aid, in addition, to a close family friend that pt's mother is comfortable with. [name] siblings also take turns visiting monthly.  Patient reports financial strain due to having to make repairs to appliances in the home, in addition, to her vehicle. She is employed part-time; however, pays for mother's daily needs out of pocket and shared food insecurity, at times. CCM LCSW completed a referral to Care Guide to assist with supportive resources and informed patient of free incontinence supplies through [employer] [name] mother is receiving Meals on Wheels. She is not interested in participating in day centers Patient was successful in identifying healthy coping skills to assist with stress management (prayer and going out once a week) Patient does not have much availability for support groups; however, enjoys reading articles regarding caregiving Provided mental health counseling with regard to depression/caregiver stress. Patient denies any SI/HI at this time Education provided on how to implement appropriate self-care coping tools to combat triggers and stress. Patient receptive to education and reports implementing appropriate self-care by reading and spending time outside LCSW provided reflective listening and implemented appropriate interventions to help suppport patient and her emotional needs  LCSW provided mental health resource education and reminded patient that PCP sent behavioral health referral for therapy at last PCP visit. Patient reports that she is not interested in therapy but will consider this resource if her symptoms continue. Patient  does NOT want CCM LCSW to place new referral for [facility] at this time LCSW provided education on available socialization and senior opportunities within her nearby area Discussed plans with patient for ongoing care management follow up and provided patient with direct contact information for care management team Evaluation of current treatment  plan related to depression/caregiver stress and patient's adherence to plan as established by provider [name] Self Care Activities:  Call provider office for new concerns or questions Attend all scheduled provider appointments Utilize healthy coping skills discussed Follow up with [facility] and Care guide regarding supportive resources        Christa See, [email or web address] Phone [phone] 6:25 AM

## 2020-12-06 NOTE — Patient Instructions (Signed)
Visit Information   Goals Addressed               This Visit's Progress     Patient Stated     "I wouldn't mind having some extra help managing my depression and stress" (pt-stated)   On track     [name] Self Care Activities:  Call provider office for new concerns or questions Attend all scheduled provider appointments Utilize healthy coping skills discussed Follow up with [facility] and Care guide regarding supportive resources        Patient verbalizes understanding of instructions provided today and agrees to view in [place].   Telephone follow up appointment with care management team member scheduled for:01/27/21  Christa See, MSW, [email or web address] Phone [phone] 6:29 AM

## 2020-12-09 ENCOUNTER — Ambulatory Visit (INDEPENDENT_AMBULATORY_CARE_PROVIDER_SITE_OTHER): Payer: Medicare Other | Admitting: Nurse Practitioner

## 2020-12-09 ENCOUNTER — Other Ambulatory Visit: Payer: Self-pay

## 2020-12-09 ENCOUNTER — Encounter: Payer: Self-pay | Admitting: Nurse Practitioner

## 2020-12-09 VITALS — BP 135/66 | HR 76 | Temp 98.5°F | Wt 190.2 lb

## 2020-12-09 DIAGNOSIS — K219 Gastro-esophageal reflux disease without esophagitis: Secondary | ICD-10-CM | POA: Diagnosis not present

## 2020-12-09 DIAGNOSIS — M810 Age-related osteoporosis without current pathological fracture: Secondary | ICD-10-CM | POA: Diagnosis not present

## 2020-12-09 DIAGNOSIS — F332 Major depressive disorder, recurrent severe without psychotic features: Secondary | ICD-10-CM

## 2020-12-09 DIAGNOSIS — E782 Mixed hyperlipidemia: Secondary | ICD-10-CM | POA: Diagnosis not present

## 2020-12-09 DIAGNOSIS — G4733 Obstructive sleep apnea (adult) (pediatric): Secondary | ICD-10-CM | POA: Diagnosis not present

## 2020-12-09 MED ORDER — ROSUVASTATIN CALCIUM 10 MG PO TABS: 10.0000 mg | ORAL_TABLET | Freq: Every day | ORAL | 4 refills | Status: DC

## 2020-12-09 NOTE — Assessment & Plan Note (Signed)
Wears CPAP 100% of the time, continue this regimen.

## 2020-12-09 NOTE — Assessment & Plan Note (Signed)
Ongoing with ASCVD 9.9%.  Discussed at length risks and benefits of starting statin.  Will start Crestor 10 MG daily and adjust as needed.  Labs CMP and lipid panel today.

## 2020-12-09 NOTE — Progress Notes (Signed)
BP 135/66   Pulse 76   Temp 98.5 F (36.9 C) (Oral)   Wt 190 lb 3.2 oz (86.3 kg)   LMP  (LMP Unknown)   SpO2 98%   BMI 30.24 kg/m    Subjective:    Patient ID: Brianne Maina, female    DOB: 12/19/1950, 70 y.o.   MRN: [id number]  HPI: Ellenore Roscoe is a 70 y.o. female  Chief Complaint  Patient presents with   Mood   Gastroesophageal Reflux   Hyperlipidemia   Medication Problem    Patient states she would like to discuss her medication Buspar as she read somewhere that it could cause memory issues and she would to discuss cholesterol medication as she has been informed her LDL is elevated.    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  Ongoing issues with anxiety/depression due to caregiver strain.  She had read somewhere that Buspar causes memory issues -- reviewed with patient side effects of this medication.  Current medications include Cymbalta 60 MG and Buspar 10 MG BID.  Has CPAP and used 100% of the time.   Mood status: stable Satisfied with current treatment?: yes Symptom severity: moderate  Duration of current treatment : chronic Side effects: no Medication compliance: good compliance Psychotherapy/counseling: currently with the SW Previous psychiatric medications: Cymbalta and Buspar Depressed mood: yes Anxious mood: no Anhedonia: no Significant weight loss or gain: no Insomnia: at times has issues falling asleep Fatigue: no Feelings of worthlessness or guilt: no Impaired concentration/indecisiveness: occasional Suicidal ideations: no Hopelessness: no Crying spells: occasional Depression screen [facility] 2/9 12/09/2020 06/06/2020 03/07/2020 11/23/2019 05/08/2019  Decreased Interest 1 3 0 0 1  Down, Depressed, Hopeless '1 2 1 1 1  ' PHQ - 2 Score '2 5 1 1 2  ' Altered sleeping 2 0 0 1 1  Tired, decreased energy '2 2 1 1 1  ' Change in appetite 2 2 0 1 1  Feeling bad or failure about yourself  1 1 0 1 1  Trouble concentrating '1 1 1 1 1   ' Moving slowly or fidgety/restless 1 0 0 0 0  Suicidal thoughts 0 0 0 0 0  PHQ-9 Score '11 11 3 6 7  ' Difficult doing work/chores Somewhat difficult - Not difficult at all Somewhat difficult Not difficult at all  Some recent data might be hidden    GAD 7 : Generalized Anxiety Score 12/09/2020 10/10/2018 06/30/2018 05/01/2018  Nervous, Anxious, on Edge '2 1 1 2  ' Control/stop worrying '1 1 1 2  ' Worry too much - different things '1 1 3 2  ' Trouble relaxing '1 1 3 1  ' Restless 0 '1 2 1  ' Easily annoyed or irritable 0 0 0 0  Afraid - awful might happen 1 2 0 1  Total GAD 7 Score '6 7 10 9  ' Anxiety Difficulty Somewhat difficult Somewhat difficult Very difficult Somewhat difficult    OSTEOPOROSIS In 2019 DEXA noted this with T -2.9, repeat DEXA ordered in April 2022 but has not obtained.    Tried medications, but caused GI upset and GERD.  She is not interested in injections. Satisfied with current treatment?: no current treatment Past osteoporosis medications/treatments: Fosamax Adequate calcium & vitamin D: yes Intolerance to bisphosphonates:yes Weight bearing exercises: no    GERD Taking Prilosec 20 MG daily.   GERD control status: stable Satisfied with current treatment? no Heartburn frequency: occasional Medication side effects: no  Medication compliance: fluctuating Previous GERD medications: TUMS and Prilosec  Antacid use frequency:  occasional Dysphagia: no Odynophagia:  no Hematemesis: no Blood in stool: no EGD: no   HYPERLIPIDEMIA No current medications for this. Hyperlipidemia status: good compliance Satisfied with current treatment?  yes Side effects:  no Medication compliance: good compliance Past cholesterol meds:  Supplements: none Aspirin:  no The 10-year ASCVD risk score (Arnett DK, et al., 2019) is: 9.9%   Values used to calculate the score:     Age: 17 years     Sex: Female     Is Non-Hispanic African American: No     Diabetic: No     Tobacco smoker: No      Systolic Blood Pressure: 622 mmHg     Is BP treated: No     HDL Cholesterol: 53 mg/dL     Total Cholesterol: 225 mg/dL Chest pain:  no Coronary artery disease:  no Family history CAD:  yes Family history early CAD:  yes  Relevant past medical, surgical, family and social history reviewed and updated as indicated. Interim medical history since our last visit reviewed. Allergies and medications reviewed and updated.  Review of Systems  Constitutional:  Negative for activity change, appetite change, diaphoresis, fatigue and fever.  Respiratory:  Negative for cough, chest tightness and shortness of breath.   Cardiovascular:  Negative for chest pain, palpitations and leg swelling.  Gastrointestinal: Negative.   Neurological: Negative.   Psychiatric/Behavioral: Negative.     Per HPI unless specifically indicated above     Objective:    BP 135/66   Pulse 76   Temp 98.5 F (36.9 C) (Oral)   Wt 190 lb 3.2 oz (86.3 kg)   LMP  (LMP Unknown)   SpO2 98%   BMI 30.24 kg/m   Wt Readings from Last 3 Encounters:  12/09/20 190 lb 3.2 oz (86.3 kg)  06/06/20 176 lb 9.6 oz (80.1 kg)  03/07/20 173 lb (78.5 kg)    Physical Exam Vitals and nursing note reviewed.  Constitutional:      Appearance: She is well-developed.  HENT:     Head: Normocephalic.  Eyes:     General:        Right eye: No discharge.        Left eye: No discharge.     Conjunctiva/sclera: Conjunctivae normal.     Pupils: Pupils are equal, round, and reactive to light.  Neck:     Thyroid: No thyromegaly.     Vascular: No carotid bruit or JVD.  Cardiovascular:     Rate and Rhythm: Normal rate and regular rhythm.     Heart sounds: Normal heart sounds.  Pulmonary:     Effort: Pulmonary effort is normal.     Breath sounds: Normal breath sounds.  Abdominal:     General: Bowel sounds are normal.     Palpations: Abdomen is soft.  Musculoskeletal:     Cervical back: Normal range of motion and neck supple.   Lymphadenopathy:     Cervical: No cervical adenopathy.  Skin:    General: Skin is warm and dry.  Neurological:     Mental Status: She is alert and oriented to person, place, and time.  Psychiatric:        Attention and Perception: Attention normal.        Mood and Affect: Mood normal.        Speech: Speech normal.        Behavior: Behavior normal.        Thought Content: Thought content normal.  Results for orders placed or performed in visit on 06/06/20  CBC with Differential/Platelet  Result Value Ref Range   WBC 6.7 3.4 - 10.8 x10E3/uL   RBC 4.54 3.77 - 5.28 x10E6/uL   Hemoglobin 13.6 11.1 - 15.9 g/dL   Hematocrit 40.9 34.0 - 46.6 %   MCV 90 79 - 97 fL   MCH 30.0 26.6 - 33.0 pg   MCHC 33.3 31.5 - 35.7 g/dL   RDW 13.0 11.7 - 15.4 %   Platelets 217 150 - 450 x10E3/uL   Neutrophils 59 Not Estab. %   Lymphs 31 Not Estab. %   Monocytes 7 Not Estab. %   Eos 3 Not Estab. %   Basos 0 Not Estab. %   Neutrophils Absolute 3.9 1.4 - 7.0 x10E3/uL   Lymphocytes Absolute 2.1 0.7 - 3.1 x10E3/uL   Monocytes Absolute 0.5 0.1 - 0.9 x10E3/uL   EOS (ABSOLUTE) 0.2 0.0 - 0.4 x10E3/uL   Basophils Absolute 0.0 0.0 - 0.2 x10E3/uL   Immature Granulocytes 0 Not Estab. %   Immature Grans (Abs) 0.0 0.0 - 0.1 x10E3/uL  Comprehensive metabolic panel  Result Value Ref Range   Glucose 83 65 - 99 mg/dL   BUN 19 8 - 27 mg/dL   Creatinine, Ser 0.82 0.57 - 1.00 mg/dL   eGFR 77 >59 mL/min/1.73   BUN/Creatinine Ratio 23 12 - 28   Sodium 141 134 - 144 mmol/L   Potassium 3.9 3.5 - 5.2 mmol/L   Chloride 102 96 - 106 mmol/L   CO2 22 20 - 29 mmol/L   Calcium 9.7 8.7 - 10.3 mg/dL   Total Protein 6.9 6.0 - 8.5 g/dL   Albumin 4.5 3.8 - 4.8 g/dL   Globulin, Total 2.4 1.5 - 4.5 g/dL   Albumin/Globulin Ratio 1.9 1.2 - 2.2   Bilirubin Total 0.5 0.0 - 1.2 mg/dL   Alkaline Phosphatase 95 44 - 121 IU/L   AST 17 0 - 40 IU/L   ALT 13 0 - 32 IU/L  Lipid Panel w/o Chol/HDL Ratio  Result Value Ref Range    Cholesterol, Total 225 (H) 100 - 199 mg/dL   Triglycerides 157 (H) 0 - 149 mg/dL   HDL 53 >39 mg/dL   VLDL Cholesterol Cal 28 5 - 40 mg/dL   LDL Chol Calc (NIH) 144 (H) 0 - 99 mg/dL  TSH  Result Value Ref Range   TSH 1.650 0.450 - 4.500 uIU/mL  VITAMIN D 25 Hydroxy (Vit-D Deficiency, Fractures)  Result Value Ref Range   Vit D, 25-Hydroxy 44.3 30.0 - 100.0 ng/mL  Vitamin B12  Result Value Ref Range   Vitamin B-12 1,116 232 - 1,245 pg/mL  Magnesium  Result Value Ref Range   Magnesium 2.2 1.6 - 2.3 mg/dL      Assessment & Plan:   Problem List Items Addressed This Visit       Respiratory   Sleep apnea    Wears CPAP 100% of the time, continue this regimen.        Digestive   GERD (gastroesophageal reflux disease)    Chronic, ongoing.  Continue Prilosec and recommend trial reduction, slowly, to discontinuation = if poor tolerance then restart.  Educated on risks of long term use.  Mag level at physical.  Return in 6 months.        Musculoskeletal and Integument   Osteoporosis    Chronic, ongoing with poor tolerance to bisphosphonate in past.  Could consider Prolia, which was discussed with her  and provided her with education on this.  Continue to discuss with her at visits.  Repeat DEXA upcoming and consider referral endocrinology to discuss infusions, which she would prefer if treatment needed.        Other   Recurrent major depression-severe ([facility]) - Primary    Chronic, stable.  Denies SI/HI.  Will continue current medication regimen at this time and adjust as needed.  May consider switch from Cymbalta to Sertraline in future if exacerbation or mood changes.  Return in 6 months or sooner if worsening.      Mixed hyperlipidemia    Ongoing with ASCVD 9.9%.  Discussed at length risks and benefits of starting statin.  Will start Crestor 10 MG daily and adjust as needed.  Labs CMP and lipid panel today.      Relevant Medications   rosuvastatin (CRESTOR) 10 MG tablet   Other  Relevant Orders   Comprehensive metabolic panel   Lipid Panel w/o Chol/HDL Ratio     Follow up plan: Return in about 6 months (around 06/07/2021) for Annual Physical.

## 2020-12-09 NOTE — Assessment & Plan Note (Signed)
Chronic, ongoing with poor tolerance to bisphosphonate in past.  Could consider Prolia, which was discussed with her and provided her with education on this.  Continue to discuss with her at visits.  Repeat DEXA upcoming and consider referral endocrinology to discuss infusions, which she would prefer if treatment needed.

## 2020-12-09 NOTE — Assessment & Plan Note (Signed)
Chronic, ongoing.  Continue Prilosec and recommend trial reduction, slowly, to discontinuation = if poor tolerance then restart.  Educated on risks of long term use.  Mag level at physical.  Return in 6 months.

## 2020-12-09 NOTE — Assessment & Plan Note (Signed)
Chronic, stable.  Denies SI/HI.  Will continue current medication regimen at this time and adjust as needed.  May consider switch from Cymbalta to Sertraline in future if exacerbation or mood changes.  Return in 6 months or sooner if worsening.

## 2020-12-09 NOTE — Patient Instructions (Addendum)
You are schedule for bone density at [facility] on 01/04/21 at 2:20 PM  Look into cost of Shingrix with Medicare  Bone Density Test A bone density test uses a type of X-ray to measure the amount of calcium and other minerals in a person's bones. It can measure bone density in the hip and the spine. The test is similar to having a regular X-ray. This test may also be called: Bone densitometry. Bone mineral density test. Dual-energy X-ray absorptiometry (DEXA). You may have this test to: Diagnose a condition that causes weak or thin bones (osteoporosis). Screen you for osteoporosis. Predict your risk for a broken bone (fracture). Determine how well your osteoporosis treatment is working. Tell a health care provider about: Any allergies you have. All medicines you are taking, including vitamins, herbs, eye drops, creams, and over-the-counter medicines. Any problems you or family members have had with anesthetic medicines. Any blood disorders you have. Any surgeries you have had. Any medical conditions you have. Whether you are pregnant or may be pregnant. Any medical tests you have had within the past 14 days that used contrast material. What are the risks? Generally, this is a safe test. However, it does expose you to a small amount of radiation, which can slightly increase your cancer risk. What happens before the test? Do not take any calcium supplements within the 24 hours before your test. You will need to remove all metal jewelry, eyeglasses, removable dental appliances, and any other metal objects on your body. What happens during the test?  You will lie down on an exam table. There will be an X-ray generator below you and an imaging device above you. Other devices, such as boxes or braces, may be used to position your body properly for the scan. The machine will slowly scan your body. You will need to keep very still while the machine does the scan. The images will show up on a  screen in the room. Images will be examined by a specialist after your test is finished. The procedure may vary among health care providers and hospitals. What can I expect after the test? It is up to you to get the results of your test. Ask your health care provider, or the department that is doing the test, when your results will be ready. Summary A bone density test is an imaging test that uses a type of X-ray to measure the amount of calcium and other minerals in your bones. The test may be used to diagnose or screen you for a condition that causes weak or thin bones (osteoporosis), predict your risk for a broken bone (fracture), or determine how well your osteoporosis treatment is working. Do not take any calcium supplements within 24 hours before your test. Ask your health care provider, or the department that is doing the test, when your results will be ready. This information is not intended to replace advice given to you by your health care provider. Make sure you discuss any questions you have with your health care provider. Document Revised: 07/16/2019 Document Reviewed: 07/16/2019 Elsevier Patient Education  [place].

## 2020-12-10 LAB — LIPID PANEL W/O CHOL/HDL RATIO
Cholesterol, Total: 221 mg/dL — ABNORMAL HIGH (ref 100–199)
HDL: 41 mg/dL (ref >39)
LDL Chol Calc (NIH): 96 mg/dL (ref 0–99)
Triglycerides: 504 mg/dL — ABNORMAL HIGH (ref 0–149)
VLDL Cholesterol Cal: 84 mg/dL — ABNORMAL HIGH (ref 5–40)

## 2020-12-10 LAB — COMPREHENSIVE METABOLIC PANEL
ALT: 12 IU/L (ref 0–32)
AST: 15 IU/L (ref 0–40)
Albumin/Globulin Ratio: 1.8 (ref 1.2–2.2)
Albumin: 4.6 g/dL (ref 3.8–4.8)
Alkaline Phosphatase: 109 IU/L (ref 44–121)
BUN/Creatinine Ratio: 20 (ref 12–28)
BUN: 15 mg/dL (ref 8–27)
Bilirubin Total: 0.3 mg/dL (ref 0.0–1.2)
CO2: 25 mmol/L (ref 20–29)
Calcium: 10 mg/dL (ref 8.7–10.3)
Chloride: 104 mmol/L (ref 96–106)
Creatinine, Ser: 0.75 mg/dL (ref 0.57–1.00)
Globulin, Total: 2.5 g/dL (ref 1.5–4.5)
Glucose: 98 mg/dL (ref 70–99)
Potassium: 4.3 mmol/L (ref 3.5–5.2)
Sodium: 141 mmol/L (ref 134–144)
Total Protein: 7.1 g/dL (ref 6.0–8.5)
eGFR: 86 mL/min/{1.73_m2} (ref >59)

## 2020-12-11 NOTE — Progress Notes (Signed)
Contacted via [place] morning Romie Minus, your labs have returned: - Kidney function, creatinine and eGFR, and liver function, AST and ALT, are normal.  - Cholesterol levels remain elevated -- I highly recommend you start the Crestor I sent in.  Triglycerides were quite elevated, which could be related to not fasting too.  We will recheck all of this at your next visit.  If any issues with medication please let me know. Keep being awesome!!  Thank you for allowing me to participate in your care.  I appreciate you. Kindest regards, Dayvin Aber

## 2020-12-12 DIAGNOSIS — M810 Age-related osteoporosis without current pathological fracture: Secondary | ICD-10-CM

## 2020-12-12 DIAGNOSIS — E781 Pure hyperglyceridemia: Secondary | ICD-10-CM

## 2020-12-12 DIAGNOSIS — F332 Major depressive disorder, recurrent severe without psychotic features: Secondary | ICD-10-CM

## 2020-12-14 ENCOUNTER — Telehealth: Payer: Self-pay

## 2020-12-14 NOTE — Telephone Encounter (Signed)
   Telephone encounter was:  Unsuccessful.  12/14/2020 Name: Marie Perry MRN: [id number] DOB: 1950-11-27  Unsuccessful outbound call made today to assist with:  Food Insecurity  Outreach Attempt:  1st Attempt  A HIPAA compliant voice message was left requesting a return call.  Instructed patient to call back at [phone].  Cabrina Shiroma, AAS Paralegal, [facility]  [address], [place] [address] ??[email or web address]  ?? [phone]   [email or web address]

## 2020-12-22 DIAGNOSIS — Z20822 Contact with and (suspected) exposure to covid-19: Secondary | ICD-10-CM | POA: Diagnosis not present

## 2020-12-26 DIAGNOSIS — H2513 Age-related nuclear cataract, bilateral: Secondary | ICD-10-CM | POA: Diagnosis not present

## 2020-12-26 DIAGNOSIS — H2589 Other age-related cataract: Secondary | ICD-10-CM | POA: Diagnosis not present

## 2020-12-26 DIAGNOSIS — H35033 Hypertensive retinopathy, bilateral: Secondary | ICD-10-CM | POA: Diagnosis not present

## 2020-12-26 DIAGNOSIS — H25013 Cortical age-related cataract, bilateral: Secondary | ICD-10-CM | POA: Diagnosis not present

## 2020-12-28 ENCOUNTER — Ambulatory Visit
Admission: RE | Admit: 2020-12-28 | Discharge: 2020-12-28 | Disposition: A | Discharge status: Home / Self Care | Payer: Medicare Other | Source: Ambulatory Visit | Attending: Nurse Practitioner | Admitting: Nurse Practitioner

## 2020-12-28 ENCOUNTER — Telehealth: Payer: Self-pay

## 2020-12-28 ENCOUNTER — Other Ambulatory Visit: Payer: Self-pay

## 2020-12-28 DIAGNOSIS — Z1231 Encounter for screening mammogram for malignant neoplasm of breast: Secondary | ICD-10-CM | POA: Diagnosis not present

## 2020-12-28 NOTE — Telephone Encounter (Signed)
   Telephone encounter was:  Successful.  12/28/2020 Name: Marie Perry MRN: [id number] DOB: 1950-05-21  Marie Perry is a 70 y.o. year old female who is a primary care patient of Cannady, Barbaraann Faster, NP . The community resource team was consulted for assistance with [place] guide performed the following interventions: Spoke with patient about food pantry list verified email and sent list.  Patient stated that she had been told we could provide [employer] gift cards.  Submitted request for funds/gift cards to [facility].  Follow Up Plan:  Care guide will follow up with patient by phone over the next 8-59 days  Fayette Gasner, AAS Paralegal, [facility]  [address], [place] [address] ??[email or web address]  ?? [phone]   [email or web address]

## 2020-12-28 NOTE — Progress Notes (Signed)
Contacted via MyChart   Good afternoon, mammogram has returned normal, repeat in one year:)

## 2020-12-29 ENCOUNTER — Telehealth: Payer: Self-pay

## 2020-12-29 NOTE — Telephone Encounter (Signed)
   Telephone encounter was:  Successful.  12/29/2020 Name: Hanifah Royse MRN: [id number] DOB: 09-17-1950  Villa Burgin is a 70 y.o. year old female who is a primary care patient of Cannady, Barbaraann Faster, NP . The community resource team was consulted for assistance with [place] guide performed the following interventions: Left message on voicemail to let patient know to check with the practice in a few days for her [place] gift cards $150.  Follow Up Plan:  No further follow up planned at this time. The patient has been provided with needed resources.  Avaleen Brownley, AAS Paralegal, [facility]  [address], [place] [address] ??[email or web address]  ?? [phone]   [email or web address]

## 2021-01-02 ENCOUNTER — Telehealth: Payer: Self-pay | Admitting: Nurse Practitioner

## 2021-01-02 NOTE — Telephone Encounter (Signed)
Pt received an Assistance card from [employer] ( $150) pat has been inform and will send daughter to pick it up.01/02/2021

## 2021-01-04 ENCOUNTER — Other Ambulatory Visit: Payer: Self-pay

## 2021-01-04 ENCOUNTER — Ambulatory Visit
Admission: RE | Admit: 2021-01-04 | Discharge: 2021-01-04 | Disposition: A | Discharge status: Home / Self Care | Payer: Medicare Other | Source: Ambulatory Visit | Attending: Nurse Practitioner | Admitting: Nurse Practitioner

## 2021-01-04 DIAGNOSIS — Z780 Asymptomatic menopausal state: Secondary | ICD-10-CM | POA: Diagnosis not present

## 2021-01-04 DIAGNOSIS — M810 Age-related osteoporosis without current pathological fracture: Secondary | ICD-10-CM | POA: Diagnosis not present

## 2021-01-04 DIAGNOSIS — M85852 Other specified disorders of bone density and structure, left thigh: Secondary | ICD-10-CM | POA: Diagnosis not present

## 2021-01-04 NOTE — Progress Notes (Signed)
Contacted via MyChart please call and ensure she received below message: Good dayJean, your bone imaging has returned and does show ongoing osteoporosis with slight change from -2.9 to now -3.0.  I would recommend we initiate Prolia as we discussed at recent visit.  I would recommend scheduling office visit the upcoming weeks to further discuss and we can talk about how to inject.  Any questions? Keep being amazing!!  Thank you for allowing me to participate in your care.  I appreciate you. Kindest regards, Ludmila Ebarb

## 2021-01-23 ENCOUNTER — Other Ambulatory Visit: Payer: Self-pay

## 2021-01-23 ENCOUNTER — Ambulatory Visit (INDEPENDENT_AMBULATORY_CARE_PROVIDER_SITE_OTHER): Payer: Medicare Other | Admitting: Nurse Practitioner

## 2021-01-23 ENCOUNTER — Encounter: Payer: Self-pay | Admitting: Nurse Practitioner

## 2021-01-23 DIAGNOSIS — M810 Age-related osteoporosis without current pathological fracture: Secondary | ICD-10-CM

## 2021-01-23 NOTE — Assessment & Plan Note (Signed)
Chronic, ongoing with poor tolerance to bisphosphonate in past.  No recent falls or fractures.  Discussed at length treatment options and benefits/risks.  Could consider Prolia, which was discussed with her and provided her with education on this. At this time she prefers to avoid treatment and repeat DEXA next year.  Consider referral endocrinology to discuss infusions, which she would prefer if treatment needed.  She understands risk for fractures.  Check PTH today.

## 2021-01-23 NOTE — Patient Instructions (Signed)
Osteoporosis Osteoporosis is when the bones get thin and weak. This can cause your bones to break (fracture) more easily. What are the causes? The exact cause of this condition is not known. What increases the risk? Having family members with this condition. Not eating enough healthy foods. Taking certain medicines. Being female. Being age 69 or older. Smoking or using other products that contain nicotine or tobacco, such as e-cigarettes or chewing tobacco. Not exercising. Being of European or Asian ancestry. Having a small body frame. What are the signs or symptoms? A broken bone might be the first sign, especially if the break results from a fall or injury that usually would not cause a bone to break. Other signs and symptoms include: Pain in the neck or low back. Being hunched over (stooped posture). Getting shorter. How is this treated? Eating more foods with more calcium and vitamin D in them. Doing exercises. Stopping tobacco use. Limiting how much alcohol you drink. Taking medicines to slow bone loss or help make the bones stronger. Taking supplements of calcium and vitamin D every day. Taking medicines to replace chemicals in the body (hormone replacement medicines). Monitoring your levels of calcium and vitamin D. The goal of treatment is to strengthen your bones and lower your risk for a bone break. Follow these instructions at home: Eating and drinking Eat plenty of calcium and vitamin D. These nutrients are good for your bones. Good sources of calcium and vitamin D include: Some fish, such as salmon and tuna. Foods that have calcium and vitamin D added to them (fortified foods), such as some breakfast cereals. Egg yolks. Cheese. Liver.  Activity Do exercises as told by your doctor. Ask your doctor what exercises are safe for you. You should do: Exercises that make your muscles work to hold your body weight up (weight-bearing exercises). These include tai chi,  yoga, and walking. Exercises to make your muscles stronger. One example is lifting weights. Lifestyle Do not drink alcohol if: Your doctor tells you not to drink. You are pregnant, may be pregnant, or are planning to become pregnant. If you drink alcohol: Limit how much you use to: 0-1 drink a day for women. 0-2 drinks a day for men. Know how much alcohol is in your drink. In the [place] one drink equals one 12 oz bottle of beer (355 mL), one 5 oz glass of wine (148 mL), or one 1 oz glass of hard liquor (44 mL). Do not smoke or use any products that contain nicotine or tobacco. If you need help quitting, ask your doctor. Preventing falls Use tools to help you move around (mobility aids) as needed. These include canes, walkers, scooters, and crutches. Keep rooms well-lit. Put away things on the floor that could make you trip. These include cords and rugs. Install safety rails on stairs. Install grab bars in bathrooms. Use rubber mats in slippery areas, like bathrooms. Wear shoes that: Fit you well. Support your feet. Have closed toes. Have rubber soles or low heels. Tell your doctor about all of the medicines you are taking. Some medicines can make you more likely to fall. General instructions Take over-the-counter and prescription medicines only as told by your doctor. Keep all follow-up visits. Contact a doctor if: You have not been tested (screened) for osteoporosis and you are: A woman who is age 65 or older. A man who is age 70 or older. Get help right away if: You fall. You get hurt. Summary Osteoporosis happens when your bones   get thin and weak. Weak bones can break (fracture) more easily. Eat plenty of calcium and vitamin D. These are good for your bones. Tell your doctor about all of the medicines that you take. This information is not intended to replace advice given to you by your health care provider. Make sure you discuss any questions you have with your health care  provider. Document Revised: 07/16/2019 Document Reviewed: 07/16/2019 Elsevier Patient Education  [place].

## 2021-01-23 NOTE — Progress Notes (Signed)
BP 102/68   Pulse 74   Temp 98.4 F (36.9 C)   Wt 191 lb 9.6 oz (86.9 kg)   LMP  (LMP Unknown)   SpO2 97%   BMI 30.46 kg/m    Subjective:    Patient ID: Marie Perry, female    DOB: 29-Apr-1950, 70 y.o.   MRN: [id number]  HPI: Marie Perry is a 70 y.o. female  Chief Complaint  Patient presents with   Medication Management    Patient is here to discuss Prolia medication with provider. Patient says she is here to discuss whether or not Prolia is the best option or if there are other options as patient states she is not a fan of taking medications that have a lot of side effects.    OSTEOPOROSIS Ongoing recent DEXA on 01/04/21 == The BMD measured at [facility] L1-L4 is 0.817 g/cm2 with a T-score of -3.0, previous was -2.9.  In past took Fosamax and this caused her stomach pain.  Her mother has parathyroid issues and osteoporosis.    We discussed treatment options at length today. Satisfied with current treatment?: yes Medication side effects: yes Past osteoporosis medications/treatments: Fosamax Adequate calcium & vitamin D: yes Intolerance to bisphosphonates:yes Weight bearing exercises: yes   Relevant past medical, surgical, family and social history reviewed and updated as indicated. Interim medical history since our last visit reviewed. Allergies and medications reviewed and updated.  Review of Systems  Constitutional:  Negative for activity change, appetite change, diaphoresis, fatigue and fever.  Respiratory:  Negative for cough, chest tightness and shortness of breath.   Cardiovascular:  Negative for chest pain, palpitations and leg swelling.  Gastrointestinal: Negative.   Neurological: Negative.   Psychiatric/Behavioral:  Negative for decreased concentration, self-injury, sleep disturbance and suicidal ideas.    Per HPI unless specifically indicated above     Objective:    BP 102/68   Pulse 74   Temp 98.4 F (36.9 C)   Wt 191 lb 9.6 oz (86.9  kg)   LMP  (LMP Unknown)   SpO2 97%   BMI 30.46 kg/m   Wt Readings from Last 3 Encounters:  01/23/21 191 lb 9.6 oz (86.9 kg)  12/09/20 190 lb 3.2 oz (86.3 kg)  06/06/20 176 lb 9.6 oz (80.1 kg)    Physical Exam Vitals and nursing note reviewed.  Constitutional:      Appearance: She is well-developed.  HENT:     Head: Normocephalic.  Eyes:     General:        Right eye: No discharge.        Left eye: No discharge.     Conjunctiva/sclera: Conjunctivae normal.     Pupils: Pupils are equal, round, and reactive to light.  Neck:     Thyroid: No thyromegaly.     Vascular: No carotid bruit or JVD.  Cardiovascular:     Rate and Rhythm: Normal rate and regular rhythm.     Heart sounds: Normal heart sounds.  Pulmonary:     Effort: Pulmonary effort is normal.     Breath sounds: Normal breath sounds.  Abdominal:     General: Bowel sounds are normal.     Palpations: Abdomen is soft.  Musculoskeletal:     Cervical back: Normal range of motion and neck supple.  Lymphadenopathy:     Cervical: No cervical adenopathy.  Skin:    General: Skin is warm and dry.  Neurological:     Mental Status: She is  alert and oriented to person, place, and time.  Psychiatric:        Attention and Perception: Attention normal.        Mood and Affect: Mood normal.        Speech: Speech normal.        Behavior: Behavior normal.        Thought Content: Thought content normal.    Results for orders placed or performed in visit on 12/09/20  Comprehensive metabolic panel  Result Value Ref Range   Glucose 98 70 - 99 mg/dL   BUN 15 8 - 27 mg/dL   Creatinine, Ser 0.75 0.57 - 1.00 mg/dL   eGFR 86 >59 mL/min/1.73   BUN/Creatinine Ratio 20 12 - 28   Sodium 141 134 - 144 mmol/L   Potassium 4.3 3.5 - 5.2 mmol/L   Chloride 104 96 - 106 mmol/L   CO2 25 20 - 29 mmol/L   Calcium 10.0 8.7 - 10.3 mg/dL   Total Protein 7.1 6.0 - 8.5 g/dL   Albumin 4.6 3.8 - 4.8 g/dL   Globulin, Total 2.5 1.5 - 4.5 g/dL    Albumin/Globulin Ratio 1.8 1.2 - 2.2   Bilirubin Total 0.3 0.0 - 1.2 mg/dL   Alkaline Phosphatase 109 44 - 121 IU/L   AST 15 0 - 40 IU/L   ALT 12 0 - 32 IU/L  Lipid Panel w/o Chol/HDL Ratio  Result Value Ref Range   Cholesterol, Total 221 (H) 100 - 199 mg/dL   Triglycerides 504 (H) 0 - 149 mg/dL   HDL 41 >39 mg/dL   VLDL Cholesterol Cal 84 (H) 5 - 40 mg/dL   LDL Chol Calc (NIH) 96 0 - 99 mg/dL      Assessment & Plan:   Problem List Items Addressed This Visit       Musculoskeletal and Integument   Osteoporosis    Chronic, ongoing with poor tolerance to bisphosphonate in past.  No recent falls or fractures.  Discussed at length treatment options and benefits/risks.  Could consider Prolia, which was discussed with her and provided her with education on this. At this time she prefers to avoid treatment and repeat DEXA next year.  Consider referral endocrinology to discuss infusions, which she would prefer if treatment needed.  She understands risk for fractures.  Check PTH today.      Relevant Orders   PTH, Intact and Calcium     Follow up plan: Return for as scheduled in May.

## 2021-01-24 LAB — PTH, INTACT AND CALCIUM
Calcium: 10.2 mg/dL (ref 8.7–10.3)
PTH: 46 pg/mL (ref 15–65)

## 2021-01-24 NOTE — Progress Notes (Signed)
Contacted via [place] afternoon Romie Minus, your parathyroid and calcium levels are normal.  Great news!! Keep being awesome!!  Thank you for allowing me to participate in your care.  I appreciate you. Kindest regards, Laguana Desautel

## 2021-01-27 ENCOUNTER — Telehealth: Payer: Medicare Other

## 2021-01-31 ENCOUNTER — Telehealth: Payer: Self-pay | Admitting: Licensed Clinical Social Worker

## 2021-01-31 NOTE — Telephone Encounter (Signed)
° °   Clinical Social Work  Care Management   Phone Outreach    01/31/2021 Name: Marie Perry MRN: [id number] DOB: Jan 20, 1951  Marie Perry is a 70 y.o. year old female who is a primary care patient of Cannady, Barbaraann Faster, NP .   Reason for referral: Mental Health Counseling and Resources.    F/U phone call today to assess needs, progress and barriers with care plan goals.   Telephone outreach was unsuccessful. A HIPPA compliant phone message was left for the patient providing contact information and requesting a return call.   Plan:CCM LCSW will wait for return call. If no return call is received, LCSW will make another attempt within 30 days  Review of patient status, including review of consultants reports, relevant laboratory and other test results, and collaboration with appropriate care team members and the patient's provider was performed as part of comprehensive patient evaluation and provision of care management services.    Christa See, MSW, [email or web address] Phone [phone] 11:14 AM

## 2021-02-03 ENCOUNTER — Ambulatory Visit (INDEPENDENT_AMBULATORY_CARE_PROVIDER_SITE_OTHER): Payer: Medicare Other | Admitting: Licensed Clinical Social Worker

## 2021-02-03 DIAGNOSIS — E782 Mixed hyperlipidemia: Secondary | ICD-10-CM

## 2021-02-03 DIAGNOSIS — F332 Major depressive disorder, recurrent severe without psychotic features: Secondary | ICD-10-CM

## 2021-02-03 DIAGNOSIS — M810 Age-related osteoporosis without current pathological fracture: Secondary | ICD-10-CM

## 2021-02-07 NOTE — Patient Instructions (Signed)
Visit Information  Thank you for taking time to visit with me today. Please don't hesitate to contact me if I can be of assistance to you before our next scheduled telephone appointment.  Following are the goals we discussed today:  [name] Self Care Activities:  Call provider office for new concerns or questions Attend all scheduled provider appointments Utilize healthy coping skills discussed Follow up with [facility] and Care guide regarding supportive resources  Our next appointment is by telephone on 04/10 at 2:15 PM  Please call the care guide team at [phone] if you need to cancel or reschedule your appointment.   If you are experiencing a Mental Health or [place] or need someone to talk to, please call 911   Patient verbalizes understanding of instructions provided today and agrees to view in [place].   Christa See, MSW, [email or web address] Phone [phone] 10:03 AM

## 2021-02-07 NOTE — Chronic Care Management (AMB) (Signed)
Chronic Care Management    Clinical Social Work Note  02/07/2021 Name: Marie Perry MRN: [id number] DOB: 01/07/1951  Marie Perry is a 70 y.o. year old female who is a primary care patient of Cannady, Barbaraann Faster, NP. The CCM team was consulted to assist the patient with chronic disease management and/or care coordination needs related to: Mental Health Counseling and Resources and Caregiver Stress.   Engaged with patient by telephone for follow up visit in response to provider referral for social work chronic care management and care coordination services.   Consent to Services:  The patient was given information about Chronic Care Management services, agreed to services, and gave verbal consent prior to initiation of services.  Please see initial visit note for detailed documentation.   Patient agreed to services and consent obtained.   Consent to Services:  The patient was given information about Care Management services, agreed to services, and gave verbal consent prior to initiation of services.  Please see initial visit note for detailed documentation.   Patient agreed to services today and consent obtained.  Engaged with patient by phone in response to provider referral for social work care coordination services:  Assessment/Interventions:  Patient continues to maintain positive progress with care plan goals..  See Care Plan below for interventions and patient self-care activities.  Recent life changes or stressors: Management of health conditions  Recommendation: Patient may benefit from, and is in agreement work with LCSW to address care coordination needs and will continue to work with the clinical team to address health care and disease management related needs.   Follow up Plan: Patient would like continued follow-up from CCM LCSW.  per patient's request will follow up in 12 weeks.  Will call office if needed prior to next encounter.     SDOH (Social  Determinants of Health) assessments and interventions performed:    Advanced Directives Status: Not addressed in this encounter.  CCM Care Plan  No Known Allergies  Outpatient Encounter Medications as of 02/03/2021  Medication Sig   busPIRone (BUSPAR) 10 MG tablet Take 1 tablet (10 mg total) by mouth 2 (two) times daily.   Cholecalciferol (VITAMIN D PO) Take 2,000 Units by mouth daily.    Cyanocobalamin (B-12 PO) Take 1,000 mcg by mouth daily.    DULoxetine (CYMBALTA) 60 MG capsule Take 1 capsule (60 mg total) by mouth daily.   omeprazole (PRILOSEC) 20 MG capsule Take 1 capsule (20 mg total) by mouth daily.   rosuvastatin (CRESTOR) 10 MG tablet Take 1 tablet (10 mg total) by mouth daily.   No facility-administered encounter medications on file as of 02/03/2021.    Patient Active Problem List   Diagnosis Date Noted   Mixed hyperlipidemia 12/09/2020   Vitamin B12 deficiency 02/27/2018   Osteoporosis 04/15/2017   Advanced care planning/counseling discussion 02/22/2017   Hx of colonic polyps 04/13/2016   IBS (irritable bowel syndrome) 04/29/2015   Allergic rhinitis 04/29/2015   GERD (gastroesophageal reflux disease) 04/29/2015   Recurrent major depression-severe ([place]) 04/29/2015   Vitamin D deficiency 04/29/2015   Sleep apnea 04/29/2015    Conditions to be addressed/monitored: Depression and Osteoporosis; Caregiver Stress  Care Plan : General Social Work (Adult)  Updates made by Rebekah Chesterfield, LCSW since 02/07/2021 12:00 AM     Problem: Response to Treatment (Depression)      Long-Range Goal: Response to Treatment Maximized   Start Date: 04/08/2020  Expected End Date: 05/02/2020  This Visit's Progress: On track  Recent Progress: On track  Priority: Medium  Note:   Current Barriers:  Knowledge Deficits related to long term strategies for management of depression and caregiver stress Lacks caregiver support. 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  Social  Work Clinical Goal(s):  Over the next 90 days, patient will verbalize basic understanding of depression/stress process and self health management plan as evidenced by her participation in development of long term plan of care and institution of self health management strategies Over the next 90 days, patient will be educated on healthy coping skills and will implement these appropriate self care methods into her daily routine to combat depression/anxiety. Over the next 90 days, patient will be educated on appropriate mental health resources that are available to her as well as socialization opportunities.  Interventions:  Patient interviewed and appropriate assessments performed Patient is currently on medication for depression management and reports that it is effectively working for her but she still has symptoms of depression at times. Depression management coping skill education provided to patient. 10/21: Patient continues to participate in med management, in addition, to taking supplements (B12, D3, and a multivitamin) Patient requests follow up from PCP regarding recent concerns that Buspar causes memory concerns 12/23: Patient addressed medication side effects with PCP at previous appt Patient reports difficulty managing depression and anxiety symptoms triggered by caregiver strain. [name] mother resides with her and was diagnosed with moderate Alzheimer's disease. Emotional support and coping skill education provided to patient 10/21: Patient reports that she is currently receiving assistance with caring for mother throughout the week. She has a paid aid, in addition, to a close family friend that pt's mother is comfortable with. [name] siblings also take turns visiting monthly. 12/23: Patient reports no psychosocial stressors or concerns. She will schedule follow up appt with PCP for April 2023 Patient reports financial strain due to having to make repairs to appliances in the home, in  addition, to her vehicle. She is employed part-time; however, pays for mother's daily needs out of pocket and shared food insecurity, at times. CCM LCSW completed a referral to Care Guide to assist with supportive resources and informed patient of free incontinence supplies through [employer] [name] mother is receiving Meals on Wheels. She is not interested in participating in day centers 12/23: Patient received food assistance, stating it "helped tremendously"  Patient was successful in identifying healthy coping skills to assist with stress management (prayer and going out once a week) Patient does not have much availability for support groups; however, enjoys reading articles regarding caregiving 12/23: Patient reports she is intentional about social distancing and staying indoors to maximize health for self and mother Provided mental health counseling with regard to depression/caregiver stress. Patient denies any SI/HI at this time Education provided on how to implement appropriate self-care coping tools to combat triggers and stress. Patient receptive to education and reports implementing appropriate self-care by reading and spending time outside LCSW provided reflective listening and implemented appropriate interventions to help suppport patient and her emotional needs  LCSW provided mental health resource education and reminded patient that PCP sent behavioral health referral for therapy at last PCP visit. Patient reports that she is not interested in therapy but will consider this resource if her symptoms continue. Patient does NOT want CCM LCSW to place new referral for [facility] at this time LCSW provided education on available socialization and senior opportunities within her nearby area Discussed plans with patient for ongoing care management follow up and  provided patient with direct contact information for care management team Evaluation of current treatment plan related to depression/caregiver  stress and patient's adherence to plan as established by provider [name] Self Care Activities:  Call provider office for new concerns or questions Attend all scheduled provider appointments Utilize healthy coping skills discussed Follow up with [facility] and Care guide regarding supportive resources        Christa See, MSW, [email or web address] Phone [phone] 10:02 AM

## 2021-02-11 DIAGNOSIS — M810 Age-related osteoporosis without current pathological fracture: Secondary | ICD-10-CM

## 2021-02-11 DIAGNOSIS — E785 Hyperlipidemia, unspecified: Secondary | ICD-10-CM

## 2021-02-11 DIAGNOSIS — F32A Depression, unspecified: Secondary | ICD-10-CM

## 2021-02-20 DIAGNOSIS — H2513 Age-related nuclear cataract, bilateral: Secondary | ICD-10-CM | POA: Diagnosis not present

## 2021-02-20 DIAGNOSIS — H25013 Cortical age-related cataract, bilateral: Secondary | ICD-10-CM | POA: Diagnosis not present

## 2021-02-20 DIAGNOSIS — H2589 Other age-related cataract: Secondary | ICD-10-CM | POA: Diagnosis not present

## 2021-02-20 DIAGNOSIS — H52213 Irregular astigmatism, bilateral: Secondary | ICD-10-CM | POA: Diagnosis not present

## 2021-02-20 DIAGNOSIS — H2512 Age-related nuclear cataract, left eye: Secondary | ICD-10-CM | POA: Diagnosis not present

## 2021-03-09 ENCOUNTER — Ambulatory Visit (INDEPENDENT_AMBULATORY_CARE_PROVIDER_SITE_OTHER): Payer: Medicare Other | Admitting: *Deleted

## 2021-03-09 DIAGNOSIS — Z0000 Encounter for general adult medical examination without abnormal findings: Secondary | ICD-10-CM

## 2021-03-09 NOTE — Progress Notes (Signed)
Subjective:   Marie Perry is a 71 y.o. female who presents for Medicare Annual (Subsequent) preventive examination.  I connected with  Melvin Marmo on 03/09/21 by a telephone enabled telemedicine application and verified that I am speaking with the correct person using two identifiers.   I discussed the limitations of evaluation and management by telemedicine. The patient expressed understanding and agreed to proceed.  Patient location: home  Provider location: Tele-Health not in office    Review of Systems     Cardiac Risk Factors include: advanced age (>26men, >36 women);hypertension;sedentary lifestyle     Objective:    Today's Vitals   There is no height or weight on file to calculate BMI.  Advanced Directives 03/09/2021 03/07/2020 02/27/2018 04/15/2017 02/22/2017 09/17/2016  Does Patient Have a Medical Advance Directive? Yes Yes Yes No No No  Type of Paramedic of [place];Living will [place];Living will - - -  Copy of Manuel Garcia in Chart? Yes - validated most recent copy scanned in chart (See row information) Yes - validated most recent copy scanned in chart (See row information) Yes - validated most recent copy scanned in chart (See row information) - - -  Would patient like information on creating a medical advance directive? - - - - Yes (MAU/Ambulatory/Procedural Areas - Information given) -    Current Medications (verified) Outpatient Encounter Medications as of 03/09/2021  Medication Sig   busPIRone (BUSPAR) 10 MG tablet Take 1 tablet (10 mg total) by mouth 2 (two) times daily.   Cholecalciferol (VITAMIN D PO) Take 2,000 Units by mouth daily.    Cyanocobalamin (B-12 PO) Take 1,000 mcg by mouth daily.    DULoxetine (CYMBALTA) 60 MG capsule Take 1 capsule (60 mg total) by mouth daily.   omeprazole (PRILOSEC) 20 MG capsule Take 1 capsule (20 mg total) by mouth daily.    rosuvastatin (CRESTOR) 10 MG tablet Take 1 tablet (10 mg total) by mouth daily.   No facility-administered encounter medications on file as of 03/09/2021.    Allergies (verified) Patient has no known allergies.   History: Past Medical History:  Diagnosis Date   Allergy    Anxiety    Arthritis    Cataract 02/2016   Yearly eye exam w/Dr. 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71, had "brights disease"   Depression    GERD (gastroesophageal reflux disease)    Hypertriglyceridemia    IBS (irritable bowel syndrome)    IFG (impaired fasting glucose)    Osteopenia    Sleep apnea    Vitamin D deficiency    Past Surgical History:  Procedure Laterality Date   bright's surgery     COLONOSCOPY N/A 09/17/2016   Procedure: COLONOSCOPY;  Surgeon: Lollie Sails, MD;  Location[facility] ENDOSCOPY;  Service: Endoscopy;  Laterality: N/A;   COLONOSCOPY WITH PROPOFOL N/A 04/15/2017   Procedure: COLONOSCOPY WITH PROPOFOL;  Surgeon: Lollie Sails, MD;  Location: [facility] ENDOSCOPY;  Service: Endoscopy;  Laterality: N/A;   SKIN TAG REMOVAL     rectal   Family History  Problem Relation Age of Onset   Mental illness Mother    Depression Mother    Anxiety disorder Mother    Hearing loss Mother    Miscarriages / [place] Mother    Vision loss Mother    Dementia Mother    Parkinson's disease Mother    Stroke Father  Heart disease Father    Hearing loss Father    Vision loss Father    Cancer Maternal Grandmother        ovarian   Anemia Maternal Grandmother    Arthritis Maternal Grandmother    Lung disease Maternal Grandfather    Birth defects Maternal Grandfather    Asthma Maternal Grandfather    Depression Sister    Fibromyalgia Brother    GER disease Brother    Heart disease Paternal Grandmother    Birth defects Paternal Grandfather    Depression Brother    Asthma Paternal Uncle    Birth defects Sister    Early death Sister    Cancer Maternal Uncle    Hearing loss  Maternal Uncle    Kidney disease Maternal Uncle    Depression Brother    Early death Paternal Uncle    Hearing loss Maternal Aunt    Vision loss Maternal Aunt    Varicose Veins Maternal Aunt    Heart disease Paternal Uncle    Stroke Paternal Uncle    Breast cancer Neg Hx    Social History   Socioeconomic History   Marital status: Single    Spouse name: Not on file   Number of children: 0   Years of education: Not on file   Highest education level: Associate degree: academic program  Occupational History   Occupation: retired    Comment: works part time  Tobacco Use   Smoking status: Never   Smokeless tobacco: Never  Vaping Use   Vaping Use: Never used  Substance and Sexual Activity   Alcohol use: Yes    Alcohol/week: 0.0 standard drinks    Comment: rarely indulge   Drug use: Never   Sexual activity: Never  Other Topics Concern   Not on file  Social History Narrative   Not on file   Social Determinants of Health   Financial Resource Strain: Low Risk    Difficulty of Paying Living Expenses: Not hard at all  Food Insecurity: No Food Insecurity   Worried About Charity fundraiser in the Last Year: Never true   Bartholomew in the Last Year: Never true  Transportation Needs: No Transportation Needs   Lack of Transportation (Medical): No   Lack of Transportation (Non-Medical): No  Physical Activity: Inactive   Days of Exercise per Week: 0 days   Minutes of Exercise per Session: 0 min  Stress: Stress Concern Present   Feeling of Stress : To some extent  Social Connections: Unknown   Frequency of Communication with Friends and Family: More than three times a week   Frequency of Social Gatherings with Friends and Family: More than three times a week   Attends Religious Services: More than 4 times per year   Active Member of [employer] or Organizations: No   Attends Music therapist: Never   Marital Status: Not on file    Tobacco Counseling Counseling  given: Not Answered   Clinical Intake:  Pre-visit preparation completed: Yes  Pain : No/denies pain     Nutritional Risks: None Diabetes: No  How often do you need to have someone help you when you read instructions, pamphlets, or other written materials from your doctor or pharmacy?: 1 - Never  Diabetic no  Interpreter Needed?: No  Information entered by :: Leroy Kennedy LPN   Activities of Daily Living In your present state of health, do you have any difficulty performing the following activities: 03/09/2021  Hearing? N  Vision? N  Difficulty concentrating or making decisions? N  Walking or climbing stairs? N  Dressing or bathing? N  Doing errands, shopping? N  Preparing Food and eating ? N  Using the Toilet? N  In the past six months, have you accidently leaked urine? Y  Comment sometimes when sneezes  Do you have problems with loss of bowel control? N  Managing your Medications? N  Managing your Finances? N  Housekeeping or managing your Housekeeping? N  Some recent data might be hidden    Patient Care Team: Venita Lick, NP as PCP - General (Nurse Practitioner) Rebekah Chesterfield, LCSW as Social Worker (Licensed Clinical Social Worker)  Indicate any recent [place] you may have received from other than [facility] providers in the past year (date may be approximate).     Assessment:   This is a routine wellness examination for Marie Perry.  Hearing/Vision screen Hearing Screening - Comments:: No trouble hearing Vision Screening - Comments:: Ellin Mayhew Up to date Having cataract surgery  Dietary issues and exercise activities discussed: Current Exercise Habits: The patient does not participate in regular exercise at present, Exercise limited by: None identified   Goals Addressed             This Visit's Progress    Weight (lb) < 200 lb (90.7 kg)         Depression Screen PHQ 2/9 Scores 03/09/2021 12/09/2020 06/06/2020 03/07/2020 11/23/2019 05/08/2019  03/05/2019  PHQ - 2 Score 2 2 5 1 1 2 1   PHQ- 9 Score 7 11 11 3 6 7  -    Fall Risk Fall Risk  03/09/2021 03/07/2020 05/08/2019 03/05/2019 06/30/2018  Falls in the past year? 0 0 0 0 1  Number falls in past yr: 0 - 0 0 0  Comment - - - - -  Injury with Fall? 0 - 0 0 1  Comment - - - - -  Risk for fall due to : - Medication side effect - - -  Follow up Falls evaluation completed;Falls prevention discussed Falls evaluation completed;Education provided;Falls prevention discussed Falls evaluation completed - -    FALL RISK PREVENTION PERTAINING TO THE HOME:  Any stairs in or around the home? No  If so, are there any without handrails? No  Home free of loose throw rugs in walkways, pet beds, electrical cords, etc? Yes  Adequate lighting in your home to reduce risk of falls? Yes   ASSISTIVE DEVICES UTILIZED TO PREVENT FALLS:  Life alert? No  Use of a cane, walker or w/c? No  Grab bars in the bathroom? Yes  Shower chair or bench in shower? No  Elevated toilet seat or a handicapped toilet? No   TIMED UP AND GO:  Was the test performed? No .    Cognitive Function:  Normal cognitive status assessed by direct observation by this Nurse Health Advisor. No abnormalities found.       6CIT Screen 03/07/2020 02/27/2018 02/22/2017  What Year? 0 points 0 points 0 points  What month? 0 points 0 points 0 points  What time? 0 points 0 points 0 points  Count back from 20 0 points 0 points 0 points  Months in reverse 2 points 0 points 0 points  Repeat phrase 4 points 0 points 0 points  Total Score 6 0 0    Immunizations Immunization History  Administered Date(s) Administered   Influenza Inj Mdck Quad Pf 11/28/2017   Influenza, High Dose Seasonal PF  11/09/2016, 11/23/2020   Influenza-Unspecified 11/22/2014, 12/12/2015, 01/07/2017, 11/28/2017, 12/16/2018   PFIZER(Purple Top)SARS-COV-2 Vaccination 04/01/2019, 04/22/2019   Pfizer Covid-19 Vaccine Bivalent Booster 54yrs & up 11/03/2020    Pneumococcal Conjugate-13 02/22/2017   Pneumococcal Polysaccharide-23 02/27/2018   Tdap 10/07/2015   Zoster, Live 03/06/2013    TDAP status: Up to date  Flu Vaccine status: Up to date  Pneumococcal vaccine status: Up to date  Covid-19 vaccine status: Information provided on how to obtain vaccines.   Qualifies for Shingles Vaccine? Yes   Zostavax completed Yes   Shingrix Completed?: No.    Education has been provided regarding the importance of this vaccine. Patient has been advised to call insurance company to determine out of pocket expense if they have not yet received this vaccine. Advised may also receive vaccine at local pharmacy or Health Dept. Verbalized acceptance and understanding.  Screening Tests Health Maintenance  Topic Date Due   Zoster Vaccines- Shingrix (1 of 2) 03/11/2021 (Originally 03/07/2001)   COLONOSCOPY (Pts 45-57yrs Insurance coverage will need to be confirmed)  06/06/2021 (Originally 04/15/2020)   MAMMOGRAM  12/29/2022   TETANUS/TDAP  10/06/2025   Pneumonia Vaccine 48+ Years old  Completed   INFLUENZA VACCINE  Completed   DEXA SCAN  Completed   COVID-19 Vaccine  Completed   Hepatitis C Screening  Completed   HPV VACCINES  Aged Out    Health Maintenance  There are no preventive care reminders to display for this patient.  Colorectal cancer screening: Type of screening: Colonoscopy. Completed 2019. Repeat every 3 years  Mammogram status: Completed  . Repeat every year  Bone Density status: Completed 2022. Results reflect: Bone density results: OSTEOPOROSIS. Repeat every 2 years.  Lung Cancer Screening: (Low Dose CT Chest recommended if Age 42-80 years, 30 pack-year currently smoking OR have quit w/in 15years.) does not qualify.   Lung Cancer Screening Referral:   Additional Screening:  Hepatitis C Screening: does not qualify; Completed 2019  Vision Screening: Recommended annual ophthalmology exams for early detection of glaucoma and other  disorders of the eye. Is the patient up to date with their annual eye exam?  Yes  Who is the provider or what is the name of the office in which the patient attends annual eye exams? [facility] If pt is not established with a provider, would they like to be referred to a provider to establish care? No .   Dental Screening: Recommended annual dental exams for proper oral hygiene  Community Resource Referral / Chronic Care Management: CRR required this visit?  No   CCM required this visit?  No      Plan:     I have personally reviewed and noted the following in the patients chart:   Medical and social history Use of alcohol, tobacco or illicit drugs  Current medications and supplements including opioid prescriptions.  Functional ability and status Nutritional status Physical activity Advanced directives List of other physicians Hospitalizations, surgeries, and ER visits in previous 12 months Vitals Screenings to include cognitive, depression, and falls Referrals and appointments  In addition, I have reviewed and discussed with patient certain preventive protocols, quality metrics, and best practice recommendations. A written personalized care plan for preventive services as well as general preventive health recommendations were provided to patient.     Leroy Kennedy, LPN   [id number]   Nurse Notes:   patient will research some doctors for her Colonoscopy will call when ready to put in referral

## 2021-03-09 NOTE — Patient Instructions (Signed)
Marie Perry , Thank you for taking time to come for your Medicare Wellness Visit. I appreciate your ongoing commitment to your health goals. Please review the following plan we discussed and let me know if I can assist you in the future.   Screening recommendations/referrals: Colonoscopy: up to date due 04-2021  Mammogram: up to date Bone Density: up to date Recommended yearly ophthalmology/optometry visit for glaucoma screening and checkup Recommended yearly dental visit for hygiene and checkup  Vaccinations: Influenza vaccine: up to date Pneumococcal vaccine: up to date Tdap vaccine: up to date Shingles vaccine: Education provided    Advanced directives: on file  Conditions/risks identified:   Next appointment: 06-12-2021 @ 9:20  [place]   Preventive Care 70 Years and Older, Female Preventive care refers to lifestyle choices and visits with your health care provider that can promote health and wellness. What does preventive care include? A yearly physical exam. This is also called an annual well check. Dental exams once or twice a year. Routine eye exams. Ask your health care provider how often you should have your eyes checked. Personal lifestyle choices, including: Daily care of your teeth and gums. Regular physical activity. Eating a healthy diet. Avoiding tobacco and drug use. Limiting alcohol use. Practicing safe sex. Taking low-dose aspirin every day. Taking vitamin and mineral supplements as recommended by your health care provider. What happens during an annual well check? The services and screenings done by your health care provider during your annual well check will depend on your age, overall health, lifestyle risk factors, and family history of disease. Counseling  Your health care provider may ask you questions about your: Alcohol use. Tobacco use. Drug use. Emotional well-being. Home and relationship well-being. Sexual activity. Eating habits. History of  falls. Memory and ability to understand (cognition). Work and work Statistician. Reproductive health. Screening  You may have the following tests or measurements: Height, weight, and BMI. Blood pressure. Lipid and cholesterol levels. These may be checked every 5 years, or more frequently if you are over 62 years old. Skin check. Lung cancer screening. You may have this screening every year starting at age 64 if you have a 30-pack-year history of smoking and currently smoke or have quit within the past 15 years. Fecal occult blood test (FOBT) of the stool. You may have this test every year starting at age 27. Flexible sigmoidoscopy or colonoscopy. You may have a sigmoidoscopy every 5 years or a colonoscopy every 10 years starting at age 21. Hepatitis C blood test. Hepatitis B blood test. Sexually transmitted disease (STD) testing. Diabetes screening. This is done by checking your blood sugar (glucose) after you have not eaten for a while (fasting). You may have this done every 1-3 years. Bone density scan. This is done to screen for osteoporosis. You may have this done starting at age 51. Mammogram. This may be done every 1-2 years. Talk to your health care provider about how often you should have regular mammograms. Talk with your health care provider about your test results, treatment options, and if necessary, the need for more tests. Vaccines  Your health care provider may recommend certain vaccines, such as: Influenza vaccine. This is recommended every year. Tetanus, diphtheria, and acellular pertussis (Tdap, Td) vaccine. You may need a Td booster every 10 years. Zoster vaccine. You may need this after age 62. Pneumococcal 13-valent conjugate (PCV13) vaccine. One dose is recommended after age 57. Pneumococcal polysaccharide (PPSV23) vaccine. One dose is recommended after age 28. Talk to  your health care provider about which screenings and vaccines you need and how often you need  them. This information is not intended to replace advice given to you by your health care provider. Make sure you discuss any questions you have with your health care provider. Document Released: 02/25/2015 Document Revised: 10/19/2015 Document Reviewed: 11/30/2014 Elsevier Interactive Patient Education  2017 [employer] Prevention in the Home Falls can cause injuries. They can happen to people of all ages. There are many things you can do to make your home safe and to help prevent falls. What can I do on the outside of my home? Regularly fix the edges of walkways and driveways and fix any cracks. Remove anything that might make you trip as you walk through a door, such as a raised step or threshold. Trim any bushes or trees on the path to your home. Use bright outdoor lighting. Clear any walking paths of anything that might make someone trip, such as rocks or tools. Regularly check to see if handrails are loose or broken. Make sure that both sides of any steps have handrails. Any raised decks and porches should have guardrails on the edges. Have any leaves, snow, or ice cleared regularly. Use sand or salt on walking paths during winter. Clean up any spills in your garage right away. This includes oil or grease spills. What can I do in the bathroom? Use night lights. Install grab bars by the toilet and in the tub and shower. Do not use towel bars as grab bars. Use non-skid mats or decals in the tub or shower. If you need to sit down in the shower, use a plastic, non-slip stool. Keep the floor dry. Clean up any water that spills on the floor as soon as it happens. Remove soap buildup in the tub or shower regularly. Attach bath mats securely with double-sided non-slip rug tape. Do not have throw rugs and other things on the floor that can make you trip. What can I do in the bedroom? Use night lights. Make sure that you have a light by your bed that is easy to reach. Do not use  any sheets or blankets that are too big for your bed. They should not hang down onto the floor. Have a firm chair that has side arms. You can use this for support while you get dressed. Do not have throw rugs and other things on the floor that can make you trip. What can I do in the kitchen? Clean up any spills right away. Avoid walking on wet floors. Keep items that you use a lot in easy-to-reach places. If you need to reach something above you, use a strong step stool that has a grab bar. Keep electrical cords out of the way. Do not use floor polish or wax that makes floors slippery. If you must use wax, use non-skid floor wax. Do not have throw rugs and other things on the floor that can make you trip. What can I do with my stairs? Do not leave any items on the stairs. Make sure that there are handrails on both sides of the stairs and use them. Fix handrails that are broken or loose. Make sure that handrails are as long as the stairways. Check any carpeting to make sure that it is firmly attached to the stairs. Fix any carpet that is loose or worn. Avoid having throw rugs at the top or bottom of the stairs. If you do have throw rugs, attach  them to the floor with carpet tape. Make sure that you have a light switch at the top of the stairs and the bottom of the stairs. If you do not have them, ask someone to add them for you. What else can I do to help prevent falls? Wear shoes that: Do not have high heels. Have rubber bottoms. Are comfortable and fit you well. Are closed at the toe. Do not wear sandals. If you use a stepladder: Make sure that it is fully opened. Do not climb a closed stepladder. Make sure that both sides of the stepladder are locked into place. Ask someone to hold it for you, if possible. Clearly mark and make sure that you can see: Any grab bars or handrails. First and last steps. Where the edge of each step is. Use tools that help you move around (mobility aids)  if they are needed. These include: Canes. Walkers. Scooters. Crutches. Turn on the lights when you go into a dark area. Replace any light bulbs as soon as they burn out. Set up your furniture so you have a clear path. Avoid moving your furniture around. If any of your floors are uneven, fix them. If there are any pets around you, be aware of where they are. Review your medicines with your doctor. Some medicines can make you feel dizzy. This can increase your chance of falling. Ask your doctor what other things that you can do to help prevent falls. This information is not intended to replace advice given to you by your health care provider. Make sure you discuss any questions you have with your health care provider. Document Released: 11/25/2008 Document Revised: 07/07/2015 Document Reviewed: 03/05/2014 Elsevier Interactive Patient Education  2017 [employer].

## 2021-03-10 ENCOUNTER — Ambulatory Visit: Payer: Medicare Other

## 2021-03-28 DIAGNOSIS — H2512 Age-related nuclear cataract, left eye: Secondary | ICD-10-CM | POA: Diagnosis not present

## 2021-03-28 DIAGNOSIS — H25812 Combined forms of age-related cataract, left eye: Secondary | ICD-10-CM | POA: Diagnosis not present

## 2021-04-13 ENCOUNTER — Other Ambulatory Visit: Payer: Self-pay

## 2021-04-13 ENCOUNTER — Ambulatory Visit (INDEPENDENT_AMBULATORY_CARE_PROVIDER_SITE_OTHER): Payer: Medicare Other | Admitting: Internal Medicine

## 2021-04-13 ENCOUNTER — Encounter: Payer: Self-pay | Admitting: Internal Medicine

## 2021-04-13 VITALS — BP 137/74 | HR 92 | Temp 97.6°F | Wt 194.5 lb

## 2021-04-13 DIAGNOSIS — R059 Cough, unspecified: Secondary | ICD-10-CM | POA: Diagnosis not present

## 2021-04-13 DIAGNOSIS — J3489 Other specified disorders of nose and nasal sinuses: Secondary | ICD-10-CM | POA: Diagnosis not present

## 2021-04-13 LAB — VERITOR FLU A/B WAIVED
Influenza A: NEGATIVE
Influenza B: NEGATIVE

## 2021-04-13 MED ORDER — FEXOFENADINE HCL 180 MG PO TABS: 180.0000 mg | ORAL_TABLET | Freq: Every day | ORAL | 1 refills | Status: DC

## 2021-04-13 MED ORDER — AMOXICILLIN-POT CLAVULANATE 875-125 MG PO TABS: 1.0000 | ORAL_TABLET | Freq: Two times a day (BID) | ORAL | 0 refills | Status: DC

## 2021-04-13 NOTE — Progress Notes (Signed)
? ?BP 137/74   Pulse 92   Temp 97.6 ?F (36.4 ?C) (Oral)   Wt 194 lb 8 oz (88.2 kg)   LMP  (LMP Unknown)   SpO2 96%   BMI 30.92 kg/m?   ? ?Subjective:  ? ? Patient ID: Marie Perry, female    DOB: Jul 14, 1950, 71 y.o.   MRN: [id number] ? ?Chief Complaint  ?Patient presents with  ? cough  ?  Sore throat, and head congestion. X 2 days ago.   ? ear pain  ?  Left ear pain since last night.  ? ? ?HPI: ?Marie Perry is a 71 y.o. female ? ?Patient presents with: ?cough: Sore throat, and head congestion. X 2 days ago.  ?ear pain: Left ear pain since last night. ? ? ? ?URI  ?This is a new problem. The current episode started in the past 7 days. The problem has been waxing and waning. There has been no fever. Associated symptoms include congestion, coughing, ear pain, headaches, rhinorrhea, a sore throat and swollen glands. Pertinent negatives include no chest pain, diarrhea, dysuria, joint pain, joint swelling, nausea, neck pain, plugged ear sensation, rash, sinus pain, sneezing, vomiting or wheezing.  ? ?Chief Complaint  ?Patient presents with  ? cough  ?  Sore throat, and head congestion. X 2 days ago.   ? ear pain  ?  Left ear pain since last night.  ? ? ?Relevant past medical, surgical, family and social history reviewed and updated as indicated. Interim medical history since our last visit reviewed. ?Allergies and medications reviewed and updated. ? ?Review of Systems  ?HENT:  Positive for congestion, ear pain, rhinorrhea and sore throat. Negative for sinus pain and sneezing.   ?Respiratory:  Positive for cough. Negative for wheezing.   ?Cardiovascular:  Negative for chest pain.  ?Gastrointestinal:  Negative for diarrhea, nausea and vomiting.  ?Genitourinary:  Negative for dysuria.  ?Musculoskeletal:  Negative for joint pain and neck pain.  ?Skin:  Negative for rash.  ?Neurological:  Positive for headaches.  ? ?Per HPI unless specifically indicated above ? ?   ?Objective:  ?  ?BP 137/74   Pulse 92    Temp 97.6 ?F (36.4 ?C) (Oral)   Wt 194 lb 8 oz (88.2 kg)   LMP  (LMP Unknown)   SpO2 96%   BMI 30.92 kg/m?   ?Wt Readings from Last 3 Encounters:  ?04/13/21 194 lb 8 oz (88.2 kg)  ?01/23/21 191 lb 9.6 oz (86.9 kg)  ?12/09/20 190 lb 3.2 oz (86.3 kg)  ?  ?Physical Exam ? ?Results for orders placed or performed in visit on 04/13/21  ?Novel Coronavirus, NAA ([facility])  ? Specimen: Nasopharyngeal(NP) swabs in vial transport medium  ?Result Value Ref Range  ? SARS-CoV-2, NAA Not Detected Not Detected  ?Veritor Flu A/B Waived  ?Result Value Ref Range  ? Influenza A Negative Negative  ? Influenza B Negative Negative  ? ?   ? ? ?Current Outpatient Medications:  ?  amoxicillin-clavulanate (AUGMENTIN) 875-125 MG tablet, Take 1 tablet by mouth 2 (two) times daily., Disp: 20 tablet, Rfl: 0 ?  busPIRone (BUSPAR) 10 MG tablet, Take 1 tablet (10 mg total) by mouth 2 (two) times daily., Disp: 180 tablet, Rfl: 4 ?  Cholecalciferol (VITAMIN D PO), Take 2,000 Units by mouth daily. , Disp: , Rfl:  ?  Cyanocobalamin (B-12 PO), Take 1,000 mcg by mouth daily. , Disp: , Rfl:  ?  DULoxetine (CYMBALTA) 60 MG capsule, Take 1 capsule (  60 mg total) by mouth daily., Disp: 90 capsule, Rfl: 4 ?  fexofenadine (ALLEGRA ALLERGY) 180 MG tablet, Take 1 tablet (180 mg total) by mouth daily., Disp: 10 tablet, Rfl: 1 ?  omeprazole (PRILOSEC) 20 MG capsule, Take 1 capsule (20 mg total) by mouth daily., Disp: 90 capsule, Rfl: 4 ?  rosuvastatin (CRESTOR) 10 MG tablet, Take 1 tablet (10 mg total) by mouth daily., Disp: 90 tablet, Rfl: 4 ?  brimonidine (ALPHAGAN) 0.2 % ophthalmic solution, Place 1 drop into the left eye 3 (three) times daily., Disp: , Rfl:   ? ? ?Assessment & Plan:  ?URI: start pt on augmentin swabbed today as below ?. pt advised to take Tylenol q 4- 6 hourly as needed. pt to take allegra q pm as needed and to call office if symptoms worsened pt verbalised understanding of such. ? ?  ? ? ? ? ?Problem List Items Addressed This Visit   ? ?  ?  Other  ? Cough - Primary  ? Relevant Orders  ? Novel Coronavirus, NAA ([facility]) (Completed)  ? Veritor Flu A/B Waived (Completed)  ? Sinus pressure  ? Relevant Orders  ? Novel Coronavirus, NAA ([facility]) (Completed)  ? Veritor Flu A/B Waived (Completed)  ?  ? ?Orders Placed This Encounter  ?Procedures  ? Novel Coronavirus, NAA ([facility])  ? Veritor Flu A/B Waived  ?  ? ?Meds ordered this encounter  ?Medications  ? amoxicillin-clavulanate (AUGMENTIN) 875-125 MG tablet  ?  Sig: Take 1 tablet by mouth 2 (two) times daily.  ?  Dispense:  20 tablet  ?  Refill:  0  ? fexofenadine (ALLEGRA ALLERGY) 180 MG tablet  ?  Sig: Take 1 tablet (180 mg total) by mouth daily.  ?  Dispense:  10 tablet  ?  Refill:  1  ?  ? ?Follow up plan: ?No follow-ups on file. ? ? ? ?

## 2021-04-14 LAB — NOVEL CORONAVIRUS, NAA: SARS-CoV-2, NAA: NOT DETECTED

## 2021-04-17 NOTE — Progress Notes (Signed)
Please let pt know this was normal.

## 2021-04-19 DIAGNOSIS — H25011 Cortical age-related cataract, right eye: Secondary | ICD-10-CM | POA: Diagnosis not present

## 2021-04-19 DIAGNOSIS — H2511 Age-related nuclear cataract, right eye: Secondary | ICD-10-CM | POA: Diagnosis not present

## 2021-05-09 DIAGNOSIS — H25011 Cortical age-related cataract, right eye: Secondary | ICD-10-CM | POA: Diagnosis not present

## 2021-05-09 DIAGNOSIS — H2511 Age-related nuclear cataract, right eye: Secondary | ICD-10-CM | POA: Diagnosis not present

## 2021-05-09 DIAGNOSIS — H25811 Combined forms of age-related cataract, right eye: Secondary | ICD-10-CM | POA: Diagnosis not present

## 2021-05-11 DIAGNOSIS — Z20822 Contact with and (suspected) exposure to covid-19: Secondary | ICD-10-CM | POA: Diagnosis not present

## 2021-05-22 ENCOUNTER — Ambulatory Visit: Payer: Self-pay | Admitting: *Deleted

## 2021-05-22 ENCOUNTER — Telehealth: Payer: Medicare Other

## 2021-05-22 NOTE — Telephone Encounter (Signed)
I returned pt's call.  C/o urinary discomfort for roughly a week and a half.  C/o burning and pressure.   Wants something called in for it. ? ? ?Reason for Disposition ? Age > 50 years ? ?Answer Assessment - Initial Assessment Questions ?1. SEVERITY: "How bad is the pain?"  (e.g., Scale 1-10; mild, moderate, or severe) ?  - MILD (1-3): complains slightly about urination hurting ?  - MODERATE (4-7): interferes with normal activities   ?  - SEVERE (8-10): excruciating, unwilling or unable to urinate because of the pain  ?    Burning and pressure with urination ?2. FREQUENCY: "How many times have you had painful urination today?"  ?    A week and a half ?3. PATTERN: "Is pain present every time you urinate or just sometimes?"  ?    Every time I go.   ?4. ONSET: "When did the painful urination start?"  ?    A week a half. ?5. FEVER: "Do you have a fever?" If Yes, ask: "What is your temperature, how was it measured, and when did it start?" ?    No  ?6. PAST UTI: "Have you had a urine infection before?" If Yes, ask: "When was the last time?" and "What happened that time?"  ?    No ?7. CAUSE: "What do you think is causing the painful urination?"  (e.g., UTI, scratch, Herpes sore) ?    UTI ?8. OTHER SYMPTOMS: "Do you have any other symptoms?" (e.g., flank pain, vaginal discharge, genital sores, urgency, blood in urine) ?    Burning and pressure.   No blood in urine. ?9. PREGNANCY: "Is there any chance you are pregnant?" "When was your last menstrual period?" ?    N/A ? ?Protocols used: Urination Pain - Female-A-AH ? ?

## 2021-05-22 NOTE — Telephone Encounter (Signed)
?  Chief Complaint: Urinary burning and pressure ?Symptoms: above ?Frequency: for past week and half ?Pertinent Negatives: Patient denies blood in urine or fever ?Disposition: '[]'$ ED /'[]'$ Urgent Care (no appt availability in office) / '[x]'$ Appointment(In office/virtual)/ '[]'$  [place] [facility]/ '[]'$ Home Care/ '[]'$ Refused Recommended Disposition /'[]'$ [place] [facility]/ '[]'$  Follow-up with PCP ?Additional Notes:   ?

## 2021-05-23 ENCOUNTER — Ambulatory Visit (INDEPENDENT_AMBULATORY_CARE_PROVIDER_SITE_OTHER): Payer: Medicare Other | Admitting: Unknown Physician Specialty

## 2021-05-23 ENCOUNTER — Encounter: Payer: Self-pay | Admitting: Unknown Physician Specialty

## 2021-05-23 VITALS — BP 135/78 | HR 83 | Temp 98.8°F | Wt 193.0 lb

## 2021-05-23 DIAGNOSIS — N3000 Acute cystitis without hematuria: Secondary | ICD-10-CM

## 2021-05-23 DIAGNOSIS — R300 Dysuria: Secondary | ICD-10-CM

## 2021-05-23 DIAGNOSIS — Z20822 Contact with and (suspected) exposure to covid-19: Secondary | ICD-10-CM | POA: Diagnosis not present

## 2021-05-23 LAB — URINALYSIS, ROUTINE W REFLEX MICROSCOPIC
Bilirubin, UA: NEGATIVE
Glucose, UA: NEGATIVE
Ketones, UA: NEGATIVE
Nitrite, UA: NEGATIVE
Specific Gravity, UA: >1.03 — ABNORMAL HIGH (ref 1.005–1.030)
Urobilinogen, Ur: 0.2 mg/dL (ref 0.2–1.0)
pH, UA: 5.5 (ref 5.0–7.5)

## 2021-05-23 LAB — MICROSCOPIC EXAMINATION

## 2021-05-23 MED ORDER — SULFAMETHOXAZOLE-TRIMETHOPRIM 800-160 MG PO TABS: 1.0000 | ORAL_TABLET | Freq: Two times a day (BID) | ORAL | 0 refills | Status: DC

## 2021-05-23 NOTE — Progress Notes (Signed)
? ?BP 135/78   Pulse 83   Temp 98.8 ?F (37.1 ?C) (Oral)   Wt 193 lb (87.5 kg)   LMP  (LMP Unknown)   SpO2 95%   BMI 30.68 kg/m?   ? ?Subjective:  ? ? Patient ID: Marie Perry, female    DOB: 1950/03/14, 71 y.o.   MRN: [id number] ? ?HPI: ?Marie Perry is a 71 y.o. female ? ?Chief Complaint  ?Patient presents with  ? Dysuria  ?  Onset about 1 week ago.   ? ?Dysuria  ?This is a new problem. The current episode started in the past 7 days. The problem occurs every urination. The problem has been waxing and waning. The quality of the pain is described as burning. The pain is mild. There has been no fever. Associated symptoms include frequency, sweats and urgency. Pertinent negatives include no chills, discharge, flank pain, hematuria, hesitancy, nausea, possible pregnancy or vomiting. She has tried increased fluids for the symptoms. The treatment provided no relief. There is no history of kidney stones, recurrent UTIs or urinary stasis.  ? ?Relevant past medical, surgical, family and social history reviewed and updated as indicated. Interim medical history since our last visit reviewed. ?Allergies and medications reviewed and updated. ? ?Review of Systems  ?Constitutional:  Negative for chills.  ?Gastrointestinal:  Negative for nausea and vomiting.  ?Genitourinary:  Positive for dysuria, frequency and urgency. Negative for flank pain, hematuria and hesitancy.  ? ?Per HPI unless specifically indicated above ? ?   ?Objective:  ?  ?BP 135/78   Pulse 83   Temp 98.8 ?F (37.1 ?C) (Oral)   Wt 193 lb (87.5 kg)   LMP  (LMP Unknown)   SpO2 95%   BMI 30.68 kg/m?   ?Wt Readings from Last 3 Encounters:  ?05/23/21 193 lb (87.5 kg)  ?04/13/21 194 lb 8 oz (88.2 kg)  ?01/23/21 191 lb 9.6 oz (86.9 kg)  ?  ?Physical Exam ?Vitals and nursing note reviewed.  ?Constitutional:   ?   General: She is not in acute distress. ?   Appearance: Normal appearance. She is well-developed.  ?HENT:  ?   Head: Normocephalic and  atraumatic.  ?Eyes:  ?   General: Lids are normal. No scleral icterus.    ?   Right eye: No discharge.     ?   Left eye: No discharge.  ?   Conjunctiva/sclera: Conjunctivae normal.  ?Cardiovascular:  ?   Rate and Rhythm: Normal rate and regular rhythm.  ?Pulmonary:  ?   Effort: Pulmonary effort is normal. No respiratory distress.  ?Abdominal:  ?   General: Bowel sounds are normal. There is no distension.  ?   Palpations: Abdomen is soft. There is no hepatomegaly or splenomegaly.  ?   Tenderness: There is no abdominal tenderness.  ?Musculoskeletal:     ?   General: Normal range of motion.  ?Skin: ?   Coloration: Skin is not pale.  ?   Findings: No rash.  ?Neurological:  ?   Mental Status: She is alert and oriented to person, place, and time.  ?Psychiatric:     ?   Behavior: Behavior normal.     ?   Thought Content: Thought content normal.     ?   Judgment: Judgment normal.  ? ?Urine is positive ?Results for orders placed or performed in visit on 05/23/21  ?Microscopic Examination  ? Urine  ?Result Value Ref Range  ? WBC, UA 6-10 (A) 0 - 5 /  hpf  ? RBC 0-2 0 - 2 /hpf  ? Epithelial Cells (non renal) 0-10 0 - 10 /hpf  ? Casts Present (A) None seen /lpf  ? Cast Type Hyaline casts N/A  ? Mucus, UA Present (A) Not Estab.  ? Bacteria, UA Many (A) None seen/Few  ?Urinalysis, Routine w reflex microscopic  ?Result Value Ref Range  ? Specific Gravity, UA >1.030 (H) 1.005 - 1.030  ? pH, UA 5.5 5.0 - 7.5  ? Color, UA Yellow Yellow  ? Appearance Ur Cloudy (A) Clear  ? Leukocytes,UA 1+ (A) Negative  ? Protein,UA Trace (A) Negative/Trace  ? Glucose, UA Negative Negative  ? Ketones, UA Negative Negative  ? RBC, UA Trace (A) Negative  ? Bilirubin, UA Negative Negative  ? Urobilinogen, Ur 0.2 0.2 - 1.0 mg/dL  ? Nitrite, UA Negative Negative  ? Microscopic Examination See below:   ? ?   ?Assessment & Plan:  ? ?Problem List Items Addressed This Visit   ?None ?Visit Diagnoses   ? ? Dysuria    -  Primary  ? Secondary to UTI  ? Relevant  Orders  ? Urinalysis, Routine w reflex microscopic (Completed)  ? Acute cystitis without hematuria      ? Rx for Septra for 5 days.  Cranberry juice may be helpful and push fluids  ? Relevant Orders  ? Urine Culture  ? ?  ?  ? ?Follow up plan: ?No follow-ups on file. ? ? ? ? ? ?

## 2021-05-26 ENCOUNTER — Telehealth (INDEPENDENT_AMBULATORY_CARE_PROVIDER_SITE_OTHER): Payer: Medicare Other | Admitting: Physician Assistant

## 2021-05-26 ENCOUNTER — Ambulatory Visit: Payer: Self-pay

## 2021-05-26 ENCOUNTER — Encounter: Payer: Self-pay | Admitting: Physician Assistant

## 2021-05-26 DIAGNOSIS — N3000 Acute cystitis without hematuria: Secondary | ICD-10-CM | POA: Diagnosis not present

## 2021-05-26 DIAGNOSIS — R112 Nausea with vomiting, unspecified: Secondary | ICD-10-CM

## 2021-05-26 LAB — URINE CULTURE

## 2021-05-26 MED ORDER — AMOXICILLIN-POT CLAVULANATE 875-125 MG PO TABS: 1.0000 | ORAL_TABLET | Freq: Two times a day (BID) | ORAL | 0 refills | Status: AC

## 2021-05-26 MED ORDER — ONDANSETRON HCL 4 MG PO TABS: 4.0000 mg | ORAL_TABLET | Freq: Three times a day (TID) | ORAL | 0 refills | Status: DC | PRN

## 2021-05-26 NOTE — Telephone Encounter (Signed)
? ?  Chief Complaint: Vomiting after starting Bactrim ?Symptoms: Vomiting ?Frequency: Started Tuesday  ?Pertinent Negatives: Patient denies  ?Disposition: '[]'$ ED /'[]'$ Urgent Care (no appt availability in office) / '[]'$ Appointment(In office/virtual)/ '[]'$  [place] [facility]/ '[]'$ Home Care/ '[]'$ Refused Recommended Disposition /'[]'$ [place] [facility]/ '[]'$  Follow-up with PCP ?Additional Notes: Pt. Believes she is reacting to Bactrim. Please advise pt.  ?Answer Assessment - Initial Assessment Questions ?1. NAME of MEDICATION: "What medicine are you calling about?" ?    Bactrim ?2. QUESTION: "What is your question?" (e.g., double dose of medicine, side effect) ?    Side effect ?3. PRESCRIBING HCP: "Who prescribed it?" Reason: if prescribed by specialist, call should be referred to that group. ?    [place] ?4. SYMPTOMS: "Do you have any symptoms?" ?    Vomiting ?5. SEVERITY: If symptoms are present, ask "Are they mild, moderate or severe?" ?    Severe ?6. PREGNANCY:  "Is there any chance that you are pregnant?" "When was your last menstrual period?" ?    No ? ?Protocols used: Medication Question Call-A-AH ? ?

## 2021-05-26 NOTE — Patient Instructions (Addendum)
I recommend that you stop taking the Bactrim and start a new antibiotic for your UTI  ?I am sending in a new script for Augmentin 875-125 mg by mouth twice per day for 5 days ?Finish the entire course of antibiotics  ?For your nausea and vomiting I am sending in a medication called Zofran 4 mg that you can take every 8 hours as needed  ?Please stay well hydrated, take your antibiotics with food/ meal ?Try to eat several small, bland meals while your nausea is resolving- plain toast, brown rice, boiled chicken, apples, bananas, etc  ?Slowly return to normal diet as you are able to  ?You can take Tylenol as needed for pain and headaches ?Please go to the ED if you have the following: you are unable to stop vomiting, fevers, passing out, low blood pressure, racing pulse  ?

## 2021-05-26 NOTE — Progress Notes (Signed)
? ? ?Virtual Visit via Video Note ? ?I connected with Marie Perry on 05/26/21 at  1:00 PM EDT by a video enabled telemedicine application and verified that I am speaking with the correct person using two identifiers. ? ?Location: ?Patient: At home, Marie Perry, Marie Perry  ?Provider: [facility], Phillip Heal, Marie Perry  ?  ?I discussed the limitations of evaluation and management by telemedicine and the availability of in person appointments. The patient expressed understanding and agreed to proceed. ? ?History of Present Illness: ? ?Reports she came in on Tuesday for UTI  ?She was started on abx  ?States she took the abx Tuesday night and was vomiting around 4 am Wed  ?Reports she is still having intermittent vomiting  ?She states she has not been trying to eat much  ?Denies rashes, SOB, tongue or throat swelling/ closing ?States she feels a bit dehydrated  ?She did not take her normal medications this AM  ? ?States she has 5 bactrim tablets left of script  ?States she is still having UTI symptoms, burning sensation with urinating, headache ?Denies flank pain, fevers,  ? ? ?Observations/Objective: ? ?Observations limited due to nature of virtual visit; able to make the following: ? ?Alert, oriented female ?Does not appear to be in acute distress ?Able to engage appropriately in conversation  ? ?Assessment and Plan: ? ?Problem List Items Addressed This Visit   ?None ?Visit Diagnoses   ? ? Acute cystitis without hematuria    -  Primary ?Acute, stable problem ?Was seen for this on Tuesday- given Bactrim for UTI  ?Reports she became nauseous and has been vomiting intermittently since then ?Reports she has taken approximately half of Bactrim course, still having burning with urination  ?Recommend she dc Bactrim at this time ?Reviewed results of urine culture - susceptible to Augmentin  ?Will start Augmentin 875-125 mg PO BID x 5 days with hopes this will be less irritating to GI system ?Recommend she stay well hydrated  especially in setting of nausea and vomiting  ?  ? Relevant Medications  ? amoxicillin-clavulanate (AUGMENTIN) 875-125 MG tablet  ? Nausea and vomiting, unspecified vomiting type     ?Acute, new problem  ?Began Wed morning after taking Bactrim for UTI  ?Suspect this is reaction to Bactrim ?Recommend dc Bactrim at this time ?Will provide Zofran 4 mg PO TID PRN for nausea and vomiting ?Recommend she stay well hydrated with at least 75 oz of water per day, may add pedialyte sparingly to assist with electrolyte replenishment ?Recommend small, bland meals/ snacks to reduce gastric irritation ?Recommend using Tylenol for pain as needed vs Ibuprofen  ?Follow up as needed for persisting or worsening symptoms  ?Reviewed ED precautions with patient  ?  ? Relevant Medications  ? ondansetron (ZOFRAN) 4 MG tablet  ? ?  ? ? ?Follow Up Instructions: ? ?  ?I discussed the assessment and treatment plan with the patient. The patient was provided an opportunity to ask questions and all were answered. The patient agreed with the plan and demonstrated an understanding of the instructions. ?  ?The patient was advised to call back or seek an in-person evaluation if the symptoms worsen or if the condition fails to improve as anticipated. ? ?I provided 19 minutes of non-face-to-face time during this encounter. ? ?No follow-ups on file. ? ? ?I, Jerrin Recore E Pat Sires, PA-C, have reviewed all documentation for this visit. The documentation on 05/26/21 for the exam, diagnosis, procedures, and orders are all accurate and complete. ? ? ?  Marie Perry, MHS, PA-C ?[facility] ?[facility]   ? ? ? ?

## 2021-05-28 NOTE — Progress Notes (Signed)
Contacted via [place] ? ? ?Good evening Marie Perry, I reviewed chart with recent events.  See you were started on Bactrim, but changed to Augmentin due to stomach upset with Bactrim.  Both appear to cover what is growing in your urine, you are showing infection.  I recommend complete Augmentin and schedule to follow-up with me in office in 2 weeks to recheck urin.  Any questions?

## 2021-06-07 ENCOUNTER — Ambulatory Visit (INDEPENDENT_AMBULATORY_CARE_PROVIDER_SITE_OTHER): Payer: Medicare Other | Admitting: Licensed Clinical Social Worker

## 2021-06-07 DIAGNOSIS — M810 Age-related osteoporosis without current pathological fracture: Secondary | ICD-10-CM

## 2021-06-07 DIAGNOSIS — F332 Major depressive disorder, recurrent severe without psychotic features: Secondary | ICD-10-CM

## 2021-06-07 NOTE — Patient Instructions (Signed)
Visit Information ? ?Thank you for taking time to visit with me today. Please don't hesitate to contact me if I can be of assistance to you before our next scheduled telephone appointment. ? ?Following are the goals we discussed today:  ?[name] Self Care Activities:  ?Call provider office for new concerns or questions ?Attend all scheduled provider appointments ?Utilize healthy coping skills discussed ? ? ?If you are experiencing a Mental Health or [place] or need someone to talk to, please call the Suicide and Crisis Lifeline: 988 ?call 911  ? ?Patient verbalizes understanding of instructions and care plan provided today and agrees to view in [place]. Active MyChart status confirmed with patient.   ? ?No further follow up required: Patient has completed all care plan goals associated with CCM LCSW ? ?Christa See, MSW, LCSW ?[facility] ?[facility] ?[email or web address] ?Phone [phone] ?3:08 PM ? ?

## 2021-06-07 NOTE — Chronic Care Management (AMB) (Signed)
?Chronic Care Management  ? ? Clinical Social Work Note ? ?06/07/2021 ?Name: Enrique Weiss MRN: [id number] DOB: 1950-04-29 ? ?Mackenzie Groom is a 71 y.o. year old female who is a primary care patient of Cannady, Barbaraann Faster, NP. The CCM team was consulted to assist the patient with chronic disease management and/or care coordination needs related to: Mental Health Counseling and Resources and Caregiver Stress.  ? ?Engaged with patient by telephone for follow up visit in response to provider referral for social work chronic care management and care coordination services.  ? ?Consent to Services:  ?The patient was given information about Chronic Care Management services, agreed to services, and gave verbal consent prior to initiation of services.  Please see initial visit note for detailed documentation.  ? ?Patient agreed to services and consent obtained.  ? ?Summary:  Patient has completed all goals associated with CCM LCSW .  See Care Plan below for interventions and patient self-care activities. ? ?Recommendation: Patient may benefit from, and is in agreement to continue working with PCP to manage health conditions.  ? ?Follow up Plan: All care plan goals have been met. Will disconnect from care team after this encounter. Patient has been informed to contact the office if new needs arise. ? ?SDOH (Social Determinants of Health) assessments and interventions performed:   ? ?Advanced Directives Status: Not addressed in this encounter. ? ?CCM Care Plan ? ?Allergies  ?Allergen Reactions  ? Bactrim [Sulfamethoxazole-Trimethoprim] Nausea And Vomiting  ? ? ?Outpatient Encounter Medications as of 06/07/2021  ?Medication Sig  ? brimonidine (ALPHAGAN) 0.2 % ophthalmic solution Place 1 drop into the left eye 3 (three) times daily.  ? busPIRone (BUSPAR) 10 MG tablet Take 1 tablet (10 mg total) by mouth 2 (two) times daily.  ? Cholecalciferol (VITAMIN D PO) Take 2,000 Units by mouth daily.   ? Cyanocobalamin (B-12 PO)  Take 1,000 mcg by mouth daily.   ? DULoxetine (CYMBALTA) 60 MG capsule Take 1 capsule (60 mg total) by mouth daily.  ? omeprazole (PRILOSEC) 20 MG capsule Take 1 capsule (20 mg total) by mouth daily.  ? ondansetron (ZOFRAN) 4 MG tablet Take 1 tablet (4 mg total) by mouth every 8 (eight) hours as needed for nausea or vomiting.  ? rosuvastatin (CRESTOR) 10 MG tablet Take 1 tablet (10 mg total) by mouth daily.  ? ?No facility-administered encounter medications on file as of 06/07/2021.  ? ? ?Patient Active Problem List  ? Diagnosis Date Noted  ? Cough 04/13/2021  ? Sinus pressure 04/13/2021  ? Mixed hyperlipidemia 12/09/2020  ? Vitamin B12 deficiency 02/27/2018  ? Osteoporosis 04/15/2017  ? Advanced care planning/counseling discussion 02/22/2017  ? Hx of colonic polyps 04/13/2016  ? IBS (irritable bowel syndrome) 04/29/2015  ? Allergic rhinitis 04/29/2015  ? GERD (gastroesophageal reflux disease) 04/29/2015  ? 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 Vitamin D deficiency 04/29/2015  ? Sleep apnea 04/29/2015  ? ? ?Conditions to be addressed/monitored: Depression and Osteoporosis; Caregiver Stress ? ?Care Plan : General Social Work (Adult)  ?Updates made by Rebekah Chesterfield, LCSW since 06/07/2021 12:00 AM  ?  ? ?Problem: Response to Treatment (Depression)   ?  ? ?Long-Range Goal: Response to Treatment Maximized Completed 06/07/2021  ?Start Date: 04/08/2020  ?Expected End Date: 06/11/2021  ?This Visit's Progress: On track  ?Recent Progress: On track  ?Priority: Medium  ?Note:   ?Current Barriers:  ?Knowledge Deficits related to long term strategies for management of depression and caregiver stress ?Lacks  caregiver support. 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  ?Social Work Clinical Goal(s):  ?Over the next 90 days, patient will verbalize basic understanding of depression/stress process and self health management plan as evidenced by her participation in development of long term plan of care and  institution of self health management strategies ?Over the next 90 days, patient will be educated on healthy coping skills and will implement these appropriate self care methods into her daily routine to combat depression/anxiety. ?Over the next 90 days, patient will be educated on appropriate mental health resources that are available to her as well as socialization opportunities.  ?Interventions:  ?Patient interviewed and appropriate assessments performed ?Patient shared depression and anxiety symptoms are managed well ?Patient denies any resource needs for caregiver stress, noting symptoms have decreased ?Patient was successful in identifying healthy coping skills to assist with stress management  ?LCSW provided reflective listening and implemented appropriate interventions to help suppport patient and her emotional needs  ?Discussed plans with patient for ongoing care management follow up and provided patient with direct contact information for care management team ?[name] Self Care Activities:  ?Call provider office for new concerns or questions ?Attend all scheduled provider appointments ?Utilize healthy coping skills discussed ? ?  ?  ? ?Christa See, MSW, LCSW ?[place] [facility] ?[email or web address] ?Phone [phone] ?3:05 PM ? ? ? ?

## 2021-06-10 NOTE — Patient Instructions (Addendum)
#1 Try reducing Prilosec use to every other day and the every third day -- if do not tolerate reduction then let me know. ? ?#2 Reduce Rosuvastatin to 3 days a week only and if muscle aches ongoing reduce to 1 day a week -- if ongoing muscle aches then notify me. ? ?#3 When you get Prolia schedule nurse visit for injection. ? ?Managing Depression, Adult ?Depression is a mental health condition that affects your thoughts, feelings, and actions. Being diagnosed with depression can bring you relief if you did not know why you have felt or behaved a certain way. It could also leave you feeling overwhelmed with uncertainty about your future. Preparing yourself to manage your symptoms can help you feel more positive about your future. ?How to manage lifestyle changes ?Managing stress ? ?Stress is your body's reaction to life changes and events, both good and bad. Stress can add to your feelings of depression. Learning to manage your stress can help lessen your feelings of depression. ?Try some of the following approaches to reducing your stress (stress reduction techniques): ?Listen to music that you enjoy and that inspires you. ?Try using a meditation app or take a meditation class. ?Develop a practice that helps you connect with your spiritual self. Walk in nature, pray, or go to a place of worship. ?Do some deep breathing. To do this, inhale slowly through your nose. Pause at the top of your inhale for a few seconds and then exhale slowly, letting your muscles relax. ?Practice yoga to help relax and work your muscles. ?Choose a stress reduction technique that suits your lifestyle and personality. These techniques take time and practice to develop. Set aside 5-15 minutes a day to do them. Therapists can offer training in these techniques. Other things you can do to manage stress include: ?Keeping a stress diary. ?Knowing your limits and saying no when you think something is too much. ?Paying attention to how you react  to certain situations. You may not be able to control everything, but you can change your reaction. ?Adding humor to your life by watching funny films or TV shows. ?Making time for activities that you enjoy and that relax you. ? ?Medicines ?Medicines, such as antidepressants, are often a part of treatment for depression. ?Talk with your pharmacist or health care provider about all the medicines, supplements, and herbal products that you take, their possible side effects, and what medicines and other products are safe to take together. ?Make sure to report any side effects you may have to your health care provider. ?Relationships ?Your health care provider may suggest family therapy, couples therapy, or individual therapy as part of your treatment. ?How to recognize changes ?Everyone responds differently to treatment for depression. As you recover from depression, you may start to: ?Have more interest in doing activities. ?Feel less hopeless. ?Have more energy. ?Overeat less often, or have a better appetite. ?Have better mental focus. ?It is important to recognize if your depression is not getting better or is getting worse. The symptoms you had in the beginning may return, such as: ?Tiredness (fatigue) or low energy. ?Eating too much or too little. ?Sleeping too much or too little. ?Feeling restless, agitated, or hopeless. ?Trouble focusing or making decisions. ?Unexplained physical complaints. ?Feeling irritable, angry, or aggressive. ?If you or your family members notice these symptoms coming back, let your health care provider know right away. ?Follow these instructions at home: ?Activity ? ?Try to get some form of exercise each day, such as  walking, biking, swimming, or lifting weights. ?Practice stress reduction techniques. ?Engage your mind by taking a class or doing some volunteer work. ?Lifestyle ?Get the right amount and quality of sleep. ?Cut down on using caffeine, tobacco, alcohol, and other potentially  harmful substances. ?Eat a healthy diet that includes plenty of vegetables, fruits, whole grains, low-fat dairy products, and lean protein. Do not eat a lot of foods that are high in solid fats, added sugars, or salt (sodium). ?General instructions ?Take over-the-counter and prescription medicines only as told by your health care provider. ?Keep all follow-up visits as told by your health care provider. This is important. ?Where to find support ?Talking to others ? ?Friends and family members can be sources of support and guidance. Talk to trusted friends or family members about your condition. Explain your symptoms to them, and let them know that you are working with a health care provider to treat your depression. Tell friends and family members how they also can be helpful. ?Finances ?Find appropriate mental health providers that fit with your financial situation. ?Talk with your health care provider about options to get reduced prices on your medicines. ?Where to find more information ?You can find support in your area from: ?Anxiety and Depression Association of America (ADAA): https://www.clark.net/ ?Mental Health America: www.mentalhealthamerica.net ?National Alliance on Mental Illness: www.nami.org ?Contact a health care provider if: ?You stop taking your antidepressant medicines, and you have any of these symptoms: ?Nausea. ?Headache. ?Light-headedness. ?Chills and body aches. ?Not being able to sleep (insomnia). ?You or your friends and family think your depression is getting worse. ?Get help right away if: ?You have thoughts of hurting yourself or others. ?If you ever feel like you may hurt yourself or others, or have thoughts about taking your own life, get help right away. Go to your nearest emergency department or: ?Call your local emergency services (911 in the [place] ?Call a suicide crisis helpline, such as the [facility] at (343)841-7102 or 988 in the [place]. This is open 24 hours a  day in the [place] ?Text the Crisis Text Line at (813)790-2967 (in the [place].). ?Summary ?If you are diagnosed with depression, preparing yourself to manage your symptoms is a good way to feel positive about your future. ?Work with your health care provider on a management plan that includes stress reduction techniques, medicines (if applicable), therapy, and healthy lifestyle habits. ?Keep talking with your health care provider about how your treatment is working. ?If you have thoughts about taking your own life, call a suicide crisis helpline or text a crisis text line. ?This information is not intended to replace advice given to you by your health care provider. Make sure you discuss any questions you have with your health care provider. ?Document Revised: 08/24/2020 Document Reviewed: 12/10/2018 ?Elsevier Patient Education ? [place]. ? ?

## 2021-06-11 DIAGNOSIS — M810 Age-related osteoporosis without current pathological fracture: Secondary | ICD-10-CM

## 2021-06-11 DIAGNOSIS — F332 Major depressive disorder, recurrent severe without psychotic features: Secondary | ICD-10-CM

## 2021-06-12 ENCOUNTER — Telehealth: Payer: Self-pay

## 2021-06-12 ENCOUNTER — Ambulatory Visit (INDEPENDENT_AMBULATORY_CARE_PROVIDER_SITE_OTHER): Payer: Medicare Other | Admitting: Nurse Practitioner

## 2021-06-12 ENCOUNTER — Encounter: Payer: Self-pay | Admitting: Nurse Practitioner

## 2021-06-12 ENCOUNTER — Other Ambulatory Visit: Payer: Self-pay

## 2021-06-12 VITALS — BP 96/62 | HR 76 | Temp 98.8°F | Ht 66.5 in | Wt 191.8 lb

## 2021-06-12 DIAGNOSIS — Z1211 Encounter for screening for malignant neoplasm of colon: Secondary | ICD-10-CM | POA: Diagnosis not present

## 2021-06-12 DIAGNOSIS — E559 Vitamin D deficiency, unspecified: Secondary | ICD-10-CM | POA: Diagnosis not present

## 2021-06-12 DIAGNOSIS — Z23 Encounter for immunization: Secondary | ICD-10-CM

## 2021-06-12 DIAGNOSIS — Z0000 Encounter for general adult medical examination without abnormal findings: Secondary | ICD-10-CM

## 2021-06-12 DIAGNOSIS — K219 Gastro-esophageal reflux disease without esophagitis: Secondary | ICD-10-CM

## 2021-06-12 DIAGNOSIS — G4733 Obstructive sleep apnea (adult) (pediatric): Secondary | ICD-10-CM

## 2021-06-12 DIAGNOSIS — M810 Age-related osteoporosis without current pathological fracture: Secondary | ICD-10-CM | POA: Diagnosis not present

## 2021-06-12 DIAGNOSIS — E538 Deficiency of other specified B group vitamins: Secondary | ICD-10-CM

## 2021-06-12 DIAGNOSIS — F332 Major depressive disorder, recurrent severe without psychotic features: Secondary | ICD-10-CM

## 2021-06-12 DIAGNOSIS — E6609 Other obesity due to excess calories: Secondary | ICD-10-CM | POA: Diagnosis not present

## 2021-06-12 DIAGNOSIS — Z6830 Body mass index (BMI) 30.0-30.9, adult: Secondary | ICD-10-CM

## 2021-06-12 DIAGNOSIS — K582 Mixed irritable bowel syndrome: Secondary | ICD-10-CM | POA: Diagnosis not present

## 2021-06-12 DIAGNOSIS — E782 Mixed hyperlipidemia: Secondary | ICD-10-CM | POA: Diagnosis not present

## 2021-06-12 DIAGNOSIS — Z20822 Contact with and (suspected) exposure to covid-19: Secondary | ICD-10-CM | POA: Diagnosis not present

## 2021-06-12 DIAGNOSIS — Z86010 Personal history of colonic polyps: Secondary | ICD-10-CM

## 2021-06-12 MED ORDER — DULOXETINE HCL 60 MG PO CPEP: 60.0000 mg | ORAL_CAPSULE | Freq: Every day | ORAL | 4 refills | Status: DC

## 2021-06-12 MED ORDER — SHINGRIX 50 MCG/0.5ML IM SUSR: 0.5000 mL | Freq: Once | INTRAMUSCULAR | 0 refills | Status: AC

## 2021-06-12 MED ORDER — OMEPRAZOLE 20 MG PO CPDR: 20.0000 mg | DELAYED_RELEASE_CAPSULE | Freq: Every day | ORAL | 4 refills | Status: DC

## 2021-06-12 MED ORDER — ROSUVASTATIN CALCIUM 10 MG PO TABS
10.0000 mg | ORAL_TABLET | Freq: Every day | ORAL | 4 refills | Status: DC
Start: 2021-06-12 — End: 2022-06-21

## 2021-06-12 MED ORDER — BUSPIRONE HCL 10 MG PO TABS: 10.0000 mg | ORAL_TABLET | Freq: Two times a day (BID) | ORAL | 4 refills | Status: DC

## 2021-06-12 MED ORDER — NA SULFATE-K SULFATE-MG SULF 17.5-3.13-1.6 GM/177ML PO SOLN
1.0000 | Freq: Once | ORAL | 0 refills | Status: AC
Start: 2021-06-12 — End: 2021-06-12

## 2021-06-12 MED ORDER — DENOSUMAB 60 MG/ML ~~LOC~~ SOSY: 60.0000 mg | PREFILLED_SYRINGE | SUBCUTANEOUS | 0 refills | Status: DC

## 2021-06-12 NOTE — Assessment & Plan Note (Signed)
Chronic, stable.  Continue daily supplement and recheck B12 level today, adjust as needed. ?

## 2021-06-12 NOTE — Telephone Encounter (Signed)
Gastroenterology Pre-Procedure Review ? ?Request Date: 10/20/21 ?Requesting Physician: Dr. Marius Ditch ? ?PATIENT REVIEW QUESTIONS: The patient responded to the following health history questions as indicated:   ? ?1. Are you having any GI issues? no ?2. Do you have a personal history of Polyps?  Yes 2019 colonoscopy poor prep, 09/17/2016 colonoscopy noted polyps both performed by Dr. Gustavo Lah ?3. Do you have a family history of Colon Cancer or Polyps? no ?4. Diabetes Mellitus? no ?5. Joint replacements in the past 12 months?no joint replacements but she has had  Cataract Surgery Feb 14th and Macrch 28th  2023 ?6. Major health problems in the past 3 months?no ?7. Any artificial heart valves, MVP, or defibrillator?no ?   ?MEDICATIONS & ALLERGIES:    ?Patient reports the following regarding taking any anticoagulation/antiplatelet therapy:   ?Plavix, Coumadin, Eliquis, Xarelto, Lovenox, Pradaxa, Brilinta, or Effient? no ?Aspirin? no ? ?Patient confirms/reports the following medications:  ?Current Outpatient Medications  ?Medication Sig Dispense Refill  ? busPIRone (BUSPAR) 10 MG tablet Take 1 tablet (10 mg total) by mouth 2 (two) times daily. 180 tablet 4  ? Cholecalciferol (VITAMIN D PO) Take 2,000 Units by mouth daily.     ? Cyanocobalamin (B-12 PO) Take 1,000 mcg by mouth daily.     ? denosumab (PROLIA) 60 MG/ML SOSY injection Inject 60 mg into the skin every 6 (six) months. 4 mL 0  ? DULoxetine (CYMBALTA) 60 MG capsule Take 1 capsule (60 mg total) by mouth daily. 90 capsule 4  ? omeprazole (PRILOSEC) 20 MG capsule Take 1 capsule (20 mg total) by mouth daily. 90 capsule 4  ? ondansetron (ZOFRAN) 4 MG tablet Take 1 tablet (4 mg total) by mouth every 8 (eight) hours as needed for nausea or vomiting. 20 tablet 0  ? rosuvastatin (CRESTOR) 10 MG tablet Take 1 tablet (10 mg total) by mouth daily. 90 tablet 4  ? Zoster Vaccine Adjuvanted [facility]) injection Inject 0.5 mLs into the muscle once for 1 dose. Dose # 1 0.5 mL 0  ?  [START ON 08/30/2021] Zoster Vaccine Adjuvanted [facility]) injection Inject 0.5 mLs into the muscle once for 1 dose. Dose # 2 0.5 mL 0  ? ?No current facility-administered medications for this visit.  ? ? ?Patient confirms/reports the following allergies:  ?Allergies  ?Allergen Reactions  ? Bactrim [Sulfamethoxazole-Trimethoprim] Nausea And Vomiting  ? ? ?No orders of the defined types were placed in this encounter. ? ? ?AUTHORIZATION INFORMATION ?Primary Insurance: ?1D#: ?Group #: ? ?Secondary Insurance: ?1D#: ?Group #: ? ?SCHEDULE INFORMATION: ?Date: 10/20/21 ?Time: ?Location: [facility] ?

## 2021-06-12 NOTE — Assessment & Plan Note (Addendum)
Chronic, stable.  Denies SI/HI.  Will continue current medication regimen at this time and adjust as needed.  May consider switch from Cymbalta to Sertraline in future if exacerbation or mood changes.   

## 2021-06-12 NOTE — Assessment & Plan Note (Signed)
Chronic, stable.  She monitors diets and avoids foods that cause issue. ?

## 2021-06-12 NOTE — Assessment & Plan Note (Signed)
BMI 30.49.  Recommended eating smaller high protein, low fat meals more frequently and exercising 30 mins a day 5 times a week with a goal of 10-15lb weight loss in the next 3 months. Patient voiced their understanding and motivation to adhere to these recommendations. ? ?

## 2021-06-12 NOTE — Assessment & Plan Note (Signed)
Ongoing with underlying osteoporosis and poor tolerance Fosamax in past.  Continues on daily supplement.  Vit D level today. ?

## 2021-06-12 NOTE — Progress Notes (Signed)
? ?BP 96/62   Pulse 76   Temp 98.8 ?F (37.1 ?C) (Oral)   Ht 5' 6.5" (1.689 m)   Wt 191 lb 12.8 oz (87 kg)   LMP  (LMP Unknown)   SpO2 98%   BMI 30.49 kg/m?   ? ?Subjective:  ? ? Patient ID: Marie Perry, female    DOB: March 02, 1950, 71 y.o.   MRN: [id number] ? ?HPI: ?Marie Perry is a 71 y.o. female presenting on 06/12/2021 for comprehensive medical examination. Current medical complaints include:none ? ?She currently lives with: mother ?Menopausal Symptoms: no  ? ?VITAMIN D DEFICIENCY AND B12:  ?Takes daily supplements.  No recent falls or fractures.  Occasional muscle aches.  Recent Vit D level 44.3. ? ?OSTEOPOROSIS ?In 2019 DEXA noted this with T -2.9 and 01/04/21 with T-score of ?-3.0.  Tried oral medications, but caused GI issues and GERD.  Would like to try Prolia. ?Satisfied with current treatment?: no current treatment ?Past osteoporosis medications/treatments: Fosamax ?Adequate calcium & vitamin D: yes ?Intolerance to bisphosphonates:yes ?Weight bearing exercises: no  ? ?HYPERLIPIDEMIA ?Taking Rosuvastatin daily, but noticing muscle aches with this.   ?Hyperlipidemia status: good compliance ?Satisfied with current treatment?  yes ?Side effects:  no ?Medication compliance: good compliance ?Past cholesterol meds:  ?Supplements: none ?Aspirin:  no ?The 10-year ASCVD risk score (Arnett DK, et al., 2019) is: 6% ?  Values used to calculate the score: ?    Age: 20 years ?    Sex: Female ?    Is Non-Hispanic African American: No ?    Diabetic: No ?    Tobacco smoker: No ?    Systolic Blood Pressure: 96 mmHg ?    Is BP treated: No ?    HDL Cholesterol: 41 mg/dL ?    Total Cholesterol: 221 mg/dL ?Chest pain:  no ?Coronary artery disease:  no ?Family history CAD:  yes -- father with MIs ?Family history early CAD:  no  ? ?GERD ?Taking Prilosec 20 MG daily.  Has CPAP and used 100% of the time.   ?GERD control status: stable ?Satisfied with current treatment? no ?Heartburn frequency: few times a  week ?Medication side effects: no  ?Medication compliance: fluctuating ?Previous GERD medications: TUMS and Prilosec ?Antacid use frequency:  occasional ?Dysphagia: no ?Odynophagia:  no ?Hematemesis: no ?Blood in stool: no ?EGD: no ? 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  Current medications include Cymbalta 60 MG and Buspar 10 MG BID.  Endorses this has been a difficult time due to not being able to get out on occasion due to concern for exposing her mother.  Reports she has minimal socialization recently. ?Mood status: stable ?Satisfied with current treatment?: yes ?Symptom severity: moderate  ?Duration of current treatment : chronic ?Side effects: no ?Medication compliance: good compliance ?Psychotherapy/counseling: currently with the SW ?Previous psychiatric medications: Cymbalta and Buspar ?Depressed mood: yes ?Anxious mood: no ?Anhedonia: no ?Significant weight loss or gain: no ?Insomnia: at times has issues falling asleep ?Fatigue: no ?Feelings of worthlessness or guilt: no ?Impaired concentration/indecisiveness: yes ?Suicidal ideations: no ?Hopelessness: no ?Crying spells: at times, but reports these have improved with increase in Buspar ? ?  06/12/2021  ?  9:29 AM 05/23/2021  ? 10:43 AM 04/13/2021  ? 11:39 AM 04/13/2021  ? 11:38 AM 03/09/2021  ? 11:23 AM  ?Depression screen PHQ 2/9  ?Decreased Interest 1 2 2  1 1  ?Down, Depressed, Hopeless '1 2 2 1 1  '$ ?PHQ - 2 Score '2 4 4 2 2  '$ ?Altered sleeping '1 2 3 2 2  '$ ?Tired, decreased energy '1 2 3 2 2  '$ ?Change in appetite '1 2 2 '$ 0 1  ?Feeling bad or failure about yourself  '1 2 2 '$ 0 0  ?Trouble concentrating '1 1 3 '$ 0 0  ?Moving slowly or fidgety/restless 0 0 3 0 0  ?Suicidal thoughts 0 0 1 0 0  ?PHQ-9 Score '7 13 21 6 7  '$ ?Difficult doing work/chores  Not difficult at all Somewhat difficult Not difficult at all Somewhat difficult  ? ? ?  06/12/2021  ?  9:30  AM 05/23/2021  ? 10:43 AM 12/09/2020  ?  1:48 PM 10/10/2018  ?  9:25 AM  ?GAD 7 : Generalized Anxiety Score  ?Nervous, Anxious, on Edge '1 1 2 1  '$ ?Control/stop worrying 0 '1 1 1  '$ ?Worry too much - different things '1 2 1 1  '$ ?Trouble relaxing 0 '1 1 1  '$ ?Restless 0 0 0 1  ?Easily annoyed or irritable 0 0 0 0  ?Afraid - awful might happen 0 0 1 2  ?Total GAD 7 Score '2 5 6 7  '$ ?Anxiety Difficulty Somewhat difficult Not difficult at all Somewhat difficult Somewhat difficult  ? ?  ? ?  03/09/2021  ? 11:21 AM 04/13/2021  ? 11:38 AM 05/23/2021  ? 10:43 AM 06/12/2021  ?  9:29 AM 06/12/2021  ?  9:50 AM  ?Fall Risk  ?Falls in the past year? 0 0 0 0 0  ?Was there an injury with Fall? 0 0 0 1 0  ?Fall Risk Category Calculator 0 0 0 1 0  ?Fall Risk Category Low Low Low Low Low  ?Patient Fall Risk Level Low fall risk  Low fall risk Low fall risk Low fall risk  ?Patient at Risk for Falls Due to  No Fall Risks No Fall Risks History of fall(s) No Fall Risks  ?Fall risk Follow up Falls evaluation completed;Falls prevention discussed Falls evaluation completed Falls evaluation completed Falls evaluation completed Falls evaluation completed  ?  ?Functional Status Survey: ?Is the patient deaf or have difficulty hearing?: No ?Does the patient have difficulty seeing, even when wearing glasses/contacts?: No ?Does the patient have difficulty concentrating, remembering, or making decisions?: No ?Does the patient have difficulty walking or climbing stairs?: No ?Does the patient have difficulty dressing or bathing?: No ?Does the patient have difficulty doing errands alone such as visiting a doctor's office or shopping?: No  ? ? ?Past Medical History:  ?Past Medical History:  ?Diagnosis Date  ?? Allergy   ?? Anxiety   ?? Arthritis   ?? Cataract 02/2016  ? Yearly eye exam w/Dr. Ellin Mayhew  ?? Chronic kidney disease   ? 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 Depression   ?? GERD (gastroesophageal reflux disease)   ?? Hypertriglyceridemia   ?? IBS (irritable bowel  syndrome)   ?? IFG (impaired fasting glucose)   ?? Osteopenia   ?? Sleep apnea   ?? Vitamin D deficiency   ? ? ?Surgical History:  ?Past Surgical History:  ?Procedure Laterality Date  ?? bright's surgery    ?? COLONOSCOPY N/A 09/17/2016  ? Procedure: COLONOSCOPY;  Surgeon: Lollie Sails, MD;  Location: [facility] ENDOSCOPY;  Service: Endoscopy;  Laterality: N/A;  ?? COLONOSCOPY WITH PROPOFOL N/A 04/15/2017  ? Procedure: COLONOSCOPY WITH PROPOFOL;  Surgeon: Lollie Sails, MD;  Location: [facility] ENDOSCOPY;  Service: Endoscopy;  Laterality: N/A;  ?? SKIN TAG REMOVAL    ? rectal  ? ? ?Medications:  ?Current Outpatient Medications on File Prior to Visit  ?Medication Sig  ?? busPIRone (BUSPAR) 10 MG tablet Take 1 tablet (10 mg total) by mouth 2 (two) times daily.  ?? Cholecalciferol (VITAMIN D PO) Take 2,000 Units by mouth daily.   ?? Cyanocobalamin (B-12 PO) Take 1,000 mcg by mouth daily.   ?? DULoxetine (CYMBALTA) 60 MG capsule Take 1 capsule (60 mg total) by mouth daily.  ?? omeprazole (PRILOSEC) 20 MG capsule Take 1 capsule (20 mg total) by mouth daily.  ?? ondansetron (ZOFRAN) 4 MG tablet Take 1 tablet (4 mg total) by mouth every 8 (eight) hours as needed for nausea or vomiting.  ?? rosuvastatin (CRESTOR) 10 MG tablet Take 1 tablet (10 mg total) by mouth daily.  ? ?No current facility-administered medications on file prior to visit.  ? ? ?Allergies:  ?Allergies  ?Allergen Reactions  ?? Bactrim [Sulfamethoxazole-Trimethoprim] Nausea And Vomiting  ? ? ?Social History:  ?Social History  ? ?Socioeconomic History  ?? Marital status: Single  ?  Spouse name: Not on file  ?? Number of children: 0  ?? Years of education: Not on file  ?? Highest education level: Associate degree: academic program  ?Occupational History  ?? Occupation: retired  ?  Comment: works part time  ?Tobacco Use  ?? Smoking status: Never  ?? Smokeless tobacco: Never  ?Vaping Use  ?? Vaping Use: Never used  ?Substance and Sexual Activity  ?? Alcohol use: Yes   ?  Alcohol/week: 0.0 standard drinks  ?  Comment: rarely indulge  ?? Drug use: Never  ?? Sexual activity: Not Currently  ?Other Topics Concern  ?? Not on file  ?Social History Narrative  ?? Not on file  ? ?Social Deter

## 2021-06-12 NOTE — Assessment & Plan Note (Addendum)
Chronic, ongoing with poor tolerance to bisphosphonate and oral medications in past.  She would like to try Prolia and would prefer nurse visits for injection, discussed with patient and educated her on medication at length. Consider referral endocrinology to discuss infusions if issues with Prolia. ?

## 2021-06-12 NOTE — Assessment & Plan Note (Addendum)
Ongoing with some muscle aches with daily statin.  Discussed at length risks and benefits of continuing statin.  Will reduce Crestor 10 MG to 3 days a week dosing.  Labs CMP and lipid panel today + CK today. ?

## 2021-06-12 NOTE — Addendum Note (Signed)
Addended by: [name] T on: 06/12/2021 10:55 AM ? ? Modules accepted: Orders ? ?

## 2021-06-12 NOTE — Assessment & Plan Note (Signed)
Wears CPAP 100% of the time, continue this regimen. ?

## 2021-06-12 NOTE — Assessment & Plan Note (Addendum)
Chronic, ongoing.  Continue Prilosec and recommend trial reduction due to osteoporosis, slowly, to discontinuation = if poor tolerance then restart.  Educated on risks of long term use.  Mag level today. ?

## 2021-06-13 DIAGNOSIS — Z20822 Contact with and (suspected) exposure to covid-19: Secondary | ICD-10-CM | POA: Diagnosis not present

## 2021-06-13 LAB — COMPREHENSIVE METABOLIC PANEL
ALT: 23 IU/L (ref 0–32)
AST: 19 IU/L (ref 0–40)
Albumin/Globulin Ratio: 2 (ref 1.2–2.2)
Albumin: 4.5 g/dL (ref 3.8–4.8)
Alkaline Phosphatase: 86 IU/L (ref 44–121)
BUN/Creatinine Ratio: 10 — ABNORMAL LOW (ref 12–28)
BUN: 9 mg/dL (ref 8–27)
Bilirubin Total: 0.6 mg/dL (ref 0.0–1.2)
CO2: 24 mmol/L (ref 20–29)
Calcium: 10.1 mg/dL (ref 8.7–10.3)
Chloride: 105 mmol/L (ref 96–106)
Creatinine, Ser: 0.87 mg/dL (ref 0.57–1.00)
Globulin, Total: 2.3 g/dL (ref 1.5–4.5)
Glucose: 90 mg/dL (ref 70–99)
Potassium: 3.8 mmol/L (ref 3.5–5.2)
Sodium: 142 mmol/L (ref 134–144)
Total Protein: 6.8 g/dL (ref 6.0–8.5)
eGFR: 72 mL/min/{1.73_m2} (ref >59)

## 2021-06-13 LAB — CBC WITH DIFFERENTIAL/PLATELET
Basophils Absolute: 0 10*3/uL (ref 0.0–0.2)
Basos: 1 %
EOS (ABSOLUTE): 0.1 10*3/uL (ref 0.0–0.4)
Eos: 1 %
Hematocrit: 41 % (ref 34.0–46.6)
Hemoglobin: 13.2 g/dL (ref 11.1–15.9)
Immature Grans (Abs): 0 10*3/uL (ref 0.0–0.1)
Immature Granulocytes: 0 %
Lymphocytes Absolute: 1.9 10*3/uL (ref 0.7–3.1)
Lymphs: 33 %
MCH: 29.1 pg (ref 26.6–33.0)
MCHC: 32.2 g/dL (ref 31.5–35.7)
MCV: 90 fL (ref 79–97)
Monocytes Absolute: 0.4 10*3/uL (ref 0.1–0.9)
Monocytes: 7 %
Neutrophils Absolute: 3.2 10*3/uL (ref 1.4–7.0)
Neutrophils: 58 %
Platelets: 202 10*3/uL (ref 150–450)
RBC: 4.54 x10E6/uL (ref 3.77–5.28)
RDW: 13.4 % (ref 11.7–15.4)
WBC: 5.6 10*3/uL (ref 3.4–10.8)

## 2021-06-13 LAB — LIPID PANEL W/O CHOL/HDL RATIO
Cholesterol, Total: 154 mg/dL (ref 100–199)
HDL: 51 mg/dL (ref >39)
LDL Chol Calc (NIH): 71 mg/dL (ref 0–99)
Triglycerides: 192 mg/dL — ABNORMAL HIGH (ref 0–149)
VLDL Cholesterol Cal: 32 mg/dL (ref 5–40)

## 2021-06-13 LAB — CK: Total CK: 43 U/L (ref 32–182)

## 2021-06-13 LAB — TSH: TSH: 2.02 u[IU]/mL (ref 0.450–4.500)

## 2021-06-13 LAB — VITAMIN B12: Vitamin B-12: 1051 pg/mL (ref 232–1245)

## 2021-06-13 LAB — VITAMIN D 25 HYDROXY (VIT D DEFICIENCY, FRACTURES): Vit D, 25-Hydroxy: 30.2 ng/mL (ref 30.0–100.0)

## 2021-06-13 LAB — MAGNESIUM: Magnesium: 2.2 mg/dL (ref 1.6–2.3)

## 2021-06-13 NOTE — Progress Notes (Signed)
Contacted via [place] ? ? ?Good evening Marie Perry, your labs have returned.  Overall these all remain nice and stable.  Cholesterol levels have come down even more this check, but as I mentioned try taking medication 3 days a week to see if we can maintain lower levels.  Overall, great labs.  No changes needed.  Any questions? ?Keep being amazing!!  Thank you for allowing me to participate in your care.  I appreciate you. ?Kindest regards, ?Natilee Gauer

## 2021-06-15 ENCOUNTER — Other Ambulatory Visit: Payer: Self-pay

## 2021-06-15 ENCOUNTER — Other Ambulatory Visit: Payer: Self-pay | Admitting: Nurse Practitioner

## 2021-06-15 ENCOUNTER — Telehealth: Payer: Self-pay

## 2021-06-15 DIAGNOSIS — Z20822 Contact with and (suspected) exposure to covid-19: Secondary | ICD-10-CM | POA: Diagnosis not present

## 2021-06-15 DIAGNOSIS — Z20828 Contact with and (suspected) exposure to other viral communicable diseases: Secondary | ICD-10-CM | POA: Diagnosis not present

## 2021-06-15 MED ORDER — DENOSUMAB 60 MG/ML ~~LOC~~ SOSY
60.0000 mg | PREFILLED_SYRINGE | SUBCUTANEOUS | 1 refills | Status: DC
Start: 2021-06-15 — End: 2021-06-20

## 2021-06-15 NOTE — Telephone Encounter (Signed)
Patient contacted office to request bowel prep change from Suprep due to insurance.  The alternative gallon preps such as Nulytely, Goltytely, Gavilyte she was uncomfortable taking due to severe dehydration and fainting that she had experienced in her past.  Provided her with a sample of ClenPiq and went over instructions with her. ? ?She plans on picking up the sample closer to her colonoscopy date which is in September. ? ?Thanks, ?Sharyn Lull, CMA ?

## 2021-06-16 DIAGNOSIS — Z20828 Contact with and (suspected) exposure to other viral communicable diseases: Secondary | ICD-10-CM | POA: Diagnosis not present

## 2021-06-19 ENCOUNTER — Ambulatory Visit: Payer: Self-pay | Admitting: *Deleted

## 2021-06-19 NOTE — Telephone Encounter (Signed)
Pharmacy says pt. Picks up this medication at the office.  Denosumab (Prolia) 60/mg/ml SOSY injection 1 ml   06/15/2021. ?Inject 60 mg into skin every 6 months subcutaneous. ? ?[id number]  [place], [place].   Rx received 06/15/2021 at 5:25 PM.  [id number]. ? ? ?[facility] calling, states [facility].   This is not the instructions on the new script.   Call [facility] at [phone]. ?Ref # [id number]. ? ? ?Reason for Disposition ? [1] Caller has URGENT medicine question about med that PCP or specialist prescribed AND [2] triager unable to answer question ? ?Answer Assessment - Initial Assessment Questions ?1. NAME of MEDICATION: "What medicine are you calling about?" ?    Prolia 60 mg/ml SOSY injection ?2. QUESTION: "What is your question?" (e.g., double dose of medicine, side effect) ?    [facility] needing clarification on the instructions.  See note in documentation. ?3. PRESCRIBING HCP: "Who prescribed it?" Reason: if prescribed by specialist, call should be referred to that group. ?    [name], NP ?4. SYMPTOMS: "Do you have any symptoms?" ?    N/A ?5. SEVERITY: If symptoms are present, ask "Are they mild, moderate or severe?" ?    N/A ?6. PREGNANCY:  "Is there any chance that you are pregnant?" "When was your last menstrual period?" ?    N/A ? ?Protocols used: Medication Question Call-A-AH ? ?

## 2021-06-20 MED ORDER — DENOSUMAB 60 MG/ML ~~LOC~~ SOSY: 60.0000 mg | PREFILLED_SYRINGE | SUBCUTANEOUS | 1 refills | Status: DC

## 2021-06-20 NOTE — Addendum Note (Signed)
Addended by: [name] T on: 06/20/2021 10:59 AM ? ? Modules accepted: Orders ? ?

## 2021-06-20 NOTE — Telephone Encounter (Signed)
Spoke with pharmacy and they informed me that the information they had on file matched what the provider had sent over. Pharmacist says that they were out of stock at the time of the call of Prolia prescription, but they have it in stock now and they will reach out to the patient when it is ready for pick up.  ?

## 2021-06-20 NOTE — Telephone Encounter (Signed)
Noted  

## 2021-09-05 DIAGNOSIS — H04123 Dry eye syndrome of bilateral lacrimal glands: Secondary | ICD-10-CM | POA: Diagnosis not present

## 2021-09-05 DIAGNOSIS — H0288A Meibomian gland dysfunction right eye, upper and lower eyelids: Secondary | ICD-10-CM | POA: Diagnosis not present

## 2021-09-05 DIAGNOSIS — H0288B Meibomian gland dysfunction left eye, upper and lower eyelids: Secondary | ICD-10-CM | POA: Diagnosis not present

## 2021-09-05 DIAGNOSIS — Z961 Presence of intraocular lens: Secondary | ICD-10-CM | POA: Diagnosis not present

## 2021-09-10 NOTE — Patient Instructions (Signed)
Osteoporosis  Osteoporosis is when the bones get thin and weak. This can cause your bones to break (fracture) more easily. What are the causes? The exact cause of this condition is not known. What increases the risk? Having family members with this condition. Not eating enough healthy foods. Taking certain medicines. Being female. Being age 70 or older. Smoking or using other products that contain nicotine or tobacco, such as e-cigarettes or chewing tobacco. Not exercising. Being of European or Asian ancestry. Having a small body frame. What are the signs or symptoms? A broken bone might be the first sign, especially if the break results from a fall or injury that usually would not cause a bone to break. Other signs and symptoms include: Pain in the neck or low back. Being hunched over (stooped posture). Getting shorter. How is this treated? Eating more foods with more calcium and vitamin D in them. Doing exercises. Stopping tobacco use. Limiting how much alcohol you drink. Taking medicines to slow bone loss or help make the bones stronger. Taking supplements of calcium and vitamin D every day. Taking medicines to replace chemicals in the body (hormone replacement medicines). Monitoring your levels of calcium and vitamin D. The goal of treatment is to strengthen your bones and lower your risk for a bone break. Follow these instructions at home: Eating and drinking Eat plenty of calcium and vitamin D. These nutrients are good for your bones. Good sources of calcium and vitamin D include: Some fish, such as salmon and tuna. Foods that have calcium and vitamin D added to them (fortified foods), such as some breakfast cereals. Egg yolks. Cheese. Liver.  Activity Do exercises as told by your doctor. Ask your doctor what exercises are safe for you. You should do: Exercises that make your muscles work to hold your body weight up (weight-bearing exercises). These include tai chi,  yoga, and walking. Exercises to make your muscles stronger. One example is lifting weights. Lifestyle Do not drink alcohol if: Your doctor tells you not to drink. You are pregnant, may be pregnant, or are planning to become pregnant. If you drink alcohol: Limit how much you use to: 0-1 drink a day for women. 0-2 drinks a day for men. Know how much alcohol is in your drink. In the [place] one drink equals one 12 oz bottle of beer (355 mL), one 5 oz glass of wine (148 mL), or one 1 oz glass of hard liquor (44 mL). Do not smoke or use any products that contain nicotine or tobacco. If you need help quitting, ask your doctor. Preventing falls Use tools to help you move around (mobility aids) as needed. These include canes, walkers, scooters, and crutches. Keep rooms well-lit. Put away things on the floor that could make you trip. These include cords and rugs. Install safety rails on stairs. Install grab bars in bathrooms. Use rubber mats in slippery areas, like bathrooms. Wear shoes that: Fit you well. Support your feet. Have closed toes. Have rubber soles or low heels. Tell your doctor about all of the medicines you are taking. Some medicines can make you more likely to fall. General instructions Take over-the-counter and prescription medicines only as told by your doctor. Keep all follow-up visits. Contact a doctor if: You have not been tested (screened) for osteoporosis and you are: A woman who is age 74 or older. A man who is age 35 or older. Get help right away if: You fall. You get hurt. Summary Osteoporosis happens when your  bones get thin and weak. Weak bones can break (fracture) more easily. Eat plenty of calcium and vitamin D. These are good for your bones. Tell your doctor about all of the medicines that you take. This information is not intended to replace advice given to you by your health care provider. Make sure you discuss any questions you have with your health care  provider. Document Revised: 07/16/2019 Document Reviewed: 07/16/2019 Elsevier Patient Education  2023 [employer] Inc.  

## 2021-09-12 ENCOUNTER — Encounter: Payer: Self-pay | Admitting: Nurse Practitioner

## 2021-09-12 ENCOUNTER — Ambulatory Visit (INDEPENDENT_AMBULATORY_CARE_PROVIDER_SITE_OTHER): Payer: Medicare Other | Admitting: Nurse Practitioner

## 2021-09-12 VITALS — BP 124/76 | HR 68 | Temp 98.5°F | Ht 66.5 in | Wt 195.9 lb

## 2021-09-12 DIAGNOSIS — Z6831 Body mass index (BMI) 31.0-31.9, adult: Secondary | ICD-10-CM

## 2021-09-12 DIAGNOSIS — F332 Major depressive disorder, recurrent severe without psychotic features: Secondary | ICD-10-CM

## 2021-09-12 DIAGNOSIS — E6609 Other obesity due to excess calories: Secondary | ICD-10-CM

## 2021-09-12 DIAGNOSIS — E782 Mixed hyperlipidemia: Secondary | ICD-10-CM | POA: Diagnosis not present

## 2021-09-12 DIAGNOSIS — Z87440 Personal history of urinary (tract) infections: Secondary | ICD-10-CM | POA: Diagnosis not present

## 2021-09-12 DIAGNOSIS — G4733 Obstructive sleep apnea (adult) (pediatric): Secondary | ICD-10-CM | POA: Diagnosis not present

## 2021-09-12 DIAGNOSIS — K219 Gastro-esophageal reflux disease without esophagitis: Secondary | ICD-10-CM

## 2021-09-12 DIAGNOSIS — R8281 Pyuria: Secondary | ICD-10-CM | POA: Diagnosis not present

## 2021-09-12 DIAGNOSIS — M810 Age-related osteoporosis without current pathological fracture: Secondary | ICD-10-CM

## 2021-09-12 LAB — URINALYSIS, ROUTINE W REFLEX MICROSCOPIC
Bilirubin, UA: NEGATIVE
Glucose, UA: NEGATIVE
Ketones, UA: NEGATIVE
Nitrite, UA: NEGATIVE
Protein,UA: NEGATIVE
Specific Gravity, UA: >1.03 — ABNORMAL HIGH (ref 1.005–1.030)
Urobilinogen, Ur: 0.2 mg/dL (ref 0.2–1.0)
pH, UA: 5.5 (ref 5.0–7.5)

## 2021-09-12 LAB — MICROSCOPIC EXAMINATION

## 2021-09-12 MED ORDER — IBANDRONATE SODIUM 150 MG PO TABS: 150.0000 mg | ORAL_TABLET | ORAL | 4 refills | Status: DC

## 2021-09-12 NOTE — Assessment & Plan Note (Signed)
BMI 31.15.  Recommended eating smaller high protein, low fat meals more frequently and exercising 30 mins a day 5 times a week with a goal of 10-15lb weight loss in the next 3 months. Patient voiced their understanding and motivation to adhere to these recommendations.

## 2021-09-12 NOTE — Assessment & Plan Note (Signed)
Chronic, ongoing.  Continue Prilosec every other day, is tolerating -- tried to lower further but had symptoms with this.  Risks of PPI use were discussed with patient including bone loss, C. Diff diarrhea, pneumonia, infections, CKD, electrolyte abnormalities.  Verbalizes understanding and chooses to continue the medication.  Mag level annually.

## 2021-09-12 NOTE — Assessment & Plan Note (Signed)
Ongoing with some muscle aches with daily statin.  Discussed at length risks and benefits of continuing statin.  Tolerating Crestor 10 MG 3 days a week dosing without issue.  Labs: CMP and lipid panel today.

## 2021-09-12 NOTE — Assessment & Plan Note (Signed)
Chronic, ongoing with poor tolerance to Fosamax in past.  She would like to try Prolia, but was too costly >$1000.  Will trial monthly Boniva and see if side effects present with this, she will alert provider. Consider referral endocrinology to discuss infusions if issues with with Boniva.

## 2021-09-12 NOTE — Progress Notes (Signed)
Contacted via [place] morning Romie Minus, your urine did return showing possible infection. I am sending this for culture and will let you know if infection present and if we need to treat.  Any questions?

## 2021-09-12 NOTE — Assessment & Plan Note (Signed)
Chronic, stable.  Wears CPAP 100% of the time, continue this regimen.

## 2021-09-12 NOTE — Assessment & Plan Note (Signed)
Chronic, stable.  Denies SI/HI.  Will continue current medication regimen at this time and adjust as needed.  May consider switch from Cymbalta to Sertraline in future if exacerbation or mood changes.

## 2021-09-12 NOTE — Progress Notes (Signed)
BP 124/76 (BP Location: Left Arm, Patient Position: Sitting, Cuff Size: Normal)   Pulse 68   Temp 98.5 F (36.9 C) (Oral)   Ht 5' 6.5" (1.689 m)   Wt 195 lb 14.4 oz (88.9 kg)   LMP  (LMP Unknown)   SpO2 95%   BMI 31.15 kg/m    Subjective:    Patient ID: Marie Perry, female    DOB: 03-20-1950, 71 y.o.   MRN: [id number]  HPI: Marie Perry is a 71 y.o. female  Chief Complaint  Patient presents with   Hyperlipidemia   Gastroesophageal Reflux   Osteoporosis   Depression   Urinary Tract Infection    Patient says she was recently seen for UTI and says she completed course of treatment and says she never followed up. Patient says she does not know if her UTI ever went away or if she needs to be recheck at today's visit. Patient denies having any symptoms.    HYPERLIPIDEMIA Continues Rosuvastatin every other day. Has CPAP and used 100% of the time.  Hyperlipidemia status: good compliance Satisfied with current treatment?  yes Side effects:  no Medication compliance: good compliance Past cholesterol meds:  Supplements: none Aspirin:  no The 10-year ASCVD risk score (Arnett DK, et al., 2019) is: 8.4%   Values used to calculate the score:     Age: 72 years     Sex: Female     Is Non-Hispanic African American: No     Diabetic: No     Tobacco smoker: No     Systolic Blood Pressure: 146 mmHg     Is BP treated: No     HDL Cholesterol: 51 mg/dL     Total Cholesterol: 154 mg/dL Chest pain:  no Coronary artery disease:  no Family history CAD:  yes Family history early CAD:  no    URINARY SYMPTOMS Was treated for UTI April 2023, would like urine recheck to ensure clearance.  No symptoms. Dysuria: no Urinary frequency: no Urgency: no Small volume voids: no Symptom severity: no Urinary incontinence: no Foul odor: no Hematuria: no Abdominal pain: no Back pain: no Suprapubic pain/pressure: no Flank pain: no Fever:  no Vomiting: no Status:  better Previous urinary tract infection: yes Recurrent urinary tract infection: no Sexual activity: No sexually active Treatments attempted: antibiotics and increasing fluids    OSTEOPOROSIS Last check 2019 DEXA noted with T -2.9 and 01/04/21 with T-score of -3.0.  Tried oral medications (Fosamax), but caused GI issues and GERD.  Tried to get Prolia, but it was going to be too costly. Satisfied with current treatment?: no current treatment Past osteoporosis medications/treatments: Fosamax Adequate calcium & vitamin D: yes Intolerance to bisphosphonates:yes Weight bearing exercises: no    GERD Taking Prilosec 20 MG -- has cut back to every other day per recommendations. GERD control status: stable Satisfied with current treatment? 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  Current medications include Cymbalta 60 MG and Buspar 10 MG BID. Reports she has minimal socialization recently. Mood status: stable Satisfied with current treatment?: yes Symptom severity: moderate  Duration of current  treatment : chronic Side effects: no Medication compliance: good compliance Psychotherapy/counseling: currently with the SW Previous psychiatric medications: Cymbalta and Buspar Depressed mood: occasionally Anxious mood: no Anhedonia: no Significant weight loss or gain: no Insomnia: at times has issues falling asleep Fatigue: no Feelings of worthlessness or guilt: no Impaired concentration/indecisiveness: yes Suicidal ideations: no Hopelessness: no Crying spells: no    09/12/2021   10:38 AM  06/12/2021    9:29 AM 05/23/2021   10:43 AM 04/13/2021   11:39 AM 04/13/2021   11:38 AM  Depression screen PHQ 2/9  Decreased Interest _0 Down, Depressed, Hopeless _1 PHQ - 2 Score _2 Altered sleeping _3 Tired, decreased energy _4 Change in appetite _5 0  Feeling bad or failure about yourself  _6 0  Trouble concentrating _7 0  Moving slowly or fidgety/restless 1 0 0 3 0  Suicidal thoughts 0 0 0 1 0  PHQ-9 Score _8 Difficult doing work/chores   Not difficult at all Somewhat difficult Not difficult at all       09/12/2021   10:39 AM 06/12/2021    9:30 AM 05/23/2021   10:43 AM 12/09/2020    1:48 PM  GAD 7 : Generalized Anxiety Score  Nervous, Anxious, on Edge _9 Control/stop worrying 1 0 1 1  Worry too much - different things _10 Trouble relaxing 1 0 1 1  Restless 0 0 0 0  Easily annoyed or irritable 1 0 0 0  Afraid - awful might happen 1 0 0 1  Total GAD 7 Score _11 Anxiety Difficulty  Somewhat difficult Not difficult at all Somewhat difficult    Relevant past medical, surgical, family and social history reviewed and updated as indicated. Interim medical history since our last visit reviewed. Allergies and medications reviewed and updated.  Review of Systems  Per HPI unless specifically indicated above     Objective:    BP 124/76 (BP Location: Left Arm, Patient Position: Sitting, Cuff Size: Normal)   Pulse 68   Temp 98.5 F (36.9 C) (Oral)   Ht 5' 6.5" (1.689 m)   Wt 195 lb 14.4 oz (88.9 kg)   LMP  (LMP Unknown)   SpO2 95%   BMI 31.15 kg/m   Wt Readings from Last 3 Encounters:  09/12/21 195 lb 14.4 oz (88.9 kg)  06/12/21 191 lb 12.8 oz (87 kg)  05/23/21 193 lb (87.5 kg)    Physical Exam  Results for orders placed or performed in visit on 06/12/21  CBC with Differential/Platelet  Result Value Ref Range   WBC 5.6 3.4 - 10.8 x10E3/uL   RBC 4.54 3.77 - 5.28 x10E6/uL   Hemoglobin 13.2  11.1 - 15.9 g/dL   Hematocrit 41.0 34.0 - 46.6 %   MCV 90 79 - 97 fL   MCH 29.1 26.6 - 33.0 pg   MCHC 32.2 31.5 - 35.7 g/dL   RDW 13.4 11.7 - 15.4 %   Platelets 202 150 - 450 x10E3/uL   Neutrophils 58 Not Estab. %   Lymphs 33 Not Estab. %   Monocytes 7 Not Estab. %   Eos 1 Not Estab. %   Basos 1 Not Estab. %   Neutrophils Absolute 3.2 1.4 -  7.0 x10E3/uL   Lymphocytes Absolute 1.9 0.7 - 3.1 x10E3/uL   Monocytes Absolute 0.4 0.1 - 0.9 x10E3/uL   EOS (ABSOLUTE) 0.1 0.0 - 0.4 x10E3/uL   Basophils Absolute 0.0 0.0 - 0.2 x10E3/uL   Immature Granulocytes 0 Not Estab. %   Immature Grans (Abs) 0.0 0.0 - 0.1 x10E3/uL  Comprehensive metabolic panel  Result Value Ref Range   Glucose 90 70 - 99 mg/dL   BUN 9 8 - 27 mg/dL   Creatinine, Ser 0.87 0.57 - 1.00 mg/dL   eGFR 72 >59 mL/min/1.73   BUN/Creatinine Ratio 10 (L) 12 - 28   Sodium 142 134 - 144 mmol/L   Potassium 3.8 3.5 - 5.2 mmol/L   Chloride 105 96 - 106 mmol/L   CO2 24 20 - 29 mmol/L   Calcium 10.1 8.7 - 10.3 mg/dL   Total Protein 6.8 6.0 - 8.5 g/dL   Albumin 4.5 3.8 - 4.8 g/dL   Globulin, Total 2.3 1.5 - 4.5 g/dL   Albumin/Globulin Ratio 2.0 1.2 - 2.2   Bilirubin Total 0.6 0.0 - 1.2 mg/dL   Alkaline Phosphatase 86 44 - 121 IU/L   AST 19 0 - 40 IU/L   ALT 23 0 - 32 IU/L  Lipid Panel w/o Chol/HDL Ratio  Result Value Ref Range   Cholesterol, Total 154 100 - 199 mg/dL   Triglycerides 192 (H) 0 - 149 mg/dL   HDL 51 >39 mg/dL   VLDL Cholesterol Cal 32 5 - 40 mg/dL   LDL Chol Calc (NIH) 71 0 - 99 mg/dL  TSH  Result Value Ref Range   TSH 2.020 0.450 - 4.500 uIU/mL  VITAMIN D 25 Hydroxy (Vit-D Deficiency, Fractures)  Result Value Ref Range   Vit D, 25-Hydroxy 30.2 30.0 - 100.0 ng/mL  Vitamin B12  Result Value Ref Range   Vitamin B-12 1,051 232 - 1,245 pg/mL  Magnesium  Result Value Ref Range   Magnesium 2.2 1.6 - 2.3 mg/dL  CK  Result Value Ref Range   Total CK 43 32 - 182 U/L      Assessment & Plan:   Problem List  Items Addressed This Visit       Respiratory   Sleep apnea    Chronic, stable.  Wears CPAP 100% of the time, continue this regimen.        Digestive   GERD (gastroesophageal reflux disease)    Chronic, ongoing.  Continue Prilosec every other day, is tolerating -- tried to lower further but had symptoms with this.  Risks of PPI use were discussed with patient including bone loss, C. Diff diarrhea, pneumonia, infections, CKD, electrolyte abnormalities.  Verbalizes understanding and chooses to continue the medication.  Mag level annually.         Musculoskeletal and Integument   Osteoporosis    Chronic, ongoing with poor tolerance to Fosamax in past.  She would like to try Prolia, but was too costly >$1000.  Will trial monthly Boniva and see if side effects present with this, she will alert provider. Consider referral endocrinology to discuss infusions if issues with with Boniva.      Relevant Medications   ibandronate (BONIVA) 150 MG tablet     Other   Mixed hyperlipidemia    Ongoing with some muscle aches with daily statin.  Discussed at length risks and benefits of continuing statin.  Tolerating Crestor 10 MG 3 days a week dosing without issue.  Labs: CMP and lipid panel today.  Relevant Orders   Comprehensive metabolic panel   Lipid Panel w/o Chol/HDL Ratio   Obesity    BMI 31.15.  Recommended eating smaller high protein, low fat meals more frequently and exercising 30 mins a day 5 times a week with a goal of 10-15lb weight loss in the next 3 months. Patient voiced their understanding and motivation to adhere to these recommendations.       Recurrent major depression-severe (HCC) - Primary    Chronic, stable.  Denies SI/HI.  Will continue current medication regimen at this time and adjust as needed.  May consider switch from Cymbalta to Sertraline in future if exacerbation or mood changes.        Other Visit Diagnoses     History of UTI       Recheck urine today and  treat as needed.   Relevant Orders   Urinalysis, Routine w reflex microscopic        Follow up plan: Return in about 9 months (around 06/14/2022) for Annual Physical -- after 06/13/22.

## 2021-09-12 NOTE — Addendum Note (Signed)
Addended by: [name] T on: 09/12/2021 12:08 PM   Modules accepted: Orders

## 2021-09-13 DIAGNOSIS — R8281 Pyuria: Secondary | ICD-10-CM | POA: Diagnosis not present

## 2021-09-13 LAB — COMPREHENSIVE METABOLIC PANEL
ALT: 20 IU/L (ref 0–32)
AST: 19 IU/L (ref 0–40)
Albumin/Globulin Ratio: 2 (ref 1.2–2.2)
Albumin: 4.5 g/dL (ref 3.9–4.9)
Alkaline Phosphatase: 88 IU/L (ref 44–121)
BUN/Creatinine Ratio: 20 (ref 12–28)
BUN: 19 mg/dL (ref 8–27)
Bilirubin Total: 0.5 mg/dL (ref 0.0–1.2)
CO2: 23 mmol/L (ref 20–29)
Calcium: 9.9 mg/dL (ref 8.7–10.3)
Chloride: 107 mmol/L — ABNORMAL HIGH (ref 96–106)
Creatinine, Ser: 0.95 mg/dL (ref 0.57–1.00)
Globulin, Total: 2.2 g/dL (ref 1.5–4.5)
Glucose: 104 mg/dL — ABNORMAL HIGH (ref 70–99)
Potassium: 4.4 mmol/L (ref 3.5–5.2)
Sodium: 143 mmol/L (ref 134–144)
Total Protein: 6.7 g/dL (ref 6.0–8.5)
eGFR: 64 mL/min/{1.73_m2} (ref >59)

## 2021-09-13 LAB — LIPID PANEL W/O CHOL/HDL RATIO
Cholesterol, Total: 178 mg/dL (ref 100–199)
HDL: 49 mg/dL (ref >39)
LDL Chol Calc (NIH): 85 mg/dL (ref 0–99)
Triglycerides: 270 mg/dL — ABNORMAL HIGH (ref 0–149)
VLDL Cholesterol Cal: 44 mg/dL — ABNORMAL HIGH (ref 5–40)

## 2021-09-13 NOTE — Progress Notes (Signed)
Contacted via MyChart   Good evening Marie Perry, your labs have returned and overall these remain stable.  No medication changes needed.  Keep up the great work!! Keep being stellar!!  Thank you for allowing me to participate in your care.  I appreciate you. Kindest regards, Siddiq Kaluzny

## 2021-09-15 ENCOUNTER — Other Ambulatory Visit: Payer: Self-pay | Admitting: Nurse Practitioner

## 2021-09-15 MED ORDER — AMOXICILLIN-POT CLAVULANATE 875-125 MG PO TABS: 1.0000 | ORAL_TABLET | Freq: Two times a day (BID) | ORAL | 0 refills | Status: AC

## 2021-09-16 LAB — URINE CULTURE

## 2021-09-17 NOTE — Progress Notes (Signed)
Contacted via [place] evening Romie Minus, your urine is susceptible to the Augmentin I sent in, please complete this course:)

## 2021-10-04 ENCOUNTER — Telehealth: Payer: Self-pay

## 2021-10-04 NOTE — Telephone Encounter (Signed)
Patient has requested to cancel her colonoscopy 10/20/21 with Dr. Marius Ditch due to taking care of her mother. Trish notified of cancellation.  If we still have samples available when she reschedules-I will provide her with one.  Thanks, [place], [place]

## 2021-10-20 ENCOUNTER — Encounter: Admission: RE | Payer: Self-pay | Source: Home / Self Care

## 2021-10-20 ENCOUNTER — Ambulatory Visit: Admission: RE | Admit: 2021-10-20 | Payer: Medicare Other | Source: Home / Self Care | Admitting: Gastroenterology

## 2021-10-20 SURGERY — COLONOSCOPY WITH PROPOFOL
Anesthesia: General

## 2021-11-08 DIAGNOSIS — D8481 Immunodeficiency due to conditions classified elsewhere: Secondary | ICD-10-CM | POA: Diagnosis not present

## 2021-11-08 DIAGNOSIS — Z832 Family history of diseases of the blood and blood-forming organs and certain disorders involving the immune mechanism: Secondary | ICD-10-CM | POA: Diagnosis not present

## 2021-11-08 DIAGNOSIS — K5190 Ulcerative colitis, unspecified, without complications: Secondary | ICD-10-CM | POA: Diagnosis not present

## 2021-11-27 DIAGNOSIS — Z23 Encounter for immunization: Secondary | ICD-10-CM | POA: Diagnosis not present

## 2021-12-04 DIAGNOSIS — Z23 Encounter for immunization: Secondary | ICD-10-CM | POA: Diagnosis not present

## 2021-12-05 ENCOUNTER — Telehealth: Payer: Self-pay | Admitting: Nurse Practitioner

## 2021-12-05 DIAGNOSIS — S8991XA Unspecified injury of right lower leg, initial encounter: Secondary | ICD-10-CM

## 2021-12-05 NOTE — Telephone Encounter (Signed)
Copied from [place] [id number]. Topic: Referral - Request for Referral >> Dec 05, 2021 11:35 AM Sabas Sous wrote: Has patient seen PCP for this complaint? No. *If NO, is insurance requiring patient see PCP for this issue before PCP can refer them? Referral for which specialty: Ortho.  Preferred provider/office: Highest recommended within insurance network and closest proximity.  Reason for referral: Knee injury, feels like a pull/tear just below her knee.

## 2021-12-05 NOTE — Telephone Encounter (Signed)
Patient notified of referral being placed.  

## 2021-12-14 DIAGNOSIS — M25561 Pain in right knee: Secondary | ICD-10-CM | POA: Diagnosis not present

## 2021-12-14 DIAGNOSIS — M1711 Unilateral primary osteoarthritis, right knee: Secondary | ICD-10-CM | POA: Diagnosis not present

## 2022-01-17 ENCOUNTER — Telehealth: Payer: Self-pay | Admitting: *Deleted

## 2022-01-17 ENCOUNTER — Other Ambulatory Visit: Payer: Self-pay | Admitting: *Deleted

## 2022-01-17 ENCOUNTER — Other Ambulatory Visit: Payer: Self-pay | Admitting: Nurse Practitioner

## 2022-01-17 DIAGNOSIS — Z86010 Personal history of colonic polyps: Secondary | ICD-10-CM

## 2022-01-17 DIAGNOSIS — Z1231 Encounter for screening mammogram for malignant neoplasm of breast: Secondary | ICD-10-CM

## 2022-01-17 NOTE — Telephone Encounter (Signed)
Patient called and stated that she is ready to reschedule her colonoscopy.  She canceled her last colonoscopy on 10/20/2021 due family issues.  Patient have reschedule on 03/07/2022.

## 2022-02-01 ENCOUNTER — Ambulatory Visit
Admission: RE | Admit: 2022-02-01 | Discharge: 2022-02-01 | Disposition: A | Discharge status: Home / Self Care | Payer: Medicare Other | Source: Ambulatory Visit | Attending: Nurse Practitioner | Admitting: Nurse Practitioner

## 2022-02-01 DIAGNOSIS — Z1231 Encounter for screening mammogram for malignant neoplasm of breast: Secondary | ICD-10-CM | POA: Insufficient documentation

## 2022-02-02 NOTE — Progress Notes (Signed)
Contacted via MyChart   Normal mammogram, may repeat in one year:)

## 2022-02-22 ENCOUNTER — Telehealth: Payer: Self-pay | Admitting: Gastroenterology

## 2022-02-22 NOTE — Telephone Encounter (Signed)
Spoken to patient and called to confirm to cancel due having issues dealing with her mother.

## 2022-02-22 NOTE — Telephone Encounter (Signed)
Patient called to cancel colonoscopy. States she will call back to reschedule on a later date.

## 2022-03-02 ENCOUNTER — Encounter: Admission: RE | Payer: Self-pay | Source: Ambulatory Visit

## 2022-03-02 ENCOUNTER — Ambulatory Visit: Admission: RE | Admit: 2022-03-02 | Payer: Medicare Other | Source: Ambulatory Visit | Admitting: Gastroenterology

## 2022-03-02 SURGERY — COLONOSCOPY WITH PROPOFOL
Anesthesia: General

## 2022-04-10 ENCOUNTER — Ambulatory Visit (INDEPENDENT_AMBULATORY_CARE_PROVIDER_SITE_OTHER): Payer: Medicare Other

## 2022-04-10 VITALS — Ht 66.5 in | Wt 195.0 lb

## 2022-04-10 DIAGNOSIS — Z0000 Encounter for general adult medical examination without abnormal findings: Secondary | ICD-10-CM | POA: Diagnosis not present

## 2022-04-10 NOTE — Patient Instructions (Signed)
Marie Perry , Thank you for taking time to come for your Medicare Wellness Visit. I appreciate your ongoing commitment to your health goals. Please review the following plan we discussed and let me know if I can assist you in the future.   These are the goals we discussed:  Goals      DIET - EAT MORE FRUITS AND VEGETABLES     DIET - INCREASE WATER INTAKE     Recommend drinking at least 6-8 glasses of water a day     Patient Stated     03/07/2020, trying to eat healthier     Weight (lb) < 200 lb (90.7 kg)     Pt would like to lose 10 lbs over the next year      Weight (lb) < 200 lb (90.7 kg)        This is a list of the screening recommended for you and due dates:  Health Maintenance  Topic Date Due   Colon Cancer Screening  04/15/2020   COVID-19 Vaccine (4 - 2023-24 season) 10/13/2021   Zoster (Shingles) Vaccine (2 of 2) 12/17/2021   DEXA scan (bone density measurement)  01/05/2023   Mammogram  02/02/2023   Medicare Annual Wellness Visit  04/11/2023   DTaP/Tdap/Td vaccine (2 - Td or Tdap) 10/06/2025   Pneumonia Vaccine  Completed   Flu Shot  Completed   Hepatitis C Screening: USPSTF Recommendation to screen - Ages 18-79 yo.  Completed   HPV Vaccine  Aged Out    Advanced directives: yes  Conditions/risks identified: no  Next appointment: Follow up in one year for your annual wellness visit 04/16/23 @ 10:00 am by phone   Preventive Care 65 Years and Older, Female Preventive care refers to lifestyle choices and visits with your health care provider that can promote health and wellness. What does preventive care include? A yearly physical exam. This is also called an annual well check. Dental exams once or twice a year. Routine eye exams. Ask your health care provider how often you should have your eyes checked. Personal lifestyle choices, including: Daily care of your teeth and gums. Regular physical activity. Eating a healthy diet. Avoiding tobacco and drug  use. Limiting alcohol use. Practicing safe sex. Taking low-dose aspirin every day. Taking vitamin and mineral supplements as recommended by your health care provider. What happens during an annual well check? The services and screenings done by your health care provider during your annual well check will depend on your age, overall health, lifestyle risk factors, and family history of disease. Counseling  Your health care provider may ask you questions about your: Alcohol use. Tobacco use. Drug use. Emotional well-being. Home and relationship well-being. Sexual activity. Eating habits. History of falls. Memory and ability to understand (cognition). Work and work Statistician. Reproductive health. Screening  You may have the following tests or measurements: Height, weight, and BMI. Blood pressure. Lipid and cholesterol levels. These may be checked every 5 years, or more frequently if you are over 8 years old. Skin check. Lung cancer screening. You may have this screening every year starting at age 36 if you have a 30-pack-year history of smoking and currently smoke or have quit within the past 15 years. Fecal occult blood test (FOBT) of the stool. You may have this test every year starting at age 39. Flexible sigmoidoscopy or colonoscopy. You may have a sigmoidoscopy every 5 years or a colonoscopy every 10 years starting at age 57. Hepatitis C blood test.  Hepatitis B blood test. Sexually transmitted disease (STD) testing. Diabetes screening. This is done by checking your blood sugar (glucose) after you have not eaten for a while (fasting). You may have this done every 1-3 years. Bone density scan. This is done to screen for osteoporosis. You may have this done starting at age 70. Mammogram. This may be done every 1-2 years. Talk to your health care provider about how often you should have regular mammograms. Talk with your health care provider about your test results, treatment  options, and if necessary, the need for more tests. Vaccines  Your health care provider may recommend certain vaccines, such as: Influenza vaccine. This is recommended every year. Tetanus, diphtheria, and acellular pertussis (Tdap, Td) vaccine. You may need a Td booster every 10 years. Zoster vaccine. You may need this after age 33. Pneumococcal 13-valent conjugate (PCV13) vaccine. One dose is recommended after age 52. Pneumococcal polysaccharide (PPSV23) vaccine. One dose is recommended after age 67. Talk to your health care provider about which screenings and vaccines you need and how often you need them. This information is not intended to replace advice given to you by your health care provider. Make sure you discuss any questions you have with your health care provider. Document Released: 02/25/2015 Document Revised: 10/19/2015 Document Reviewed: 11/30/2014 Elsevier Interactive Patient Education  2017 [place] Prevention in the Home Falls can cause injuries. They can happen to people of all ages. There are many things you can do to make your home safe and to help prevent falls. What can I do on the outside of my home? Regularly fix the edges of walkways and driveways and fix any cracks. Remove anything that might make you trip as you walk through a door, such as a raised step or threshold. Trim any bushes or trees on the path to your home. Use bright outdoor lighting. Clear any walking paths of anything that might make someone trip, such as rocks or tools. Regularly check to see if handrails are loose or broken. Make sure that both sides of any steps have handrails. Any raised decks and porches should have guardrails on the edges. Have any leaves, snow, or ice cleared regularly. Use sand or salt on walking paths during winter. Clean up any spills in your garage right away. This includes oil or grease spills. What can I do in the bathroom? Use night lights. Install grab  bars by the toilet and in the tub and shower. Do not use towel bars as grab bars. Use non-skid mats or decals in the tub or shower. If you need to sit down in the shower, use a plastic, non-slip stool. Keep the floor dry. Clean up any water that spills on the floor as soon as it happens. Remove soap buildup in the tub or shower regularly. Attach bath mats securely with double-sided non-slip rug tape. Do not have throw rugs and other things on the floor that can make you trip. What can I do in the bedroom? Use night lights. Make sure that you have a light by your bed that is easy to reach. Do not use any sheets or blankets that are too big for your bed. They should not hang down onto the floor. Have a firm chair that has side arms. You can use this for support while you get dressed. Do not have throw rugs and other things on the floor that can make you trip. What can I do in the kitchen? Clean up  any spills right away. Avoid walking on wet floors. Keep items that you use a lot in easy-to-reach places. If you need to reach something above you, use a strong step stool that has a grab bar. Keep electrical cords out of the way. Do not use floor polish or wax that makes floors slippery. If you must use wax, use non-skid floor wax. Do not have throw rugs and other things on the floor that can make you trip. What can I do with my stairs? Do not leave any items on the stairs. Make sure that there are handrails on both sides of the stairs and use them. Fix handrails that are broken or loose. Make sure that handrails are as long as the stairways. Check any carpeting to make sure that it is firmly attached to the stairs. Fix any carpet that is loose or worn. Avoid having throw rugs at the top or bottom of the stairs. If you do have throw rugs, attach them to the floor with carpet tape. Make sure that you have a light switch at the top of the stairs and the bottom of the stairs. If you do not have them,  ask someone to add them for you. What else can I do to help prevent falls? Wear shoes that: Do not have high heels. Have rubber bottoms. Are comfortable and fit you well. Are closed at the toe. Do not wear sandals. If you use a stepladder: Make sure that it is fully opened. Do not climb a closed stepladder. Make sure that both sides of the stepladder are locked into place. Ask someone to hold it for you, if possible. Clearly mark and make sure that you can see: Any grab bars or handrails. First and last steps. Where the edge of each step is. Use tools that help you move around (mobility aids) if they are needed. These include: Canes. Walkers. Scooters. Crutches. Turn on the lights when you go into a dark area. Replace any light bulbs as soon as they burn out. Set up your furniture so you have a clear path. Avoid moving your furniture around. If any of your floors are uneven, fix them. If there are any pets around you, be aware of where they are. Review your medicines with your doctor. Some medicines can make you feel dizzy. This can increase your chance of falling. Ask your doctor what other things that you can do to help prevent falls. This information is not intended to replace advice given to you by your health care provider. Make sure you discuss any questions you have with your health care provider. Document Released: 11/25/2008 Document Revised: 07/07/2015 Document Reviewed: 03/05/2014 Elsevier Interactive Patient Education  2017 [employer].

## 2022-04-10 NOTE — Progress Notes (Signed)
I connected with  Marie Perry on 04/10/22 by a audio enabled telemedicine application and verified that I am speaking with the correct person using two identifiers.  Patient Location: Home  Provider Location: Office/Clinic  I discussed the limitations of evaluation and management by telemedicine. The patient expressed understanding and agreed to proceed.  Subjective:   Marie Perry is a 72 y.o. female who presents for Medicare Annual (Subsequent) preventive examination.  Review of Systems     Cardiac Risk Factors include: advanced age (>25mn, >>47women);hypertension;sedentary lifestyle     Objective:    Today's Vitals   04/10/22 1132  PainSc: 4    There is no height or weight on file to calculate BMI.     04/10/2022   11:37 AM 03/09/2021   11:36 AM 03/07/2020   10:32 AM 02/27/2018   10:32 AM 04/15/2017    9:14 AM 02/22/2017   10:20 AM 09/17/2016    9:28 AM  Advanced Directives  Does Patient Have a Medical Advance Directive? Yes Yes Yes Yes No No No  Type of AParamedicof [place] will Healthcare Power of [place]Living will H[place]Living will     Does patient want to make changes to medical advance directive? No - Patient declined        Copy of [place] Chart? Yes - validated most recent copy scanned in chart (See row information) Yes - validated most recent copy scanned in chart (See row information) Yes - validated most recent copy scanned in chart (See row information) Yes - validated most recent copy scanned in chart (See row information)     Would patient like information on creating a medical advance directive?      Yes (MAU/Ambulatory/Procedural Areas - Information given)     Current Medications (verified) Outpatient Encounter Medications as of 04/10/2022  Medication Sig   busPIRone (BUSPAR) 10 MG tablet Take 1 tablet (10 mg total) by mouth 2 (two)  times daily.   Cholecalciferol (VITAMIN D PO) Take 2,000 Units by mouth daily.    Cyanocobalamin (B-12 PO) Take 1,000 mcg by mouth daily.    DULoxetine (CYMBALTA) 60 MG capsule Take 1 capsule (60 mg total) by mouth daily.   ibandronate (BONIVA) 150 MG tablet Take 1 tablet (150 mg total) by mouth every 30 (thirty) days. Take in the morning with a full glass of water, on an empty stomach, and do not take anything else by mouth or lie down for the next 30 min.   rosuvastatin (CRESTOR) 10 MG tablet Take 1 tablet (10 mg total) by mouth daily.   omeprazole (PRILOSEC) 20 MG capsule Take 1 capsule (20 mg total) by mouth daily. (Patient not taking: Reported on 04/10/2022)   ondansetron (ZOFRAN) 4 MG tablet Take 1 tablet (4 mg total) by mouth every 8 (eight) hours as needed for nausea or vomiting. (Patient not taking: Reported on 04/10/2022)   No facility-administered encounter medications on file as of 04/10/2022.    Allergies (verified) Bactrim [sulfamethoxazole-trimethoprim]   History: Past Medical History:  Diagnosis Date   Allergy    Anxiety    Arthritis    Cataract 02/2016   Yearly eye exam w/Dr. 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"   Depression    GERD (gastroesophageal reflux disease)    Hypertriglyceridemia    IBS (irritable bowel syndrome)    IFG (impaired fasting  glucose)    Osteopenia    Sleep apnea    Vitamin D deficiency    Past Surgical History:  Procedure Laterality Date   bright's surgery     COLONOSCOPY N/A 09/17/2016   Procedure: COLONOSCOPY;  Surgeon: Lollie Sails, MD;  Location: [facility] ENDOSCOPY;  Service: Endoscopy;  Laterality: N/A;   COLONOSCOPY WITH PROPOFOL N/A 04/15/2017   Procedure: COLONOSCOPY WITH PROPOFOL;  Surgeon: Lollie Sails, MD;  Location: [facility] ENDOSCOPY;  Service: Endoscopy;  Laterality: N/A;   SKIN TAG REMOVAL     rectal   Family History  Problem Relation Age of Onset   Mental illness Mother    Depression  Mother    Anxiety disorder Mother    Hearing loss Mother    Miscarriages / [place] Mother    Vision loss Mother    Dementia Mother    Parkinson's disease Mother    Stroke Father    Heart disease Father    Hearing loss Father    Vision loss Father    Cancer Maternal Grandmother        ovarian   Anemia Maternal Grandmother    Arthritis Maternal Grandmother    Lung disease Maternal Grandfather    Birth defects Maternal Grandfather    Asthma Maternal Grandfather    Depression Sister    Fibromyalgia Brother    GER disease Brother    Heart disease Paternal Grandmother    Birth defects Paternal Grandfather    Depression Brother    Asthma Paternal Uncle    Birth defects Sister    Early death Sister    Cancer Maternal Uncle    Hearing loss Maternal Uncle    Kidney disease Maternal Uncle    Depression Brother    Early death Paternal Uncle    Hearing loss Maternal Aunt    Vision loss Maternal Aunt    Varicose Veins Maternal Aunt    Heart disease Paternal Uncle    Stroke Paternal Uncle    Breast cancer Neg Hx    Social History   Socioeconomic History   Marital status: Single    Spouse name: Not on file   Number of children: 0   Years of education: Not on file   Highest education level: Associate degree: academic program  Occupational History   Occupation: retired    Comment: works part time  Tobacco Use   Smoking status: Never   Smokeless tobacco: Never  Vaping Use   Vaping Use: Never used  Substance and Sexual Activity   Alcohol use: Yes    Alcohol/week: 0.0 standard drinks of alcohol    Comment: rarely indulge   Drug use: Never   Sexual activity: Not Currently  Other Topics Concern   Not on file  Social History Narrative   Not on file   Social Determinants of Health   Financial Resource Strain: Low Risk  (04/10/2022)   Overall Financial Resource Strain (CARDIA)    Difficulty of Paying Living Expenses: Not hard at all  Food Insecurity: No Food Insecurity  (04/10/2022)   Hunger Vital Sign    Worried About Running Out of Food in the Last Year: Never true    Ran Out of Food in the Last Year: Never true  Transportation Needs: No Transportation Needs (04/10/2022)   PRAPARE - Hydrologist (Medical): No    Lack of Transportation (Non-Medical): No  Physical Activity: Inactive (04/10/2022)   Exercise Vital Sign    Days of  Exercise per Week: 0 days    Minutes of Exercise per Session: 0 min  Stress: Stress Concern Present (04/10/2022)   [place]    Feeling of Stress : To some extent  Social Connections: Moderately Isolated (04/10/2022)   Social Connection and Isolation Panel [NHANES]    Frequency of Communication with Friends and Family: More than three times a week    Frequency of Social Gatherings with Friends and Family: Once a week    Attends Religious Services: More than 4 times per year    Active Member of [employer] or Organizations: No    Attends Music therapist: Never    Marital Status: Divorced    Tobacco Counseling Counseling given: Not Answered   Clinical Intake:  Pre-visit preparation completed: Yes  Pain : 0-10 Pain Score: 4  Pain Type: Chronic pain Pain Location: Shoulder Pain Orientation: Right     Diabetes: No  How often do you need to have someone help you when you read instructions, pamphlets, or other written materials from your doctor or pharmacy?: 1 - Never  Diabetic?no  Interpreter Needed?: No  Information entered by :: Kirke Shaggy, LPN   Activities of Daily Living    04/10/2022   11:39 AM 04/04/2022   11:27 AM  In your present state of health, do you have any difficulty performing the following activities:  Hearing? 0 0  Vision? 0 0  Difficulty concentrating or making decisions? 1 1  Walking or climbing stairs? 0 0  Dressing or bathing? 0 0  Doing errands, shopping? 0 0  Preparing Food and  eating ? N N  Using the Toilet? N N  In the past six months, have you accidently leaked urine? Y Y  Do you have problems with loss of bowel control? N N  Managing your Medications? N N  Managing your Finances? N N  Housekeeping or managing your Housekeeping? N N    Patient Care Team: Venita Lick, NP as PCP - General (Nurse Practitioner) Rebekah Chesterfield, LCSW as Social Worker (Licensed Clinical Social Worker)  Indicate any recent [employer] you may have received from other than [facility] providers in the past year (date may be approximate).     Assessment:   This is a routine wellness examination for Chole.  Hearing/Vision screen Hearing Screening - Comments:: No aids Vision Screening - Comments:: Readers- Dr.Woodard  Dietary issues and exercise activities discussed: Current Exercise Habits: The patient does not participate in regular exercise at present   Goals Addressed             This Visit's Progress    DIET - EAT MORE FRUITS AND VEGETABLES         Depression Screen    04/10/2022   11:36 AM 09/12/2021   10:38 AM 06/12/2021    9:29 AM 05/23/2021   10:43 AM 04/13/2021   11:39 AM 04/13/2021   11:38 AM 03/09/2021   11:23 AM  PHQ 2/9 Scores  PHQ - 2 Score '1 2 2 4 4 2 2  '$ PHQ- 9 Score '3 9 7 13 21 6 7    '$ Fall Risk    04/10/2022   11:38 AM 04/04/2022   11:27 AM 06/12/2021    9:50 AM 06/12/2021    9:29 AM 05/23/2021   10:43 AM  Fall Risk   Falls in the past year? 1 1 0 0 0  Number falls in past yr: 0 0  0 0 0  Injury with Fall? 0 1 0 1 0  Risk for fall due to : History of fall(s)  No Fall Risks History of fall(s) No Fall Risks  Follow up Falls evaluation completed;Falls prevention discussed  Falls evaluation completed Falls evaluation completed Falls evaluation completed    FALL RISK PREVENTION PERTAINING TO THE HOME:  Any stairs in or around the home? Yes  If so, are there any without handrails? No  Home free of loose throw rugs in walkways, pet beds,  electrical cords, etc? Yes  Adequate lighting in your home to reduce risk of falls? Yes   ASSISTIVE DEVICES UTILIZED TO PREVENT FALLS:  Life alert? No  Use of a cane, walker or w/c? No  Grab bars in the bathroom? Yes  Shower chair or bench in shower? Yes  Elevated toilet seat or a handicapped toilet? No    Cognitive Function:        04/10/2022   11:43 AM 03/07/2020   10:39 AM 02/27/2018   10:47 AM 02/22/2017   10:23 AM  6CIT Screen  What Year? 0 points 0 points 0 points 0 points  What month? 0 points 0 points 0 points 0 points  What time? 0 points 0 points 0 points 0 points  Count back from 20 0 points 0 points 0 points 0 points  Months in reverse 0 points 2 points 0 points 0 points  Repeat phrase 0 points 4 points 0 points 0 points  Total Score 0 points 6 points 0 points 0 points    Immunizations Immunization History  Administered Date(s) Administered   Fluad Quad(high Dose 65+) 12/04/2021   Influenza Inj Mdck Quad Pf 11/28/2017   Influenza, High Dose Seasonal PF 11/09/2016, 11/23/2020   Influenza-Unspecified 11/22/2014, 12/12/2015, 01/07/2017, 11/28/2017, 12/16/2018, 12/04/2021   PFIZER(Purple Top)SARS-COV-2 Vaccination 04/01/2019, 04/22/2019   Pfizer Covid-19 Vaccine Bivalent Booster 20yr & up 11/03/2020   Pneumococcal Conjugate-13 02/22/2017   Pneumococcal Polysaccharide-23 02/27/2018   Tdap 10/07/2015   Zoster Recombinat (Shingrix) 10/22/2021   Zoster, Live 03/06/2013    TDAP status: Up to date  Flu Vaccine status: Up to date  Pneumococcal vaccine status: Up to date  Covid-19 vaccine status: Completed vaccines  Qualifies for Shingles Vaccine? Yes   Zostavax completed Yes   Shingrix Completed?: No.    Education has been provided regarding the importance of this vaccine. Patient has been advised to call insurance company to determine out of pocket expense if they have not yet received this vaccine. Advised may also receive vaccine at local pharmacy or Health  Dept. Verbalized acceptance and understanding.  Screening Tests Health Maintenance  Topic Date Due   COLONOSCOPY (Pts [phone] coverage will need to be confirmed)  04/15/2020   COVID-19 Vaccine (4 - 2023-24 season) 10/13/2021   Zoster Vaccines- Shingrix (2 of 2) 12/17/2021   DEXA SCAN  01/05/2023   MAMMOGRAM  02/02/2023   Medicare Annual Wellness (AWV)  04/11/2023   DTaP/Tdap/Td (2 - Td or Tdap) 10/06/2025   Pneumonia Vaccine 72Years old  Completed   INFLUENZA VACCINE  Completed   Hepatitis C Screening  Completed   HPV VACCINES  Aged Out    Health Maintenance  Health Maintenance Due  Topic Date Due   COLONOSCOPY (Pts [phone] coverage will need to be confirmed)  04/15/2020   COVID-19 Vaccine (4 - 2023-24 season) 10/13/2021   Zoster Vaccines- Shingrix (2 of 2) 12/17/2021    Colorectal cancer screening: Type of screening: Colonoscopy.  Completed 04/15/17. Repeat every 3 years- will reschedule appointment  Mammogram status: Completed 02/01/22. Repeat every year  Bone Density status: Completed 01/04/21. Results reflect: Bone density results: OSTEOPOROSIS. Repeat every 2 years.  Lung Cancer Screening: (Low Dose CT Chest recommended if Age 64-80 years, 30 pack-year currently smoking OR have quit w/in 15years.) does not qualify.    Additional Screening:  Hepatitis C Screening: does qualify; Completed 02/22/17  Vision Screening: Recommended annual ophthalmology exams for early detection of glaucoma and other disorders of the eye. Is the patient up to date with their annual eye exam?  Yes  Who is the provider or what is the name of the office in which the patient attends annual eye exams? Dr.Woodard  If pt is not established with a provider, would they like to be referred to a provider to establish care? No .   Dental Screening: Recommended annual dental exams for proper oral hygiene  Community Resource Referral / Chronic Care Management: CRR required this  visit?  No   CCM required this visit?  No      Plan:     I have personally reviewed and noted the following in the patient's chart:   Medical and social history Use of alcohol, tobacco or illicit drugs  Current medications and supplements including opioid prescriptions. Patient is not currently taking opioid prescriptions. Functional ability and status Nutritional status Physical activity Advanced directives List of other physicians Hospitalizations, surgeries, and ER visits in previous 12 months Vitals Screenings to include cognitive, depression, and falls Referrals and appointments  In addition, I have reviewed and discussed with patient certain preventive protocols, quality metrics, and best practice recommendations. A written personalized care plan for preventive services as well as general preventive health recommendations were provided to patient.     Dionisio David, LPN   [id number]   Nurse Notes: none

## 2022-04-11 ENCOUNTER — Other Ambulatory Visit: Payer: Self-pay | Admitting: *Deleted

## 2022-04-11 ENCOUNTER — Telehealth: Payer: Self-pay | Admitting: Gastroenterology

## 2022-04-11 DIAGNOSIS — Z1211 Encounter for screening for malignant neoplasm of colon: Secondary | ICD-10-CM

## 2022-04-11 DIAGNOSIS — Z86010 Personal history of colonic polyps: Secondary | ICD-10-CM

## 2022-04-11 NOTE — Telephone Encounter (Signed)
Spoken to patient and stated that she is ready to schedule the colonoscopy. We have sent the date for 05/25/2022.  New instructions will be sent to patient.

## 2022-04-11 NOTE — Telephone Encounter (Signed)
Pt left message to schedule colonoscpy would like a call back

## 2022-05-25 ENCOUNTER — Encounter
Admission: RE | Disposition: A | Discharge status: Home / Self Care | Payer: Self-pay | Source: Home / Self Care | Attending: Gastroenterology

## 2022-05-25 ENCOUNTER — Ambulatory Visit: Payer: Medicare Other | Admitting: General Practice

## 2022-05-25 ENCOUNTER — Ambulatory Visit
Admission: RE | Admit: 2022-05-25 | Discharge: 2022-05-25 | Disposition: A | Discharge status: Home / Self Care | Payer: Medicare Other | Attending: Gastroenterology | Admitting: Gastroenterology

## 2022-05-25 DIAGNOSIS — K219 Gastro-esophageal reflux disease without esophagitis: Secondary | ICD-10-CM | POA: Insufficient documentation

## 2022-05-25 DIAGNOSIS — F32A Depression, unspecified: Secondary | ICD-10-CM | POA: Diagnosis not present

## 2022-05-25 DIAGNOSIS — Z09 Encounter for follow-up examination after completed treatment for conditions other than malignant neoplasm: Secondary | ICD-10-CM | POA: Diagnosis not present

## 2022-05-25 DIAGNOSIS — F419 Anxiety disorder, unspecified: Secondary | ICD-10-CM | POA: Diagnosis not present

## 2022-05-25 DIAGNOSIS — Z860100 Personal history of colon polyps, unspecified: Secondary | ICD-10-CM

## 2022-05-25 DIAGNOSIS — D124 Benign neoplasm of descending colon: Secondary | ICD-10-CM | POA: Insufficient documentation

## 2022-05-25 DIAGNOSIS — E559 Vitamin D deficiency, unspecified: Secondary | ICD-10-CM | POA: Insufficient documentation

## 2022-05-25 DIAGNOSIS — D120 Benign neoplasm of cecum: Secondary | ICD-10-CM | POA: Diagnosis not present

## 2022-05-25 DIAGNOSIS — K5730 Diverticulosis of large intestine without perforation or abscess without bleeding: Secondary | ICD-10-CM | POA: Insufficient documentation

## 2022-05-25 DIAGNOSIS — Z1211 Encounter for screening for malignant neoplasm of colon: Secondary | ICD-10-CM | POA: Diagnosis not present

## 2022-05-25 DIAGNOSIS — G4730 Sleep apnea, unspecified: Secondary | ICD-10-CM | POA: Diagnosis not present

## 2022-05-25 DIAGNOSIS — M8580 Other specified disorders of bone density and structure, unspecified site: Secondary | ICD-10-CM | POA: Diagnosis not present

## 2022-05-25 DIAGNOSIS — K635 Polyp of colon: Secondary | ICD-10-CM | POA: Diagnosis not present

## 2022-05-25 HISTORY — PX: COLONOSCOPY WITH PROPOFOL: SHX5780

## 2022-05-25 SURGERY — COLONOSCOPY WITH PROPOFOL
Anesthesia: General

## 2022-05-25 MED ORDER — GLYCOPYRROLATE 0.2 MG/ML IJ SOLN
INTRAMUSCULAR | Status: AC
  Filled 2022-05-25: qty 1

## 2022-05-25 MED ORDER — PROPOFOL 10 MG/ML IV BOLUS
INTRAVENOUS | Status: DC | PRN
  Administered 2022-05-25: 70 mg via INTRAVENOUS
  Administered 2022-05-25: 10 mg via INTRAVENOUS

## 2022-05-25 MED ORDER — PROPOFOL 500 MG/50ML IV EMUL
INTRAVENOUS | Status: DC | PRN
  Administered 2022-05-25: 160 ug/kg/min via INTRAVENOUS

## 2022-05-25 MED ORDER — SODIUM CHLORIDE 0.9 % IV SOLN
INTRAVENOUS | Status: DC
  Administered 2022-05-25: 20 mL/h via INTRAVENOUS

## 2022-05-25 NOTE — Anesthesia Preprocedure Evaluation (Signed)
Anesthesia Evaluation  Patient identified by MRN, date of birth, ID band Patient awake    Reviewed: Allergy & Precautions, H&P , NPO status , [name] Chart, lab work & pertinent test results  Airway Mallampati: III  TM Distance: >3 FB Neck ROM: limited    Dental  (+) Poor Dentition, Dental Advidsory Given   Pulmonary sleep apnea and Continuous Positive Airway Pressure Ventilation           Cardiovascular Exercise Tolerance: Good (-) Past MI and (-) DOE negative cardio ROS      Neuro/Psych  PSYCHIATRIC DISORDERS      negative neurological ROS     GI/Hepatic Neg liver ROS,GERD  Medicated and Controlled,,  Endo/Other  negative endocrine ROS    Renal/GU negative Renal ROS  negative genitourinary   Musculoskeletal   Abdominal   Peds  Hematology negative hematology ROS (+)   Anesthesia Other Findings Past Medical History: No date: Allergy No date: Anxiety No date: Depression No date: GERD (gastroesophageal reflux disease) No date: IBS (irritable bowel syndrome) No date: IFG (impaired fasting glucose) No date: Osteopenia No date: Sleep apnea No date: Vitamin D deficiency  Past Surgical History: No date: bright's surgery No date: SKIN TAG REMOVAL     Comment:  rectal  BMI    Body Mass Index:  27.88 kg/m      Reproductive/Obstetrics negative OB ROS                             Anesthesia Physical Anesthesia Plan  ASA: 2  Anesthesia Plan: General   Post-op Pain Management:    Induction: Intravenous  PONV Risk Score and Plan: 3 and Propofol infusion  Airway Management Planned: Natural Airway and Nasal Cannula  Additional Equipment:   Intra-op Plan:   Post-operative Plan:   Informed Consent: I have reviewed the patients History and Physical, chart, labs and discussed the procedure including the risks, benefits and alternatives for the proposed anesthesia with the patient  or authorized representative who has indicated his/her understanding and acceptance.     Dental Advisory Given  Plan Discussed with: Anesthesiologist, CRNA and Surgeon  Anesthesia Plan Comments: (Patient consented for risks of anesthesia including but not limited to:  - adverse reactions to medications - risk of intubation if required - damage to teeth, lips or other oral mucosa - sore throat or hoarseness - Damage to heart, brain, lungs or loss of life  Patient voiced understanding.)        Anesthesia Quick Evaluation

## 2022-05-25 NOTE — Anesthesia Procedure Notes (Signed)
Date/Time: 05/25/2022 9:40 AM  Performed by: Malva Cogan, CRNAPre-anesthesia Checklist: Patient identified, Emergency Drugs available, Suction available, Patient being monitored and Timeout performed Patient Re-evaluated:Patient Re-evaluated prior to induction Oxygen Delivery Method: Nasal cannula Induction Type: IV induction Placement Confirmation: CO2 detector and positive ETCO2

## 2022-05-25 NOTE — Anesthesia Postprocedure Evaluation (Signed)
Anesthesia Post Note  Patient: Marie Perry  Procedure(s) Performed: COLONOSCOPY WITH PROPOFOL  Patient location during evaluation: Endoscopy Anesthesia Type: General Level of consciousness: awake and alert Pain management: pain level controlled Vital Signs Assessment: post-procedure vital signs reviewed and stable Respiratory status: spontaneous breathing, nonlabored ventilation, respiratory function stable and patient connected to nasal cannula oxygen Cardiovascular status: blood pressure returned to baseline and stable Postop Assessment: no apparent nausea or vomiting Anesthetic complications: no  No notable events documented.   Last Vitals:  Vitals:   05/25/22 1008 05/25/22 1018  BP: (!) 99/53 (!) 112/58  Pulse: 79 71  Resp: 16 20  Temp: (!) 36 C   SpO2: 97% 100%    Last Pain:  Vitals:   05/25/22 1018  TempSrc:   PainSc: 0-No pain                 Stephanie Coup

## 2022-05-25 NOTE — Transfer of Care (Signed)
Immediate Anesthesia Transfer of Care Note  Patient: Marie Perry  Procedure(s) Performed: COLONOSCOPY WITH PROPOFOL  Patient Location: PACU  Anesthesia Type:General  Level of Consciousness: drowsy  Airway & Oxygen Therapy: Patient Spontanous Breathing  Post-op Assessment: Report given to RN and Post -op Vital signs reviewed and stable  Post vital signs: Reviewed and stable  Last Vitals:  Vitals Value Taken Time  BP    Temp    Pulse    Resp    SpO2      Last Pain:  Vitals:   05/25/22 0847  TempSrc: Temporal  PainSc: 0-No pain         Complications: No notable events documented.

## 2022-05-25 NOTE — H&P (Signed)
Arlyss Repress, MD [address]  [address]  [place], [place] [address]  Main: [phone]  Fax: [phone] Pager: [phone]  Primary Care Physician:  Marjie Skiff, NP Primary Gastroenterologist:  Dr. Arlyss Repress  Pre-Procedure History & Physical: HPI:  Marie Perry is a 72 y.o. female is here for an colonoscopy.   Past Medical History:  Diagnosis Date   Allergy    Anxiety    Arthritis    Cataract 02/2016   Yearly eye exam w/Dr. 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12, had "brights disease"   Depression    GERD (gastroesophageal reflux disease)    Hypertriglyceridemia    IBS (irritable bowel syndrome)    IFG (impaired fasting glucose)    Osteopenia    Sleep apnea    Vitamin D deficiency     Past Surgical History:  Procedure Laterality Date   bright's surgery     COLONOSCOPY N/A 09/17/2016   Procedure: COLONOSCOPY;  Surgeon: Christena Deem, MD;  Location: [facility] ENDOSCOPY;  Service: Endoscopy;  Laterality: N/A;   COLONOSCOPY WITH PROPOFOL N/A 04/15/2017   Procedure: COLONOSCOPY WITH PROPOFOL;  Surgeon: Christena Deem, MD;  Location: [facility] ENDOSCOPY;  Service: Endoscopy;  Laterality: N/A;   SKIN TAG REMOVAL     rectal    Prior to Admission medications   Medication Sig Start Date End Date Taking? Authorizing Provider  busPIRone (BUSPAR) 10 MG tablet Take 1 tablet (10 mg total) by mouth 2 (two) times daily. 06/12/21  Yes Cannady, Jolene T, NP  Cholecalciferol (VITAMIN D PO) Take 2,000 Units by mouth daily.    Yes [provider]  Cyanocobalamin (B-12 PO) Take 1,000 mcg by mouth daily.    Yes [provider]  DULoxetine (CYMBALTA) 60 MG capsule Take 1 capsule (60 mg total) by mouth daily. 06/12/21  Yes Cannady, Jolene T, NP  ibandronate (BONIVA) 150 MG tablet Take 1 tablet (150 mg total) by mouth every 30 (thirty) days. Take in the morning with a full glass of water, on an empty stomach, and do not take anything else by mouth  or lie down for the next 30 min. 09/12/21  Yes Cannady, Jolene T, NP  rosuvastatin (CRESTOR) 10 MG tablet Take 1 tablet (10 mg total) by mouth daily. 06/12/21  Yes Cannady, Jolene T, NP  omeprazole (PRILOSEC) 20 MG capsule Take 1 capsule (20 mg total) by mouth daily. Patient not taking: Reported on 04/10/2022 06/12/21   Aura Dials T, NP  ondansetron (ZOFRAN) 4 MG tablet Take 1 tablet (4 mg total) by mouth every 8 (eight) hours as needed for nausea or vomiting. Patient not taking: Reported on 04/10/2022 05/26/21   Mecum, Erin E, PA-C    Allergies as of 04/11/2022 - Review Complete 04/10/2022  Allergen Reaction Noted   Bactrim [sulfamethoxazole-trimethoprim] Nausea And Vomiting 05/26/2021    Family History  Problem Relation Age of Onset   Mental illness Mother    Depression Mother    Anxiety disorder Mother    Hearing loss Mother    Miscarriages / [place] Mother    Vision loss Mother    Dementia Mother    Parkinson's disease Mother    Stroke Father    Heart disease Father    Hearing loss Father    Vision loss Father    Cancer Maternal Grandmother        ovarian   Anemia Maternal Grandmother    Arthritis Maternal Grandmother  Lung disease Maternal Grandfather    Birth defects Maternal Grandfather    Asthma Maternal Grandfather    Depression Sister    Fibromyalgia Brother    GER disease Brother    Heart disease Paternal Grandmother    Birth defects Paternal Grandfather    Depression Brother    Asthma Paternal Uncle    Birth defects Sister    Early death Sister    Cancer Maternal Uncle    Hearing loss Maternal Uncle    Kidney disease Maternal Uncle    Depression Brother    Early death Paternal Uncle    Hearing loss Maternal Aunt    Vision loss Maternal Aunt    Varicose Veins Maternal Aunt    Heart disease Paternal Uncle    Stroke Paternal Uncle    Breast cancer Neg Hx     Social History   Socioeconomic History   Marital status: Single    Spouse name: Not on  file   Number of children: 0   Years of education: Not on file   Highest education level: Associate degree: academic program  Occupational History   Occupation: retired    Comment: works part time  Tobacco Use   Smoking status: Never   Smokeless tobacco: Never  Vaping Use   Vaping Use: Never used  Substance and Sexual Activity   Alcohol use: Yes    Alcohol/week: 0.0 standard drinks of alcohol    Comment: rarely indulge   Drug use: Never   Sexual activity: Not Currently  Other Topics Concern   Not on file  Social History Narrative   Not on file   Social Determinants of Health   Financial Resource Strain: Low Risk  (04/10/2022)   Overall Financial Resource Strain (CARDIA)    Difficulty of Paying Living Expenses: Not hard at all  Food Insecurity: No Food Insecurity (04/10/2022)   Hunger Vital Sign    Worried About Running Out of Food in the Last Year: Never true    Ran Out of Food in the Last Year: Never true  Transportation Needs: No Transportation Needs (04/10/2022)   PRAPARE - Administrator, Civil Service (Medical): No    Lack of Transportation (Non-Medical): No  Physical Activity: Inactive (04/10/2022)   Exercise Vital Sign    Days of Exercise per Week: 0 days    Minutes of Exercise per Session: 0 min  Stress: Stress Concern Present (04/10/2022)   [employer] of Occupational Health - Occupational Stress Questionnaire    Feeling of Stress : To some extent  Social Connections: Moderately Isolated (04/10/2022)   Social Connection and Isolation Panel [NHANES]    Frequency of Communication with Friends and Family: More than three times a week    Frequency of Social Gatherings with Friends and Family: Once a week    Attends Religious Services: More than 4 times per year    Active Member of [employer] or Organizations: No    Attends Banker Meetings: Never    Marital Status: Divorced  Catering manager Violence: Not At Risk (04/10/2022)   Humiliation,  Afraid, Rape, and Kick questionnaire    Fear of Current or Ex-Partner: No    Emotionally Abused: No    Physically Abused: No    Sexually Abused: No    Review of Systems: See HPI, otherwise negative ROS  Physical Exam: BP 134/76   Pulse 82   Temp (!) 97.3 F (36.3 C) (Temporal)   Resp 20   Ht  5\' 6"  (1.676 m)   Wt 87.5 kg   LMP  (LMP Unknown)   SpO2 99%   BMI 31.15 kg/m  General:   Alert,  pleasant and cooperative in NAD Head:  Normocephalic and atraumatic. Neck:  Supple; no masses or thyromegaly. Lungs:  Clear throughout to auscultation.    Heart:  Regular rate and rhythm. Abdomen:  Soft, nontender and nondistended. Normal bowel sounds, without guarding, and without rebound.   Neurologic:  Alert and  oriented x4;  grossly normal neurologically.  Impression/Plan: Marie Perry is here for an colonoscopy to be performed for h/o colon polyps  Risks, benefits, limitations, and alternatives regarding  colonoscopy have been reviewed with the patient.  Questions have been answered.  All parties agreeable.   Lannette Donath, MD  05/25/2022, 8:57 AM

## 2022-05-25 NOTE — Op Note (Signed)
[facility] Gastroenterology Patient Name: Marie Perry Procedure Date: 05/25/2022 9:33 AM MRN: [id number] Account #: [id number] Date of Birth: [birth year] Admit Type: Outpatient Age: 72 Room: [facility] ENDO ROOM 1 Gender: Female Note Status: Finalized Instrument Name: Prentice Docker [id number] Procedure:             Colonoscopy Indications:           Surveillance: Personal history of adenomatous polyps                         on last colonoscopy 5 years ago, Last colonoscopy:                         March 2019 Providers:             Toney Reil MD, MD Referring MD:          Dorie Rank. Harvest Dark (Referring MD) Medicines:             General Anesthesia Complications:         No immediate complications. Estimated blood loss: None. Procedure:             Pre-Anesthesia Assessment:                        - Prior to the procedure, a History and Physical was                         performed, and patient medications and allergies were                         reviewed. The patient is competent. The risks and                         benefits of the procedure and the sedation options and                         risks were discussed with the patient. All questions                         were answered and informed consent was obtained.                         Patient identification and proposed procedure were                         verified by the physician, the nurse, the                         anesthesiologist, the anesthetist and the technician                         in the pre-procedure area in the procedure room in the                         endoscopy suite. Mental Status Examination: alert and                         oriented. Airway Examination: normal oropharyngeal  airway and neck mobility. Respiratory Examination:                         clear to auscultation. CV Examination: normal.                         Prophylactic Antibiotics: The  patient does not require                         prophylactic antibiotics. Prior Anticoagulants: The                         patient has taken no anticoagulant or antiplatelet                         agents. ASA Grade Assessment: II - A patient with mild                         systemic disease. After reviewing the risks and                         benefits, the patient was deemed in satisfactory                         condition to undergo the procedure. The anesthesia                         plan was to use general anesthesia. Immediately prior                         to administration of medications, the patient was                         re-assessed for adequacy to receive sedatives. The                         heart rate, respiratory rate, oxygen saturations,                         blood pressure, adequacy of pulmonary ventilation, and                         response to care were monitored throughout the                         procedure. The physical status of the patient was                         re-assessed after the procedure.                        After obtaining informed consent, the colonoscope was                         passed under direct vision. Throughout the procedure,                         the patient's blood pressure, pulse, and oxygen  saturations were monitored continuously. The                         Colonoscope was introduced through the anus and                         advanced to the the cecum, identified by appendiceal                         orifice and ileocecal valve. The colonoscopy was                         performed without difficulty. The patient tolerated                         the procedure well. The quality of the bowel                         preparation was evaluated using the BBPS [facility] Bowel                         Preparation Scale) with scores of: Right Colon = 3,                         Transverse Colon = 3 and  Left Colon = 3 (entire mucosa                         seen well with no residual staining, small fragments                         of stool or opaque liquid). The total BBPS score                         equals 9. The ileocecal valve, appendiceal orifice,                         and rectum were photographed. Findings:      The perianal and digital rectal examinations were normal. Pertinent       negatives include normal sphincter tone and no palpable rectal lesions.      A 5 mm polyp was found in the cecum. The polyp was sessile. The polyp       was removed with a cold snare. Resection and retrieval were complete.      Three sessile polyps were found in the descending colon. The polyps were       3 to 6 mm in size. These polyps were removed with a cold snare.       Resection and retrieval were complete. Estimated blood loss: none.      Multiple large-mouthed diverticula were found in the left colon.      The retroflexed view of the distal rectum and anal verge was normal and       showed no anal or rectal abnormalities. Impression:            - One 5 mm polyp in the cecum, removed with a cold                         snare. Resected  and retrieved.                        - Three 3 to 6 mm polyps in the descending colon,                         removed with a cold snare. Resected and retrieved.                        - Diverticulosis in the left colon.                        - The distal rectum and anal verge are normal on                         retroflexion view. Recommendation:        - Discharge patient to home (with escort).                        - Resume previous diet today.                        - Continue present medications.                        - Await pathology results.                        - Repeat colonoscopy in 5 years for surveillance. Procedure Code(s):     --- Professional ---                        [phone], Colonoscopy, flexible; with removal of                          tumor(s), polyp(s), or other lesion(s) by snare                         technique Diagnosis Code(s):     --- Professional ---                        Z86.010, Personal history of colonic polyps                        D12.0, Benign neoplasm of cecum                        D12.4, Benign neoplasm of descending colon                        K57.30, Diverticulosis of large intestine without                         perforation or abscess without bleeding CPT copyright 2022 American Medical Association. All rights reserved. The codes documented in this report are preliminary and upon coder review may  be revised to meet current compliance requirements. Dr. Libby Maw Toney Reil MD, MD 05/25/2022 10:07:54 AM This report has been signed electronically. Number of Addenda: 0 Note Initiated On: 05/25/2022 9:33 AM Scope Withdrawal Time: 0 hours 16 minutes 25 seconds  Total  Procedure Duration: 0 hours 20 minutes 35 seconds  Estimated Blood Loss:  Estimated blood loss: none.      [facility]

## 2022-05-28 ENCOUNTER — Encounter: Payer: Self-pay | Admitting: Gastroenterology

## 2022-05-28 LAB — SURGICAL PATHOLOGY

## 2022-05-29 ENCOUNTER — Encounter: Payer: Self-pay | Admitting: Gastroenterology

## 2022-05-29 DIAGNOSIS — H01026 Squamous blepharitis left eye, unspecified eyelid: Secondary | ICD-10-CM | POA: Diagnosis not present

## 2022-05-29 DIAGNOSIS — H01021 Squamous blepharitis right upper eyelid: Secondary | ICD-10-CM | POA: Diagnosis not present

## 2022-05-29 DIAGNOSIS — H01023 Squamous blepharitis right eye, unspecified eyelid: Secondary | ICD-10-CM | POA: Diagnosis not present

## 2022-05-29 DIAGNOSIS — Z961 Presence of intraocular lens: Secondary | ICD-10-CM | POA: Diagnosis not present

## 2022-05-29 DIAGNOSIS — H0288A Meibomian gland dysfunction right eye, upper and lower eyelids: Secondary | ICD-10-CM | POA: Diagnosis not present

## 2022-05-29 DIAGNOSIS — H01024 Squamous blepharitis left upper eyelid: Secondary | ICD-10-CM | POA: Diagnosis not present

## 2022-05-29 DIAGNOSIS — H04123 Dry eye syndrome of bilateral lacrimal glands: Secondary | ICD-10-CM | POA: Diagnosis not present

## 2022-05-29 DIAGNOSIS — H0288B Meibomian gland dysfunction left eye, upper and lower eyelids: Secondary | ICD-10-CM | POA: Diagnosis not present

## 2022-05-29 DIAGNOSIS — H01029 Squamous blepharitis unspecified eye, unspecified eyelid: Secondary | ICD-10-CM | POA: Diagnosis not present

## 2022-06-21 ENCOUNTER — Ambulatory Visit (INDEPENDENT_AMBULATORY_CARE_PROVIDER_SITE_OTHER): Payer: Medicare Other | Admitting: Nurse Practitioner

## 2022-06-21 ENCOUNTER — Encounter: Payer: Self-pay | Admitting: Nurse Practitioner

## 2022-06-21 VITALS — BP 125/81 | HR 84 | Temp 97.7°F | Ht 66.5 in | Wt 194.1 lb

## 2022-06-21 DIAGNOSIS — F332 Major depressive disorder, recurrent severe without psychotic features: Secondary | ICD-10-CM | POA: Diagnosis not present

## 2022-06-21 DIAGNOSIS — E538 Deficiency of other specified B group vitamins: Secondary | ICD-10-CM | POA: Diagnosis not present

## 2022-06-21 DIAGNOSIS — E6609 Other obesity due to excess calories: Secondary | ICD-10-CM | POA: Diagnosis not present

## 2022-06-21 DIAGNOSIS — E782 Mixed hyperlipidemia: Secondary | ICD-10-CM | POA: Diagnosis not present

## 2022-06-21 DIAGNOSIS — F4320 Adjustment disorder, unspecified: Secondary | ICD-10-CM | POA: Insufficient documentation

## 2022-06-21 DIAGNOSIS — Z6831 Body mass index (BMI) 31.0-31.9, adult: Secondary | ICD-10-CM

## 2022-06-21 DIAGNOSIS — G4733 Obstructive sleep apnea (adult) (pediatric): Secondary | ICD-10-CM

## 2022-06-21 DIAGNOSIS — E559 Vitamin D deficiency, unspecified: Secondary | ICD-10-CM

## 2022-06-21 DIAGNOSIS — K219 Gastro-esophageal reflux disease without esophagitis: Secondary | ICD-10-CM

## 2022-06-21 DIAGNOSIS — F4321 Adjustment disorder with depressed mood: Secondary | ICD-10-CM | POA: Diagnosis not present

## 2022-06-21 DIAGNOSIS — Z0000 Encounter for general adult medical examination without abnormal findings: Secondary | ICD-10-CM

## 2022-06-21 DIAGNOSIS — M810 Age-related osteoporosis without current pathological fracture: Secondary | ICD-10-CM

## 2022-06-21 MED ORDER — BUPROPION HCL ER (XL) 150 MG PO TB24
150.0000 mg | ORAL_TABLET | Freq: Every day | ORAL | 4 refills | Status: DC
Start: 2022-06-21 — End: 2022-07-13

## 2022-06-21 MED ORDER — BUSPIRONE HCL 10 MG PO TABS: 10.0000 mg | ORAL_TABLET | Freq: Two times a day (BID) | ORAL | 4 refills | Status: DC

## 2022-06-21 MED ORDER — IBANDRONATE SODIUM 150 MG PO TABS: 150.0000 mg | ORAL_TABLET | ORAL | 4 refills | Status: DC

## 2022-06-21 MED ORDER — ROSUVASTATIN CALCIUM 10 MG PO TABS: 10.0000 mg | ORAL_TABLET | Freq: Every day | ORAL | 4 refills | Status: DC

## 2022-06-21 MED ORDER — DULOXETINE HCL 60 MG PO CPEP: 60.0000 mg | ORAL_CAPSULE | Freq: Every day | ORAL | 4 refills | Status: DC

## 2022-06-21 NOTE — Patient Instructions (Signed)
Managing Loss, Adult People experience loss in many different ways throughout their lives. Events such as moving, changing jobs, and losing friends can create a sense of loss. The loss may be as serious as a major health change, divorce, death of a pet, or death of a loved one. All of these types of loss are likely to create a physical and emotional reaction known as grief. Grief is the result of a major change or an absence of something or someone that you count on. Grief is a normal reaction to loss. A variety of factors can affect your grieving experience, including: The nature of your loss. Your relationship to what or whom you lost. Your understanding of grief and how to manage it. Your support system. Be aware that when grief becomes extreme, it can lead to more severe issues like isolation, depression, anxiety, or suicidal thoughts. Talk with your health care provider if you have any of these issues. How to manage lifestyle changes Keep to your normal routine as much as possible. If you have trouble focusing or doing normal activities, it is acceptable to take some time away from your normal routine. Spend time with friends and loved ones. Eat a healthy diet, get plenty of sleep, and rest when you feel tired. How to recognize changes  The way that you deal with your grief will affect your ability to function as you normally do. When grieving, you may experience these changes: Numbness, shock, sadness, anxiety, anger, denial, and guilt. Thoughts about death. Unexpected crying. A physical sensation of emptiness in your stomach. Problems sleeping and eating. Tiredness (fatigue). Loss of interest in normal activities. Dreaming about or imagining seeing the person who died. A need to remember what or whom you lost. Difficulty thinking about anything other than your loss for a period of time. Relief. If you have been expecting the loss for a while, you may feel a sense of relief when it  happens. Follow these instructions at home: Activity Express your feelings in healthy ways, such as: Talking with others about your loss. It may be helpful to find others who have had a similar loss, such as a support group. Writing down your feelings in a journal. Doing physical activities to release stress and emotional energy. Doing creative activities like painting, sculpting, or playing or listening to music. Practicing resilience. This is the ability to recover and adjust after facing challenges. Reading some resources that encourage resilience may help you to learn ways to practice those behaviors.  General instructions Be patient with yourself and others. Allow the grieving process to happen, and remember that grieving takes time. It is likely that you may never feel completely done with some grief. You may find a way to move on while still cherishing memories and feelings about your loss. Accepting your loss is a process. It can take months or longer to adjust. Keep all follow-up visits. This is important. Where to find support To get support for managing loss: Ask your health care provider for help and recommendations, such as grief counseling or therapy. Think about joining a support group for people who are managing a loss. Where to find more information You can find more information about managing loss from: American Society of Clinical Oncology: www.cancer.net American Psychological Association: www.apa.org Contact a health care provider if: Your grief is extreme and keeps getting worse. You have ongoing grief that does not improve. Your body shows symptoms of grief, such as illness. You feel depressed, anxious, or   hopeless. Get help right away if: You have thoughts about hurting yourself or others. Get help right away if you feel like you may hurt yourself or others, or have thoughts about taking your own life. Go to your nearest emergency room or: Call 911. Call the  National Suicide Prevention Lifeline at 1-800-273-8255 or 988. This is open 24 hours a day. Text the Crisis Text Line at 741741. Summary Grief is the result of a major change or an absence of someone or something that you count on. Grief is a normal reaction to loss. The depth of grief and the period of recovery depend on the type of loss and your ability to adjust to the change and process your feelings. Processing grief requires patience and a willingness to accept your feelings and talk about your loss with people who are supportive. It is important to find resources that work for you and to realize that people experience grief differently. There is not one grieving process that works for everyone in the same way. Be aware that when grief becomes extreme, it can lead to more severe issues like isolation, depression, anxiety, or suicidal thoughts. Talk with your health care provider if you have any of these issues. This information is not intended to replace advice given to you by your health care provider. Make sure you discuss any questions you have with your health care provider. Document Revised: 09/19/2020 Document Reviewed: 09/19/2020 Elsevier Patient Education  2023 Elsevier Inc.  

## 2022-06-21 NOTE — Progress Notes (Signed)
BP 125/81   Pulse 84   Temp 97.7 F (36.5 C) (Oral)   Ht 5' 6.5" (1.689 m)   Wt 194 lb 1.6 oz (88 kg)   LMP  (LMP Unknown)   SpO2 98%   BMI 30.86 kg/m    Subjective:    Patient ID: Marie Perry, female    DOB: 08-08-1950, 72 y.o.   MRN: [id number]  HPI: Marie Perry is a 72 y.o. female presenting on 06/21/2022 for comprehensive medical examination. Current medical complaints include:none  She currently lives with: self Menopausal Symptoms: no   OSTEOPOROSIS In 2019 DEXA noted this with T -2.9 and 01/04/21 with T-score of -3.0.  Tried oral medications, but caused GI issues and GERD.  Could not obtain Prolia -- currently taking Boniva.  Using supplements -- Vitamin D.  Has B12 deficiency -- taking supplement. Satisfied with current treatment?: yes Past osteoporosis medications/treatments: Fosamax Adequate calcium & vitamin D: yes Intolerance to bisphosphonates:yes -- Fosamax Weight bearing exercises: yes  HYPERLIPIDEMIA Taking Rosuvastatin 10 MG three days a week. Has CPAP and uses 100% of the time.   Hyperlipidemia status: good compliance Satisfied with current treatment?  yes Side effects:  no Medication compliance: good compliance Past cholesterol meds:  Supplements: none Aspirin:  no The 10-year ASCVD risk score (Arnett DK, et al., 2019) is: 10%   Values used to calculate the score:     Age: 32 years     Sex: Female     Is Non-Hispanic African American: No     Diabetic: No     Tobacco smoker: No     Systolic Blood Pressure: 125 mmHg     Is BP treated: No     HDL Cholesterol: 49 mg/dL     Total Cholesterol: 178 mg/dL Chest pain:  no Coronary artery disease:  no Family history CAD:  yes -- father with MIs Family history early CAD:  no   GERD Taking Prilosec 20 MG daily.   GERD control status: stable Satisfied with current treatment? no Heartburn frequency: few times a week Medication side effects: no  Medication compliance:  fluctuating Previous GERD medications: TUMS and Prilosec Antacid use frequency:  occasional Dysphagia: no Odynophagia:  no Hematemesis: no Blood in stool: no EGD: no  DEPRESSION Was primary caregiver to her mother for 30 years and she just passed -- was not a good experience due to difficulty getting hospice in (Lewy body dementia).  Current medications include Cymbalta 60 MG and Buspar 10 MG BID.  Currently has friends and family who are supporting her and checking in.  Struggling with grieving process. She did self increase her Buspar 20 MG in morning and 1 at night, has not returned to 10 MG BID. Mood status: stable Satisfied with current treatment?: yes Symptom severity: moderate  Duration of current treatment : chronic Side effects: no Medication compliance: good compliance Psychotherapy/counseling: in past Previous psychiatric medications: Cymbalta and Buspar Depressed mood: yes Anxious mood: yes Anhedonia: yes Significant weight loss or gain: no Insomnia: yes, difficulty falling asleep Fatigue: no Feelings of worthlessness or guilt: no Impaired concentration/indecisiveness: yes Suicidal ideations: no Hopelessness: no Crying spells: yes    06/21/2022   10:44 AM 04/10/2022   11:36 AM 09/12/2021   10:38 AM 06/12/2021    9:29 AM 05/23/2021   10:43 AM  Depression screen PHQ 2/9  Decreased Interest 3  1 1 2   Down, Depressed, Hopeless 3 1 1 1 2   PHQ - 2 Score 6  1 2 2 4   Altered sleeping 3 1 1 1 2   Tired, decreased energy 2 1 2 1 2   Change in appetite 2  1 1 2   Feeling bad or failure about yourself  1  1 1 2   Trouble concentrating 1  1 1 1   Moving slowly or fidgety/restless 1  1 0 0  Suicidal thoughts 1  0 0 0  PHQ-9 Score 17 3 9 7 13   Difficult doing work/chores Very difficult    Not difficult at all      06/21/2022   10:44 AM 09/12/2021   10:39 AM 06/12/2021    9:30 AM 05/23/2021   10:43 AM  GAD 7 : Generalized Anxiety Score  Nervous, Anxious, on Edge 2 1 1 1    Control/stop worrying 3 1 0 1  Worry too much - different things 2 2 1 2   Trouble relaxing 2 1 0 1  Restless 1 0 0 0  Easily annoyed or irritable 0 1 0 0  Afraid - awful might happen 1 1 0 0  Total GAD 7 Score 11 7 2 5   Anxiety Difficulty Somewhat difficult  Somewhat difficult Not difficult at all        06/12/2021    9:29 AM 06/12/2021    9:50 AM 04/04/2022   11:27 AM 04/10/2022   11:38 AM 06/21/2022   10:44 AM  Fall Risk  Falls in the past year? 0 0 1 1 0  Was there an injury with Fall? 1 0 1 0 0  Fall Risk Category Calculator 1 0 2 1 0  Fall Risk Category (Retired) Low Low     (RETIRED) Patient Fall Risk Level Low fall risk Low fall risk     Patient at Risk for Falls Due to History of fall(s) No Fall Risks  History of fall(s) No Fall Risks  Fall risk Follow up Falls evaluation completed Falls evaluation completed  Falls evaluation completed;Falls prevention discussed Falls evaluation completed    Functional Status Survey: Is the patient deaf or have difficulty hearing?: No Does the patient have difficulty seeing, even when wearing glasses/contacts?: No Does the patient have difficulty concentrating, remembering, or making decisions?: No Does the patient have difficulty walking or climbing stairs?: No Does the patient have difficulty dressing or bathing?: No Does the patient have difficulty doing errands alone such as visiting a doctor's office or shopping?: No    Past Medical History:  Past Medical History:  Diagnosis Date   Allergy    Anxiety    Arthritis    Cataract 02/2016   Yearly eye exam w/Dr. 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12, had "brights disease"   Depression    GERD (gastroesophageal reflux disease)    Hypertriglyceridemia    IBS (irritable bowel syndrome)    IFG (impaired fasting glucose)    Osteopenia    Sleep apnea    Vitamin D deficiency     Surgical History:  Past Surgical History:  Procedure Laterality Date   bright's surgery      COLONOSCOPY N/A 09/17/2016   Procedure: COLONOSCOPY;  Surgeon: Christena Deem, MD;  Location: [facility] ENDOSCOPY;  Service: Endoscopy;  Laterality: N/A;   COLONOSCOPY WITH PROPOFOL N/A 04/15/2017   Procedure: COLONOSCOPY WITH PROPOFOL;  Surgeon: Christena Deem, MD;  Location: [facility] ENDOSCOPY;  Service: Endoscopy;  Laterality: N/A;   COLONOSCOPY WITH PROPOFOL N/A 05/25/2022   Procedure: COLONOSCOPY WITH PROPOFOL;  Surgeon: Toney Reil, MD;  Location: [facility] ENDOSCOPY;  Service: Gastroenterology;  Laterality: N/A;   SKIN TAG REMOVAL     rectal    Medications:  Current Outpatient Medications on File Prior to Visit  Medication Sig   Cholecalciferol (VITAMIN D PO) Take 2,000 Units by mouth daily.    Cyanocobalamin (B-12 PO) Take 1,000 mcg by mouth daily.    No current facility-administered medications on file prior to visit.    Allergies:  Allergies  Allergen Reactions   Bactrim [Sulfamethoxazole-Trimethoprim] Nausea And Vomiting    Social History:  Social History   Socioeconomic History   Marital status: Single    Spouse name: Not on file   Number of children: 0   Years of education: Not on file   Highest education level: Associate degree: academic program  Occupational History   Occupation: retired    Comment: works part time  Tobacco Use   Smoking status: Never   Smokeless tobacco: Never  Vaping Use   Vaping Use: Never used  Substance and Sexual Activity   Alcohol use: Yes    Alcohol/week: 0.0 standard drinks of alcohol    Comment: rarely indulge   Drug use: Never   Sexual activity: Not Currently  Other Topics Concern   Not on file  Social History Narrative   Not on file   Social Determinants of Health   Financial Resource Strain: Low Risk  (04/10/2022)   Overall Financial Resource Strain (CARDIA)    Difficulty of Paying Living Expenses: Not hard at all  Food Insecurity: No Food Insecurity (04/10/2022)   Hunger Vital Sign    Worried About Running Out of  Food in the Last Year: Never true    Ran Out of Food in the Last Year: Never true  Transportation Needs: No Transportation Needs (04/10/2022)   PRAPARE - Administrator, Civil Service (Medical): No    Lack of Transportation (Non-Medical): No  Physical Activity: Inactive (04/10/2022)   Exercise Vital Sign    Days of Exercise per Week: 0 days    Minutes of Exercise per Session: 0 min  Stress: Stress Concern Present (04/10/2022)   [employer] of Occupational Health - Occupational Stress Questionnaire    Feeling of Stress : To some extent  Social Connections: Moderately Isolated (04/10/2022)   Social Connection and Isolation Panel [NHANES]    Frequency of Communication with Friends and Family: More than three times a week    Frequency of Social Gatherings with Friends and Family: Once a week    Attends Religious Services: More than 4 times per year    Active Member of [employer] or Organizations: No    Attends Banker Meetings: Never    Marital Status: Divorced  Catering manager Violence: Not At Risk (04/10/2022)   Humiliation, Afraid, Rape, and Kick questionnaire    Fear of Current or Ex-Partner: No    Emotionally Abused: No    Physically Abused: No    Sexually Abused: No   Social History   Tobacco Use  Smoking Status Never  Smokeless Tobacco Never   Social History   Substance and Sexual Activity  Alcohol Use Yes   Alcohol/week: 0.0 standard drinks of alcohol   Comment: rarely indulge    Family History:  Family History  Problem Relation Age of Onset   Mental illness Mother    Depression Mother    Anxiety disorder Mother    Hearing loss Mother    Miscarriages / [place] Mother    Vision loss  Mother    Dementia Mother    Parkinson's disease Mother    Stroke Father    Heart disease Father    Hearing loss Father    Vision loss Father    Cancer Maternal Grandmother        ovarian   Anemia Maternal Grandmother    Arthritis Maternal Grandmother     Lung disease Maternal Grandfather    Birth defects Maternal Grandfather    Asthma Maternal Grandfather    Depression Sister    Fibromyalgia Brother    GER disease Brother    Heart disease Paternal Grandmother    Birth defects Paternal Grandfather    Depression Brother    Asthma Paternal Uncle    Birth defects Sister    Early death Sister    Cancer Maternal Uncle    Hearing loss Maternal Uncle    Kidney disease Maternal Uncle    Depression Brother    Early death Paternal Uncle    Hearing loss Maternal Aunt    Vision loss Maternal Aunt    Varicose Veins Maternal Aunt    Heart disease Paternal Uncle    Stroke Paternal Uncle    Breast cancer Neg Hx     Past medical history, surgical history, medications, allergies, family history and social history reviewed with patient today and changes made to appropriate areas of the chart.   Review of Systems - negative All other ROS negative except what is listed above and in the HPI.      Objective:    BP 125/81   Pulse 84   Temp 97.7 F (36.5 C) (Oral)   Ht 5' 6.5" (1.689 m)   Wt 194 lb 1.6 oz (88 kg)   LMP  (LMP Unknown)   SpO2 98%   BMI 30.86 kg/m   Wt Readings from Last 3 Encounters:  06/21/22 194 lb 1.6 oz (88 kg)  05/25/22 193 lb (87.5 kg)  04/10/22 195 lb (88.5 kg)    Physical Exam Vitals and nursing note reviewed.  Constitutional:      General: She is awake. She is not in acute distress.    Appearance: She is well-developed. She is not ill-appearing.  HENT:     Head: Normocephalic and atraumatic.     Right Ear: Hearing, tympanic membrane, ear canal and external ear normal. No drainage.     Left Ear: Hearing, tympanic membrane, ear canal and external ear normal. No drainage.     Nose: Nose normal.     Right Sinus: No maxillary sinus tenderness or frontal sinus tenderness.     Left Sinus: No maxillary sinus tenderness or frontal sinus tenderness.     Mouth/Throat:     Mouth: Mucous membranes are moist.      Pharynx: Oropharynx is clear. Uvula midline. No pharyngeal swelling, oropharyngeal exudate or posterior oropharyngeal erythema.  Eyes:     General: Lids are normal.        Right eye: No discharge.        Left eye: No discharge.     Extraocular Movements: Extraocular movements intact.     Conjunctiva/sclera: Conjunctivae normal.     Pupils: Pupils are equal, round, and reactive to light.     Visual Fields: Right eye visual fields normal and left eye visual fields normal.  Neck:     Thyroid: No thyromegaly.     Vascular: No carotid bruit.     Trachea: Trachea normal.  Cardiovascular:     Rate and Rhythm:  Normal rate and regular rhythm.     Heart sounds: Normal heart sounds. No murmur heard.    No gallop.  Pulmonary:     Effort: Pulmonary effort is normal. No accessory muscle usage or respiratory distress.     Breath sounds: Normal breath sounds.  Chest:     Comments: Deferred per patient request - recent mammogram. Abdominal:     General: Bowel sounds are normal.     Palpations: Abdomen is soft. There is no hepatomegaly or splenomegaly.     Tenderness: There is no abdominal tenderness.  Musculoskeletal:        General: Normal range of motion.     Cervical back: Normal range of motion and neck supple.     Right lower leg: No edema.     Left lower leg: No edema.  Lymphadenopathy:     Head:     Right side of head: No submental, submandibular, tonsillar, preauricular or posterior auricular adenopathy.     Left side of head: No submental, submandibular, tonsillar, preauricular or posterior auricular adenopathy.     Cervical: No cervical adenopathy.  Skin:    General: Skin is warm and dry.     Capillary Refill: Capillary refill takes less than 2 seconds.     Findings: No rash.  Neurological:     Mental Status: She is alert and oriented to person, place, and time.     Gait: Gait is intact.     Deep Tendon Reflexes: Reflexes are normal and symmetric.     Reflex Scores:       Brachioradialis reflexes are 2+ on the right side and 2+ on the left side.      Patellar reflexes are 2+ on the right side and 2+ on the left side. Psychiatric:        Attention and Perception: Attention normal.        Mood and Affect: Mood normal. Affect is tearful.        Speech: Speech normal.        Behavior: Behavior normal. Behavior is cooperative.        Thought Content: Thought content normal.        Judgment: Judgment normal.    Results for orders placed or performed during the hospital encounter of 05/25/22  Surgical pathology  Result Value Ref Range   SURGICAL PATHOLOGY      SURGICAL PATHOLOGY CASE: [id number] PATIENT: Luster Landsberg Surgical Pathology Report     Specimen Submitted: A. Colon polyp, cecum; cold snare B. Colon polyp x3, des; cold snare  Clinical History: Personal hx of colon polyps.    DIAGNOSIS: A. COLON POLYP, CECUM; COLD SNARE: - TUBULAR ADENOMA. - NEGATIVE FOR HIGH-GRADE DYSPLASIA AND MALIGNANCY.  B. COLON POLYPS X 3, DESCENDING; COLD SNARE: - FRAGMENTS (X 2) OF TUBULAR ADENOMAS. - NEGATIVE FOR HIGH-GRADE DYSPLASIA AND MALIGNANCY.  GROSS DESCRIPTION: A. Labeled: Cold snare cecal polyp Received: Formalin Collection time: 9:52 AM on 05/25/2022 Placed into formalin time: 9:52 AM on 05/25/2022 Tissue fragment(s): Multiple Size: Aggregate, 0.4 x 0.3 x 0.1 cm Description: Received is a single fragment of tan soft tissue admixed with intestinal debris.  The ratio of soft tissue to intestinal debris is 95: 5. Entirely submitted in 1 cassette.  B. Labeled: Cold snare descending colon polyp x 3 Received: For malin Collection time: 10:02 AM on 05/25/2022 Placed into formalin time: 10:02 AM on 05/25/2022 Tissue fragment(s): Multiple Size: Aggregate, 1.9 x 0.7 x 0.2 cm Description: Received are fragments of  tan soft tissue admixed with intestinal debris.  The ratio of soft tissue to intestinal debris is 90: 10. Entirely submitted in 1  cassette.  RB 05/25/2022  Final Diagnosis performed by Katherine Mantle, MD.   Electronically signed 05/28/2022 11:42:58AM The electronic signature indicates that the named Attending Pathologist has evaluated the specimen Technical component performed at [facility], [address], [place], [place] [address] Lab: [phone] Dir:  Schimke, MD, MMM  Professional component performed at [facility], [facility], [address] [place], [place], [place] [address] Lab: [phone] Dir: Beryle Quant, MD       Assessment & Plan:   Problem List Items Addressed This Visit       Respiratory   Sleep apnea    Chronic, stable.  Wears CPAP 100% of the time, continue this regimen.        Digestive   GERD (gastroesophageal reflux disease)    Chronic, ongoing.  Continue Prilosec every other day, is tolerating -- tried to lower further but had symptoms with this.  Risks of PPI use were discussed with patient including bone loss, C. Diff diarrhea, pneumonia, infections, CKD, electrolyte abnormalities.  Verbalizes understanding and chooses to continue the medication.  Mag level annually.         Musculoskeletal and Integument   Osteoporosis    Chronic, ongoing with poor tolerance to Fosamax in past.  She would like to try Prolia, but was too costly >$1000.  Will continue Boniva as is tolerating without ADR. Consider referral to endocrinology to discuss infusions if issues with Boniva.  Repeat DEXA 01/05/23.      Relevant Medications   ibandronate (BONIVA) 150 MG tablet   Other Relevant Orders   VITAMIN D 25 Hydroxy (Vit-D Deficiency, Fractures)     Other   Grief reaction    Refer to depression plan of care.      Relevant Medications   buPROPion (WELLBUTRIN XL) 150 MG 24 hr tablet   Other Relevant Orders   Ambulatory referral to Psychology   Mixed hyperlipidemia    Ongoing with some muscle aches with daily statin.  Discussed at length risks and benefits of continuing statin.   Tolerating Crestor 10 MG 3 days a week dosing without issue.  Labs: CMP and lipid panel today.      Relevant Medications   rosuvastatin (CRESTOR) 10 MG tablet   Other Relevant Orders   Comprehensive metabolic panel   Lipid Panel w/o Chol/HDL Ratio   Obesity    BMI 30.86.  Recommended eating smaller high protein, low fat meals more frequently and exercising 30 mins a day 5 times a week with a goal of 10-15lb weight loss in the next 3 months. Patient voiced their understanding and motivation to adhere to these recommendations.       Recurrent major depression-severe (HCC) - Primary    Chronic, exacerbated by recent loss of mother whom she lived with for 30 years.  Denies SI/HI.  Will continue current medication regimen at this time and adjust as needed, but will add on Wellbutrin XL starting at 150 MG daily.  This may offer benefit to anhedonia and depression during grieving period.  Educated her on this medication and use + side effects.  She wishes to trial this.  Return in 6 weeks for follow-up,      Relevant Medications   DULoxetine (CYMBALTA) 60 MG capsule   busPIRone (BUSPAR) 10 MG tablet   buPROPion (WELLBUTRIN XL) 150 MG 24 hr tablet   Other  Relevant Orders   CBC with Differential/Platelet   TSH   Ambulatory referral to Psychology   Vitamin B12 deficiency    Chronic, stable.  Continue daily supplement and recheck B12 level today, adjust as needed.      Relevant Orders   CBC with Differential/Platelet   Vitamin B12   Vitamin D deficiency    Ongoing with underlying osteoporosis and poor tolerance Fosamax in past.  Continues on daily supplement.  Vit D level today.      Relevant Orders   VITAMIN D 25 Hydroxy (Vit-D Deficiency, Fractures)   Other Visit Diagnoses     Encounter for annual physical exam       Annual physical today with labs and health maintenance reviewed, discussed with patient.        Follow up plan: Return in about 6 weeks (around 08/02/2022) for  DEPRESSION (GRIEF) -- start Wellbutrin.   LABORATORY TESTING:  - Pap smear: not applicable  IMMUNIZATIONS:   - Tdap: Tetanus vaccination status reviewed: last tetanus booster within 10 years. - Influenza: Up to date - Pneumovax: Up to date - Prevnar: Up to date - HPV: Not applicable - Zostavax vaccine: Has had x 1 - Covid: Up To Date  SCREENING: -Mammogram: Up To Date 02/01/22 - Colonoscopy: Up To Date 05/25/22 - Bone Density: Up to date due next 01/05/23 -Hearing Test: Not applicable  -Spirometry: Not applicable   PATIENT COUNSELING:   Advised to take 1 mg of folate supplement per day if capable of pregnancy.   Sexuality: Discussed sexually transmitted diseases, partner selection, use of condoms, avoidance of unintended pregnancy  and contraceptive alternatives.   Advised to avoid cigarette smoking.  I discussed with the patient that most people either abstain from alcohol or drink within safe limits (<=14/week and <=4 drinks/occasion for males, <=7/weeks and <= 3 drinks/occasion for females) and that the risk for alcohol disorders and other health effects rises proportionally with the number of drinks per week and how often a drinker exceeds daily limits.  Discussed cessation/primary prevention of drug use and availability of treatment for abuse.   Diet: Encouraged to adjust caloric intake to maintain  or achieve ideal body weight, to reduce intake of dietary saturated fat and total fat, to limit sodium intake by avoiding high sodium foods and not adding table salt, and to maintain adequate dietary potassium and calcium preferably from fresh fruits, vegetables, and low-fat dairy products.    Stressed the importance of regular exercise  Injury prevention: Discussed safety belts, safety helmets, smoke detector, smoking near bedding or upholstery.   Dental health: Discussed importance of regular tooth brushing, flossing, and dental visits.    NEXT PREVENTATIVE PHYSICAL DUE IN  1 YEAR. Return in about 6 weeks (around 08/02/2022) for DEPRESSION (GRIEF) -- start Wellbutrin.

## 2022-06-21 NOTE — Assessment & Plan Note (Signed)
Chronic, exacerbated by recent loss of mother whom she lived with for 30 years.  Denies SI/HI.  Will continue current medication regimen at this time and adjust as needed, but will add on Wellbutrin XL starting at 150 MG daily.  This may offer benefit to anhedonia and depression during grieving period.  Educated her on this medication and use + side effects.  She wishes to trial this.  Return in 6 weeks for follow-up,

## 2022-06-21 NOTE — Assessment & Plan Note (Addendum)
Chronic, ongoing with poor tolerance to Fosamax in past.  She would like to try Prolia, but was too costly >$1000.  Will continue Boniva as is tolerating without ADR. Consider referral to endocrinology to discuss infusions if issues with Boniva.  Repeat DEXA 01/05/23.

## 2022-06-21 NOTE — Assessment & Plan Note (Signed)
Ongoing with underlying osteoporosis and poor tolerance Fosamax in past.  Continues on daily supplement.  Vit D level today. 

## 2022-06-21 NOTE — Assessment & Plan Note (Signed)
Refer to depression plan of care. 

## 2022-06-21 NOTE — Assessment & Plan Note (Signed)
Chronic, stable.  Wears CPAP 100% of the time, continue this regimen. 

## 2022-06-21 NOTE — Assessment & Plan Note (Signed)
BMI 30.86.  Recommended eating smaller high protein, low fat meals more frequently and exercising 30 mins a day 5 times a week with a goal of 10-15lb weight loss in the next 3 months. Patient voiced their understanding and motivation to adhere to these recommendations.  

## 2022-06-21 NOTE — Assessment & Plan Note (Signed)
Ongoing with some muscle aches with daily statin.  Discussed at length risks and benefits of continuing statin.  Tolerating Crestor 10 MG 3 days a week dosing without issue.  Labs: CMP and lipid panel today. 

## 2022-06-21 NOTE — Assessment & Plan Note (Signed)
Chronic, stable.  Continue daily supplement and recheck B12 level today, adjust as needed. 

## 2022-06-21 NOTE — Assessment & Plan Note (Signed)
Chronic, ongoing.  Continue Prilosec every other day, is tolerating -- tried to lower further but had symptoms with this.  Risks of PPI use were discussed with patient including bone loss, C. Diff diarrhea, pneumonia, infections, CKD, electrolyte abnormalities.  Verbalizes understanding and chooses to continue the medication.  Mag level annually.  

## 2022-06-22 LAB — CBC WITH DIFFERENTIAL/PLATELET
Basophils Absolute: 0 10*3/uL (ref 0.0–0.2)
Basos: 0 %
EOS (ABSOLUTE): 0.1 10*3/uL (ref 0.0–0.4)
Eos: 2 %
Hematocrit: 46.4 % (ref 34.0–46.6)
Hemoglobin: 15.1 g/dL (ref 11.1–15.9)
Immature Grans (Abs): 0 10*3/uL (ref 0.0–0.1)
Immature Granulocytes: 0 %
Lymphocytes Absolute: 2.1 10*3/uL (ref 0.7–3.1)
Lymphs: 27 %
MCH: 29.6 pg (ref 26.6–33.0)
MCHC: 32.5 g/dL (ref 31.5–35.7)
MCV: 91 fL (ref 79–97)
Monocytes Absolute: 0.5 10*3/uL (ref 0.1–0.9)
Monocytes: 7 %
Neutrophils Absolute: 4.9 10*3/uL (ref 1.4–7.0)
Neutrophils: 64 %
Platelets: 214 10*3/uL (ref 150–450)
RBC: 5.1 x10E6/uL (ref 3.77–5.28)
RDW: 13 % (ref 11.7–15.4)
WBC: 7.6 10*3/uL (ref 3.4–10.8)

## 2022-06-22 LAB — LIPID PANEL W/O CHOL/HDL RATIO
Cholesterol, Total: 182 mg/dL (ref 100–199)
HDL: 59 mg/dL (ref >39)
LDL Chol Calc (NIH): 90 mg/dL (ref 0–99)
Triglycerides: 194 mg/dL — ABNORMAL HIGH (ref 0–149)
VLDL Cholesterol Cal: 33 mg/dL (ref 5–40)

## 2022-06-22 LAB — TSH: TSH: 1.94 u[IU]/mL (ref 0.450–4.500)

## 2022-06-22 LAB — COMPREHENSIVE METABOLIC PANEL
ALT: 17 IU/L (ref 0–32)
AST: 19 IU/L (ref 0–40)
Albumin/Globulin Ratio: 2 (ref 1.2–2.2)
Albumin: 4.7 g/dL (ref 3.8–4.8)
Alkaline Phosphatase: 83 IU/L (ref 44–121)
BUN/Creatinine Ratio: 24 (ref 12–28)
BUN: 21 mg/dL (ref 8–27)
Bilirubin Total: 0.6 mg/dL (ref 0.0–1.2)
CO2: 25 mmol/L (ref 20–29)
Calcium: 10.1 mg/dL (ref 8.7–10.3)
Chloride: 102 mmol/L (ref 96–106)
Creatinine, Ser: 0.86 mg/dL (ref 0.57–1.00)
Globulin, Total: 2.4 g/dL (ref 1.5–4.5)
Glucose: 107 mg/dL — ABNORMAL HIGH (ref 70–99)
Potassium: 4.1 mmol/L (ref 3.5–5.2)
Sodium: 140 mmol/L (ref 134–144)
Total Protein: 7.1 g/dL (ref 6.0–8.5)
eGFR: 72 mL/min/{1.73_m2} (ref >59)

## 2022-06-22 LAB — VITAMIN D 25 HYDROXY (VIT D DEFICIENCY, FRACTURES): Vit D, 25-Hydroxy: 42.6 ng/mL (ref 30.0–100.0)

## 2022-06-22 LAB — VITAMIN B12: Vitamin B-12: >2000 pg/mL — ABNORMAL HIGH (ref 232–1245)

## 2022-06-22 NOTE — Progress Notes (Signed)
Contacted via MyChart   Good evening Marie Perry, your labs have returned: - Kidney function, creatinine and eGFR, remains normal, as is liver function, AST and ALT.  - Cholesterol levels show LDL 90, continue Rosuvastatin at this time and we may try to increase a little in future. - Vitamin B12 level is a bit above goal, I recommend cut back on B12 to every other day. - Remainder of labs are nice and stable.  Any questions? Keep being amazing!!  Thank you for allowing me to participate in your care.  I appreciate you. Kindest regards, Taevion Sikora

## 2022-07-06 DIAGNOSIS — Z23 Encounter for immunization: Secondary | ICD-10-CM | POA: Diagnosis not present

## 2022-07-13 ENCOUNTER — Other Ambulatory Visit: Payer: Self-pay | Admitting: Nurse Practitioner

## 2022-07-13 DIAGNOSIS — F332 Major depressive disorder, recurrent severe without psychotic features: Secondary | ICD-10-CM

## 2022-07-13 DIAGNOSIS — F4321 Adjustment disorder with depressed mood: Secondary | ICD-10-CM

## 2022-07-13 NOTE — Telephone Encounter (Signed)
Requested medications are due for refill today.  no  Requested medications are on the active medications list.  yes  Last refill. 06/21/2022 #30 4 rf  Future visit scheduled.   yes  Notes to clinic.  Pt is requesting a 90 day supply.    Requested Prescriptions  Pending Prescriptions Disp Refills   buPROPion (WELLBUTRIN XL) 150 MG 24 hr tablet [Pharmacy Med Name: BUPROPION HCL XL 150 MG TABLET] 90 tablet 2    Sig: TAKE 1 TABLET BY MOUTH EVERY DAY     Psychiatry: Antidepressants - bupropion Passed - 07/13/2022 10:31 AM      Passed - Cr in normal range and within 360 days    Creatinine, Ser  Date Value Ref Range Status  06/21/2022 0.86 0.57 - 1.00 mg/dL Final         Passed - AST in normal range and within 360 days    AST  Date Value Ref Range Status  06/21/2022 19 0 - 40 IU/L Final         Passed - ALT in normal range and within 360 days    ALT  Date Value Ref Range Status  06/21/2022 17 0 - 32 IU/L Final         Passed - Completed PHQ-2 or PHQ-9 in the last 360 days      Passed - Last BP in normal range    BP Readings from Last 1 Encounters:  06/21/22 125/81         Passed - Valid encounter within last 6 months    Recent Outpatient Visits           3 weeks ago Severe episode of recurrent major depressive disorder, without psychotic features (HCC)   [place] [facility] Hugo, Jolene T, NP   10 months ago Severe episode of recurrent major depressive disorder, without psychotic features (HCC)   [place] [facility] Matthews, Graceton T, NP   1 year ago Severe episode of recurrent major depressive disorder, without psychotic features (HCC)   [place] [facility] Norris, Corrie Dandy T, NP   1 year ago Acute cystitis without hematuria   [place] [facility] Mecum, Oswaldo Conroy, PA-C   1 year ago Dysuria   [place] [facility] Gabriel Cirri, NP       Future Appointments             In 2 weeks  Cannady, Dorie Rank, NP [place] [facility]

## 2022-07-26 ENCOUNTER — Ambulatory Visit (INDEPENDENT_AMBULATORY_CARE_PROVIDER_SITE_OTHER): Payer: Medicare Other | Admitting: Clinical

## 2022-07-26 DIAGNOSIS — F331 Major depressive disorder, recurrent, moderate: Secondary | ICD-10-CM | POA: Diagnosis not present

## 2022-07-26 NOTE — Progress Notes (Signed)
                Salil Raineri, LCSW 
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
## 2022-07-29 NOTE — Patient Instructions (Signed)
Managing Depression, Adult Depression is a mental health condition that affects your thoughts, feelings, and actions. Being diagnosed with depression can bring you relief if you did not know why you have felt or behaved a certain way. It could also leave you feeling overwhelmed. Finding ways to manage your symptoms can help you feel more positive about your future. How to manage lifestyle changes Being depressed is difficult. Depression can increase the level of everyday stress. Stress can make depression symptoms worse. You may believe your symptoms cannot be managed or will never improve. However, there are many things you can try to help manage your symptoms. There is hope. Managing stress  Stress is your body's reaction to life changes and events, both good and bad. Stress can add to your feelings of depression. Learning to manage your stress can help lessen your feelings of depression. Try some of the following approaches to reducing your stress (stress reduction techniques): Listen to music that you enjoy and that inspires you. Try using a meditation app or take a meditation class. Develop a practice that helps you connect with your spiritual self. Walk in nature, pray, or go to a place of worship. Practice deep breathing. To do this, inhale slowly through your nose. Pause at the top of your inhale for a few seconds and then exhale slowly, letting yourself relax. Repeat this three or four times. Practice yoga to help relax and work your muscles. Choose a stress reduction technique that works for you. These techniques take time and practice to develop. Set aside 5-15 minutes a day to do them. Therapists can offer training in these techniques. Do these things to help manage stress: Keep a journal. Know your limits. Set healthy boundaries for yourself and others, such as saying "no" when you think something is too much. Pay attention to how you react to certain situations. You may not be able to  control everything, but you can change your reaction. Add humor to your life by watching funny movies or shows. Make time for activities that you enjoy and that relax you. Spend less time using electronics, especially at night before bed. The light from screens can make your brain think it is time to get up rather than go to bed.  Medicines Medicines, such as antidepressants, are often a part of treatment for depression. Talk with your pharmacist or health care provider about all the medicines, supplements, and herbal products that you take, their possible side effects, and what medicines and other products are safe to take together. Make sure to report any side effects you may have to your health care provider. Relationships Your health care provider may suggest family therapy, couples therapy, or individual therapy as part of your treatment. How to recognize changes Everyone responds differently to treatment for depression. As you recover from depression, you may start to: Have more interest in doing activities. Feel more hopeful. Have more energy. Eat a more regular amount of food. Have better mental focus. It is important to recognize if your depression is not getting better or is getting worse. The symptoms you had in the beginning may return, such as: Feeling tired. Eating too much or too little. Sleeping too much or too little. Feeling restless, agitated, or hopeless. Trouble focusing or making decisions. Having unexplained aches and pains. Feeling irritable, angry, or aggressive. If you or your family members notice these symptoms coming back, let your health care provider know right away. Follow these instructions at home: Activity Try to   get some form of exercise each day, such as walking. Try yoga, mindfulness, or other stress reduction techniques. Participate in group activities if you are able. Lifestyle Get enough sleep. Cut down on or stop using caffeine, tobacco,  alcohol, and any other harmful substances. Eat a healthy diet that includes plenty of vegetables, fruits, whole grains, low-fat dairy products, and lean protein. Limit foods that are high in solid fats, added sugar, or salt (sodium). General instructions Take over-the-counter and prescription medicines only as told by your health care provider. Keep all follow-up visits. It is important for your health care provider to check on your mood, behavior, and medicines. Your health care provider may need to make changes to your treatment. Where to find support Talking to others  Friends and family members can be sources of support and guidance. Talk to trusted friends or family members about your condition. Explain your symptoms and let them know that you are working with a health care provider to treat your depression. Tell friends and family how they can help. Finances Find mental health providers that fit with your financial situation. Talk with your health care provider if you are worried about access to food, housing, or medicine. Call your insurance company to learn about your co-pays and prescription plan. Where to find more information You can find support in your area from: Anxiety and Depression Association of America (ADAA): adaa.org Mental Health America: mentalhealthamerica.net National Alliance on Mental Illness: nami.org Contact a health care provider if: You stop taking your antidepressant medicines, and you have any of these symptoms: Nausea. Headache. Light-headedness. Chills and body aches. Not being able to sleep (insomnia). You or your friends and family think your depression is getting worse. Get help right away if: You have thoughts of hurting yourself or others. Get help right away if you feel like you may hurt yourself or others, or have thoughts about taking your own life. Go to your nearest emergency room or: Call 911. Call the National Suicide Prevention Lifeline at  1-800-273-8255 or 988. This is open 24 hours a day. Text the Crisis Text Line at 741741. This information is not intended to replace advice given to you by your health care provider. Make sure you discuss any questions you have with your health care provider. Document Revised: 06/06/2021 Document Reviewed: 06/06/2021 Elsevier Patient Education  2024 Elsevier Inc.  

## 2022-08-02 ENCOUNTER — Ambulatory Visit (INDEPENDENT_AMBULATORY_CARE_PROVIDER_SITE_OTHER): Payer: BLUE CROSS/BLUE SHIELD | Admitting: Nurse Practitioner

## 2022-08-02 ENCOUNTER — Encounter: Payer: Self-pay | Admitting: Nurse Practitioner

## 2022-08-02 VITALS — BP 115/74 | HR 89 | Temp 98.0°F | Ht 66.5 in | Wt 195.4 lb

## 2022-08-02 DIAGNOSIS — F4321 Adjustment disorder with depressed mood: Secondary | ICD-10-CM | POA: Diagnosis not present

## 2022-08-02 DIAGNOSIS — F332 Major depressive disorder, recurrent severe without psychotic features: Secondary | ICD-10-CM

## 2022-08-02 MED ORDER — BUPROPION HCL ER (XL) 300 MG PO TB24: 300.0000 mg | ORAL_TABLET | Freq: Every day | ORAL | 4 refills | Status: DC

## 2022-08-02 NOTE — Assessment & Plan Note (Signed)
Chronic, exacerbated by recent loss of mother whom she lived with for 30 years.  Denies SI/HI.  Will continue current medication regimen at this time and adjust as needed, but will increase Wellbutrin XL to 300 MG daily as this is offering benefit to mood and scores are improving.  No ADR reported.  Continue therapy.  Educated her on this medication and use + side effects.  Return in 6 weeks for follow-up,

## 2022-08-02 NOTE — Progress Notes (Signed)
BP 115/74   Pulse 89   Temp 98 F (36.7 C) (Oral)   Ht 5' 6.5" (1.689 m)   Wt 195 lb 6.4 oz (88.6 kg)   LMP  (LMP Unknown)   SpO2 98%   BMI 31.07 kg/m    Subjective:    Patient ID: Marie Perry, female    DOB: 1951-01-20, 72 y.o.   MRN: [id number]  HPI: Marie Perry is a 72 y.o. female  Chief Complaint  Patient presents with   Depression    (Grief) Pt states she is doing somewhat better at this time.   DEPRESSION Added on Wellbutrin 06/21/22 and referral to therapy placed.  Had visit with therapist on 07/26/22. 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 Continues Duloxetine 60 MG daily, Wellbutrin XL 150 MG daily, and Buspar 10 MG BID.  Is tolerating Wellbutrin without ADR.  Mood status: stable Satisfied with current treatment?: yes Symptom severity: moderate  Duration of current treatment : chronic Side effects: no Medication compliance: good compliance Psychotherapy/counseling: yes current Previous psychiatric medications: multiple medications Depressed mood:  occasional Anxious mood: occasional Anhedonia: no Significant weight loss or gain: no Insomnia: yes hard to stay asleep Fatigue:  occasional Feelings of worthlessness or guilt: no Impaired concentration/indecisiveness:  sometimes Suicidal ideations: no Hopelessness: no Crying spells: yes    08/02/2022   10:29 AM 06/21/2022   10:44 AM 04/10/2022   11:36 AM 09/12/2021   10:38 AM 06/12/2021    9:29 AM  Depression screen PHQ 2/9  Decreased Interest 2 3  1 1   Down, Depressed, Hopeless 2 3 1 1 1   PHQ - 2 Score 4 6 1 2 2   Altered sleeping 3 3 1 1 1   Tired, decreased energy 2 2 1 2 1   Change in appetite 1 2  1 1   Feeling bad or failure about yourself  1 1  1 1   Trouble concentrating 1 1  1 1   Moving slowly or fidgety/restless 0 1  1 0  Suicidal thoughts 0 1  0 0  PHQ-9 Score  12 17 3 9 7   Difficult doing work/chores Somewhat difficult Very difficult          08/02/2022   10:30 AM 06/21/2022   10:44 AM 09/12/2021   10:39 AM 06/12/2021    9:30 AM  GAD 7 : Generalized Anxiety Score  Nervous, Anxious, on Edge 1 2 1 1   Control/stop worrying 2 3 1  0  Worry too much - different things 1 2 2 1   Trouble relaxing 0 2 1 0  Restless 0 1 0 0  Easily annoyed or irritable 0 0 1 0  Afraid - awful might happen 0 1 1 0  Total GAD 7 Score 4 11 7 2   Anxiety Difficulty Not difficult at all Somewhat difficult  Somewhat difficult   Relevant past medical, surgical, family and social history reviewed and updated as indicated. Interim medical history since our last visit reviewed. Allergies and medications reviewed and updated.  Review of Systems  Constitutional:  Negative for activity change, appetite change, diaphoresis, fatigue and fever.  Respiratory:  Negative for cough, chest tightness and shortness of breath.   Cardiovascular:  Negative for chest pain, palpitations and leg swelling.  Gastrointestinal: Negative.   Neurological: Negative.   Psychiatric/Behavioral:  Positive for decreased concentration and sleep disturbance. Negative for  self-injury and suicidal ideas. The patient is nervous/anxious.     Per HPI unless specifically indicated above     Objective:    BP 115/74   Pulse 89   Temp 98 F (36.7 C) (Oral)   Ht 5' 6.5" (1.689 m)   Wt 195 lb 6.4 oz (88.6 kg)   LMP  (LMP Unknown)   SpO2 98%   BMI 31.07 kg/m   Wt Readings from Last 3 Encounters:  08/02/22 195 lb 6.4 oz (88.6 kg)  06/21/22 194 lb 1.6 oz (88 kg)  05/25/22 193 lb (87.5 kg)    Physical Exam Vitals and nursing note reviewed.  Constitutional:      General: She is awake. She is not in acute distress.    Appearance: She is well-developed and well-groomed. She is obese. She is not ill-appearing or toxic-appearing.  HENT:     Head: Normocephalic.     Right Ear: Hearing and external ear normal.      Left Ear: Hearing and external ear normal.  Eyes:     General: Lids are normal.        Right eye: No discharge.        Left eye: No discharge.     Conjunctiva/sclera: Conjunctivae normal.     Pupils: Pupils are equal, round, and reactive to light.  Neck:     Thyroid: No thyromegaly.     Vascular: No carotid bruit.  Cardiovascular:     Rate and Rhythm: Normal rate and regular rhythm.     Heart sounds: Normal heart sounds. No murmur heard.    No gallop.  Pulmonary:     Effort: Pulmonary effort is normal. No accessory muscle usage or respiratory distress.     Breath sounds: Normal breath sounds.  Abdominal:     General: Bowel sounds are normal. There is no distension.     Palpations: Abdomen is soft.     Tenderness: There is no abdominal tenderness.  Musculoskeletal:     Cervical back: Normal range of motion and neck supple.     Right lower leg: No edema.     Left lower leg: No edema.  Lymphadenopathy:     Cervical: No cervical adenopathy.  Skin:    General: Skin is warm and dry.  Neurological:     Mental Status: She is alert and oriented to person, place, and time.     Deep Tendon Reflexes: Reflexes are normal and symmetric.     Reflex Scores:      Brachioradialis reflexes are 2+ on the right side and 2+ on the left side.      Patellar reflexes are 2+ on the right side and 2+ on the left side. Psychiatric:        Attention and Perception: Attention normal.        Mood and Affect: Mood normal.        Speech: Speech normal.        Behavior: Behavior normal. Behavior is cooperative.        Thought Content: Thought content normal.     Results for orders placed or performed in visit on 06/21/22  CBC with Differential/Platelet  Result Value Ref Range   WBC 7.6 3.4 - 10.8 x10E3/uL   RBC 5.10 3.77 - 5.28 x10E6/uL   Hemoglobin 15.1 11.1 - 15.9 g/dL   Hematocrit 16.1 09.6 - 46.6 %   MCV 91 79 - 97 fL   MCH 29.6 26.6 - 33.0 pg   MCHC 32.5  31.5 - 35.7 g/dL   RDW 16.1 09.6 -  04.5 %   Platelets 214 150 - 450 x10E3/uL   Neutrophils 64 Not Estab. %   Lymphs 27 Not Estab. %   Monocytes 7 Not Estab. %   Eos 2 Not Estab. %   Basos 0 Not Estab. %   Neutrophils Absolute 4.9 1.4 - 7.0 x10E3/uL   Lymphocytes Absolute 2.1 0.7 - 3.1 x10E3/uL   Monocytes Absolute 0.5 0.1 - 0.9 x10E3/uL   EOS (ABSOLUTE) 0.1 0.0 - 0.4 x10E3/uL   Basophils Absolute 0.0 0.0 - 0.2 x10E3/uL   Immature Granulocytes 0 Not Estab. %   Immature Grans (Abs) 0.0 0.0 - 0.1 x10E3/uL  Comprehensive metabolic panel  Result Value Ref Range   Glucose 107 (H) 70 - 99 mg/dL   BUN 21 8 - 27 mg/dL   Creatinine, Ser 4.09 0.57 - 1.00 mg/dL   eGFR 72 >81 XB/JYN/8.29   BUN/Creatinine Ratio 24 12 - 28   Sodium 140 134 - 144 mmol/L   Potassium 4.1 3.5 - 5.2 mmol/L   Chloride 102 96 - 106 mmol/L   CO2 25 20 - 29 mmol/L   Calcium 10.1 8.7 - 10.3 mg/dL   Total Protein 7.1 6.0 - 8.5 g/dL   Albumin 4.7 3.8 - 4.8 g/dL   Globulin, Total 2.4 1.5 - 4.5 g/dL   Albumin/Globulin Ratio 2.0 1.2 - 2.2   Bilirubin Total 0.6 0.0 - 1.2 mg/dL   Alkaline Phosphatase 83 44 - 121 IU/L   AST 19 0 - 40 IU/L   ALT 17 0 - 32 IU/L  Lipid Panel w/o Chol/HDL Ratio  Result Value Ref Range   Cholesterol, Total 182 100 - 199 mg/dL   Triglycerides 562 (H) 0 - 149 mg/dL   HDL 59 >13 mg/dL   VLDL Cholesterol Cal 33 5 - 40 mg/dL   LDL Chol Calc (NIH) 90 0 - 99 mg/dL  TSH  Result Value Ref Range   TSH 1.940 0.450 - 4.500 uIU/mL  VITAMIN D 25 Hydroxy (Vit-D Deficiency, Fractures)  Result Value Ref Range   Vit D, 25-Hydroxy 42.6 30.0 - 100.0 ng/mL  Vitamin B12  Result Value Ref Range   Vitamin B-12 >2000 (H) 232 - 1245 pg/mL      Assessment & Plan:   Problem List Items Addressed This Visit       Other   Grief reaction    Refer to depression plan of care.      Recurrent major depression-severe (HCC) - Primary    Chronic, exacerbated by recent loss of mother whom she lived with for 30 years.  Denies SI/HI.  Will continue  current medication regimen at this time and adjust as needed, but will increase Wellbutrin XL to 300 MG daily as this is offering benefit to mood and scores are improving.  No ADR reported.  Continue therapy.  Educated her on this medication and use + side effects.  Return in 6 weeks for follow-up,      Relevant Medications   buPROPion (WELLBUTRIN XL) 300 MG 24 hr tablet     Follow up plan: Return in about 6 weeks (around 09/13/2022) for DEPRESSION -- Wellbutrin XL increased to 300 MG.

## 2022-08-02 NOTE — Assessment & Plan Note (Signed)
Refer to depression plan of care. 

## 2022-08-10 ENCOUNTER — Ambulatory Visit: Payer: Medicare Other | Admitting: Clinical

## 2022-08-10 DIAGNOSIS — F331 Major depressive disorder, recurrent, moderate: Secondary | ICD-10-CM
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
## 2022-08-17 ENCOUNTER — Ambulatory Visit (INDEPENDENT_AMBULATORY_CARE_PROVIDER_SITE_OTHER): Payer: Medicare Other | Admitting: Clinical

## 2022-08-17 DIAGNOSIS — F331 Major depressive disorder, recurrent, moderate: Secondary | ICD-10-CM | POA: Diagnosis not present
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
## 2022-09-13 NOTE — Patient Instructions (Signed)
Be Involved in Caring For Your Health:  Taking Medications When medications are taken as directed, they can greatly improve your health. But if they are not taken as prescribed, they may not work. In some cases, not taking them correctly can be harmful. To help ensure your treatment remains effective and safe, understand your medications and how to take them. Bring your medications to each visit for review by your provider.  Your lab results, notes, and after visit summary will be available on My Chart. We strongly encourage you to use this feature. If lab results are abnormal the clinic will contact you with the appropriate steps. If the clinic does not contact you assume the results are satisfactory. You can always view your results on My Chart. If you have questions regarding your health or results, please contact the clinic during office hours. You can also ask questions on My Chart.  We at [facility] are grateful that you chose [place] to provide your care. We strive to provide evidence-based and compassionate care and are always looking for feedback. If you get a survey from the clinic please complete this so we can hear your opinions.  Managing Depression, Adult Depression is a mental health condition that affects your thoughts, feelings, and actions. Being diagnosed with depression can bring you relief if you did not know why you have felt or behaved a certain way. It could also leave you feeling overwhelmed. Finding ways to manage your symptoms can help you feel more positive about your future. How to manage lifestyle changes Being depressed is difficult. Depression can increase the level of everyday stress. Stress can make depression symptoms worse. You may believe your symptoms cannot be managed or will never improve. However, there are many things you can try to help manage your symptoms. There is hope. Managing stress  Stress is your body's reaction to life changes and events,  both good and bad. Stress can add to your feelings of depression. Learning to manage your stress can help lessen your feelings of depression. Try some of the following approaches to reducing your stress (stress reduction techniques): Listen to music that you enjoy and that inspires you. Try using a meditation app or take a meditation class. Develop a practice that helps you connect with your spiritual self. Walk in nature, pray, or go to a place of worship. Practice deep breathing. To do this, inhale slowly through your nose. Pause at the top of your inhale for a few seconds and then exhale slowly, letting yourself relax. Repeat this three or four times. Practice yoga to help relax and work your muscles. Choose a stress reduction technique that works for you. These techniques take time and practice to develop. Set aside 5-15 minutes a day to do them. Therapists can offer training in these techniques. Do these things to help manage stress: Keep a journal. Know your limits. Set healthy boundaries for yourself and others, such as saying "no" when you think something is too much. Pay attention to how you react to certain situations. You may not be able to control everything, but you can change your reaction. Add humor to your life by watching funny movies or shows. Make time for activities that you enjoy and that relax you. Spend less time using electronics, especially at night before bed. The light from screens can make your brain think it is time to get up rather than go to bed.  Medicines Medicines, such as antidepressants, are often a part of  treatment for depression. Talk with your pharmacist or health care provider about all the medicines, supplements, and herbal products that you take, their possible side effects, and what medicines and other products are safe to take together. Make sure to report any side effects you may have to your health care provider. Relationships Your health care  provider may suggest family therapy, couples therapy, or individual therapy as part of your treatment. How to recognize changes Everyone responds differently to treatment for depression. As you recover from depression, you may start to: Have more interest in doing activities. Feel more hopeful. Have more energy. Eat a more regular amount of food. Have better mental focus. It is important to recognize if your depression is not getting better or is getting worse. The symptoms you had in the beginning may return, such as: Feeling tired. Eating too much or too little. Sleeping too much or too little. Feeling restless, agitated, or hopeless. Trouble focusing or making decisions. Having unexplained aches and pains. Feeling irritable, angry, or aggressive. If you or your family members notice these symptoms coming back, let your health care provider know right away. Follow these instructions at home: Activity Try to get some form of exercise each day, such as walking. Try yoga, mindfulness, or other stress reduction techniques. Participate in group activities if you are able. Lifestyle Get enough sleep. Cut down on or stop using caffeine, tobacco, alcohol, and any other harmful substances. Eat a healthy diet that includes plenty of vegetables, fruits, whole grains, low-fat dairy products, and lean protein. Limit foods that are high in solid fats, added sugar, or salt (sodium). General instructions Take over-the-counter and prescription medicines only as told by your health care provider. Keep all follow-up visits. It is important for your health care provider to check on your mood, behavior, and medicines. Your health care provider may need to make changes to your treatment. Where to find support Talking to others  Friends and family members can be sources of support and guidance. Talk to trusted friends or family members about your condition. Explain your symptoms and let them know that you  are working with a health care provider to treat your depression. Tell friends and family how they can help. Finances Find mental health providers that fit with your financial situation. Talk with your health care provider if you are worried about access to food, housing, or medicine. Call your insurance company to learn about your co-pays and prescription plan. Where to find more information You can find support in your area from: Anxiety and Depression Association of America (ADAA): adaa.org Mental Health [place]: mentalhealthamerica.net [employer] on Mental Illness: nami.org Contact a health care provider if: You stop taking your antidepressant medicines, and you have any of these symptoms: Nausea. Headache. Light-headedness. Chills and body aches. Not being able to sleep (insomnia). You or your friends and family think your depression is getting worse. Get help right away if: You have thoughts of hurting yourself or others. Get help right away if you feel like you may hurt yourself or others, or have thoughts about taking your own life. Go to your nearest emergency room or: Call 911. Call the National Suicide Prevention Lifeline at 6623140421 or 988. This is open 24 hours a day. Text the Crisis Text Line at 212-242-5991. This information is not intended to replace advice given to you by your health care provider. Make sure you discuss any questions you have with your health care provider. Document Revised: 06/06/2021 Document Reviewed:  06/06/2021 Elsevier Patient Education  2024 [employer].

## 2022-09-14 ENCOUNTER — Ambulatory Visit: Payer: Medicare Other | Admitting: Clinical

## 2022-09-14 DIAGNOSIS — F331 Major depressive disorder, recurrent, moderate: Secondary | ICD-10-CM | POA: Diagnosis not present

## 2022-09-14 NOTE — Progress Notes (Signed)
[facility] Counselor/Therapist Progress Note  Patient ID: Marie Perry, MRN: [id number],    Date: 09/14/2022  Time Spent: 10:31am - 11:41am : 70 minutes   Treatment Type: Individual Therapy  Reported Symptoms: Patient reported feelings of sadness  Mental Status Exam: Appearance:  Neat and Well Groomed     Behavior: Appropriate  Motor: Normal  Speech/Language:  Clear and Coherent  Affect: Tearful  Mood: sad  Thought process: normal  Thought content:   WNL  Sensory/Perceptual disturbances:   WNL  Orientation: oriented to person, place, and situation  Attention: Good  Concentration: Good  Memory: WNL  Fund of knowledge:  Good  Insight:   Good  Judgment:  Good  Impulse Control: Good   Risk Assessment: Danger to Self:  No Patient denied current suicidal ideation  Self-injurious Behavior: No Danger to Others: No Patient denied current homicidal ideation Duty to Warn:no Physical Aggression / Violence:No  Access to Firearms a concern: No  Gang Involvement:No   Subjective: Patient stated, "all and all pretty good, feeling better" in response to mood since last session. Patient reported she has thought about her mother this week and her mother's birthday tomorrow. Patient reported her mother's birthday has triggered feelings of sadness his week. Patient reported she tries to keep herself busy with activities and reported she finds this helpful. Patient stated, "I think when Im tired I've been more emotional". Patient reported statements made by others about her mother's passing triggers sadness.  During session, patient shared positive memories/positive qualities about her mother. Patient reported her mother had a "sharp sense of humor", was nurturing, and was considerate of others feelings. Patient reported she questions her advocacy efforts during the last month of her mother's life and reported frustration accessing hospice services during the last month of her  mother's life.   Interventions: Cognitive Behavioral Therapy. Clinician conducted session via caregility video from clinician's home office. Patient provided verbal consent to proceed with telehealth session and is aware of limitations of telephone or video visits. Patient participated in session from patient's home. Assisted patient in exploring triggers for sadness. Provided supportive therapy as patient shared memories/positive qualities about her mother, frustrations accessing hospice services, and the days prior to her mother's passing. Provided psycho education related to anticipatory grief and grief. Normalized patient's feelings as it relates to grief and the loss of her mother. Provided reflective listening and validation. Clinician requested patient complete grief journal for homework.    Collaboration of Care: Other not required at this time   Diagnosis:  Major depressive disorder, recurrent episode, moderate (HCC)     Plan: Patient is to utilize [employer] Therapy, thought re-framing, behavioral activation, and coping strategies to decrease symptoms associated with their diagnosis. Frequency: bi-weekly  Modality: individual      Long-term goal:   Reduce overall level, frequency, and intensity of the feelings of depression as evidenced by decreased crying, difficulty falling asleep and staying asleep, changes in appetite, lack of energy, fatigue, lack of motivation, difficulty focusing, and loss of interest from 7 days/week to 2 to 3 days/week per patient report for at least 3 consecutive months. Target Date: 08/17/23  Progress: progressing    Short-term goal:  Begin a healthy grieving process around the loss of patient's mother Target Date: 02/23/23  Progress: progressing    Decrease negative thoughts associated with patient's mother's death Target Date: 2023/02/23  Progress: progressing    Identify, challenge, and replace negative thought patterns that contribute to  feelings  of depression with positive thoughts and beliefs per patient's report Target Date: 02/17/23  Progress: progressing    Increase activities that patient enjoys to support increased motivation and mood, such as, socializing with friends weekly, completing puzzles, playing games. Target Date: 02/17/23  Progress: progressing                              [name], LCSW

## 2022-09-14 NOTE — Progress Notes (Signed)
                 , LCSW 

## 2022-09-21 ENCOUNTER — Encounter: Payer: Self-pay | Admitting: Nurse Practitioner

## 2022-09-21 ENCOUNTER — Ambulatory Visit (INDEPENDENT_AMBULATORY_CARE_PROVIDER_SITE_OTHER): Payer: Medicare Other | Admitting: Nurse Practitioner

## 2022-09-21 VITALS — BP 127/77 | HR 86 | Temp 97.9°F | Ht 66.5 in | Wt 197.2 lb

## 2022-09-21 DIAGNOSIS — F332 Major depressive disorder, recurrent severe without psychotic features: Secondary | ICD-10-CM

## 2022-09-21 DIAGNOSIS — F4321 Adjustment disorder with depressed mood: Secondary | ICD-10-CM | POA: Diagnosis not present

## 2022-09-21 DIAGNOSIS — F5104 Psychophysiologic insomnia: Secondary | ICD-10-CM | POA: Diagnosis not present

## 2022-09-21 DIAGNOSIS — G4700 Insomnia, unspecified: Secondary | ICD-10-CM | POA: Insufficient documentation

## 2022-09-21 MED ORDER — TRAZODONE HCL 50 MG PO TABS: 25.0000 mg | ORAL_TABLET | Freq: Every evening | ORAL | 3 refills | Status: DC | PRN

## 2022-09-21 NOTE — Assessment & Plan Note (Signed)
Chronic, exacerbated by recent loss of mother 7 months ago whom she lived with for 30 years.  Denies SI/HI.  Will continue current medication regimen at this time and adjust as needed,Wellbutrin offering benefit to overall mood.  She does gets some sweats with SNRI, but overall tolerating regimen.  Will add on Trazodone to use as needed for sleep, educated her on this.  Continue therapy.  Educated her on this medication and use + side effects.  Return in 6 weeks for follow-up,

## 2022-09-21 NOTE — Assessment & Plan Note (Signed)
Refer to depression plan of care. 

## 2022-09-21 NOTE — Progress Notes (Signed)
BP 127/77   Pulse 86   Temp 97.9 F (36.6 C) (Oral)   Ht 5' 6.5" (1.689 m)   Wt 197 lb 3.2 oz (89.4 kg)   LMP  (LMP Unknown)   SpO2 97%   BMI 31.35 kg/m    Subjective:    Patient ID: Marie Perry, female    DOB: 12-25-1950, 72 y.o.   MRN: [id number]  HPI: Marie Perry is a 72 y.o. female  Chief Complaint  Patient presents with   Depression   DEPRESSION Increase Wellbutrin XL to 300 MG on 08/02/22.  Had visit with therapist on 09/14/22. 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 Continues Duloxetine 60 MG daily, Wellbutrin XL 300 MG daily, and Buspar 10 MG BID.  Mood status: stable Satisfied with current treatment?: yes Symptom severity: moderate  Duration of current treatment : chronic Side effects: no Medication compliance: good compliance Psychotherapy/counseling: yes current Previous psychiatric medications: multiple medications Depressed mood: improving Anxious mood: occasional Anhedonia: improved Significant weight loss or gain: no Insomnia: yes hard to fall asleep -- has been taking Melatonin + Magnesium Fatigue: occasional Feelings of worthlessness or guilt: no Impaired concentration/indecisiveness: improving Suicidal ideations: no Hopelessness: no Crying spells: yes    09/21/2022   10:13 AM 08/02/2022   10:29 AM 06/21/2022   10:44 AM 04/10/2022   11:36 AM 09/12/2021   10:38 AM  Depression screen PHQ 2/9  Decreased Interest 1 2 3  1   Down, Depressed, Hopeless 1 2 3 1 1   PHQ - 2 Score 2 4 6 1 2   Altered sleeping 2 3 3 1 1   Tired, decreased energy 2 2 2 1 2   Change in appetite 2 1 2  1   Feeling bad or failure about yourself  1 1 1  1   Trouble concentrating 1 1 1  1   Moving slowly or fidgety/restless 0 0 1  1  Suicidal thoughts 0 0 1  0  PHQ-9 Score 10 12 17 3 9   Difficult doing work/chores Somewhat difficult Somewhat  difficult Very difficult         09/21/2022   10:13 AM 08/02/2022   10:30 AM 06/21/2022   10:44 AM 09/12/2021   10:39 AM  GAD 7 : Generalized Anxiety Score  Nervous, Anxious, on Edge 0 1 2 1   Control/stop worrying 1 2 3 1   Worry too much - different things 1 1 2 2   Trouble relaxing 1 0 2 1  Restless 0 0 1 0  Easily annoyed or irritable 0 0 0 1  Afraid - awful might happen 0 0 1 1  Total GAD 7 Score 3 4 11 7   Anxiety Difficulty Not difficult at all Not difficult at all Somewhat difficult    Relevant past medical, surgical, family and social history reviewed and updated as indicated. Interim medical history since our last visit reviewed. Allergies and medications reviewed and updated.  Review of Systems  Constitutional:  Negative for activity change, appetite change, diaphoresis, fatigue and fever.  Respiratory:  Negative for cough, chest tightness and shortness of breath.   Cardiovascular:  Negative for chest pain, palpitations and leg swelling.  Gastrointestinal: Negative.   Neurological: Negative.   Psychiatric/Behavioral:  Positive for decreased concentration and sleep disturbance. Negative for self-injury and suicidal ideas. The patient is nervous/anxious.     Per HPI unless  specifically indicated above     Objective:    BP 127/77   Pulse 86   Temp 97.9 F (36.6 C) (Oral)   Ht 5' 6.5" (1.689 m)   Wt 197 lb 3.2 oz (89.4 kg)   LMP  (LMP Unknown)   SpO2 97%   BMI 31.35 kg/m   Wt Readings from Last 3 Encounters:  09/21/22 197 lb 3.2 oz (89.4 kg)  08/02/22 195 lb 6.4 oz (88.6 kg)  06/21/22 194 lb 1.6 oz (88 kg)    Physical Exam Vitals and nursing note reviewed.  Constitutional:      General: She is awake. She is not in acute distress.    Appearance: She is well-developed and well-groomed. She is obese. She is not ill-appearing or toxic-appearing.  HENT:     Head: Normocephalic.     Right Ear: Hearing and external ear normal.     Left Ear: Hearing and external ear  normal.  Eyes:     General: Lids are normal.        Right eye: No discharge.        Left eye: No discharge.     Conjunctiva/sclera: Conjunctivae normal.     Pupils: Pupils are equal, round, and reactive to light.  Neck:     Thyroid: No thyromegaly.     Vascular: No carotid bruit.  Cardiovascular:     Rate and Rhythm: Normal rate and regular rhythm.     Heart sounds: Normal heart sounds. No murmur heard.    No gallop.  Pulmonary:     Effort: Pulmonary effort is normal. No accessory muscle usage or respiratory distress.     Breath sounds: Normal breath sounds.  Abdominal:     General: Bowel sounds are normal. There is no distension.     Palpations: Abdomen is soft.     Tenderness: There is no abdominal tenderness.  Musculoskeletal:     Cervical back: Normal range of motion and neck supple.     Right lower leg: No edema.     Left lower leg: No edema.  Lymphadenopathy:     Cervical: No cervical adenopathy.  Skin:    General: Skin is warm and dry.  Neurological:     Mental Status: She is alert and oriented to person, place, and time.     Deep Tendon Reflexes: Reflexes are normal and symmetric.     Reflex Scores:      Brachioradialis reflexes are 2+ on the right side and 2+ on the left side.      Patellar reflexes are 2+ on the right side and 2+ on the left side. Psychiatric:        Attention and Perception: Attention normal.        Mood and Affect: Mood normal.        Speech: Speech normal.        Behavior: Behavior normal. Behavior is cooperative.        Thought Content: Thought content normal.     Results for orders placed or performed in visit on 06/21/22  CBC with Differential/Platelet  Result Value Ref Range   WBC 7.6 3.4 - 10.8 x10E3/uL   RBC 5.10 3.77 - 5.28 x10E6/uL   Hemoglobin 15.1 11.1 - 15.9 g/dL   Hematocrit 09.8 11.9 - 46.6 %   MCV 91 79 - 97 fL   MCH 29.6 26.6 - 33.0 pg   MCHC 32.5 31.5 - 35.7 g/dL   RDW 14.7 82.9 - 56.2 %  Platelets 214 150 - 450  x10E3/uL   Neutrophils 64 Not Estab. %   Lymphs 27 Not Estab. %   Monocytes 7 Not Estab. %   Eos 2 Not Estab. %   Basos 0 Not Estab. %   Neutrophils Absolute 4.9 1.4 - 7.0 x10E3/uL   Lymphocytes Absolute 2.1 0.7 - 3.1 x10E3/uL   Monocytes Absolute 0.5 0.1 - 0.9 x10E3/uL   EOS (ABSOLUTE) 0.1 0.0 - 0.4 x10E3/uL   Basophils Absolute 0.0 0.0 - 0.2 x10E3/uL   Immature Granulocytes 0 Not Estab. %   Immature Grans (Abs) 0.0 0.0 - 0.1 x10E3/uL  Comprehensive metabolic panel  Result Value Ref Range   Glucose 107 (H) 70 - 99 mg/dL   BUN 21 8 - 27 mg/dL   Creatinine, Ser 0.93 0.57 - 1.00 mg/dL   eGFR 72 >23 FT/DDU/2.02   BUN/Creatinine Ratio 24 12 - 28   Sodium 140 134 - 144 mmol/L   Potassium 4.1 3.5 - 5.2 mmol/L   Chloride 102 96 - 106 mmol/L   CO2 25 20 - 29 mmol/L   Calcium 10.1 8.7 - 10.3 mg/dL   Total Protein 7.1 6.0 - 8.5 g/dL   Albumin 4.7 3.8 - 4.8 g/dL   Globulin, Total 2.4 1.5 - 4.5 g/dL   Albumin/Globulin Ratio 2.0 1.2 - 2.2   Bilirubin Total 0.6 0.0 - 1.2 mg/dL   Alkaline Phosphatase 83 44 - 121 IU/L   AST 19 0 - 40 IU/L   ALT 17 0 - 32 IU/L  Lipid Panel w/o Chol/HDL Ratio  Result Value Ref Range   Cholesterol, Total 182 100 - 199 mg/dL   Triglycerides 542 (H) 0 - 149 mg/dL   HDL 59 >70 mg/dL   VLDL Cholesterol Cal 33 5 - 40 mg/dL   LDL Chol Calc (NIH) 90 0 - 99 mg/dL  TSH  Result Value Ref Range   TSH 1.940 0.450 - 4.500 uIU/mL  VITAMIN D 25 Hydroxy (Vit-D Deficiency, Fractures)  Result Value Ref Range   Vit D, 25-Hydroxy 42.6 30.0 - 100.0 ng/mL  Vitamin B12  Result Value Ref Range   Vitamin B-12 >2000 (H) 232 - 1245 pg/mL      Assessment & Plan:   Problem List Items Addressed This Visit       Other   Grief reaction    Refer to depression plan of care.      Insomnia    Chronic, ongoing.  Will add on Trazodone as needed to use.  Educated her on medication and use.        Recurrent major depression-severe (HCC) - Primary    Chronic, exacerbated by  recent loss of mother 7 months ago whom she lived with for 30 years.  Denies SI/HI.  Will continue current medication regimen at this time and adjust as needed,Wellbutrin offering benefit to overall mood.  She does gets some sweats with SNRI, but overall tolerating regimen.  Will add on Trazodone to use as needed for sleep, educated her on this.  Continue therapy.  Educated her on this medication and use + side effects.  Return in 6 weeks for follow-up,      Relevant Medications   traZODone (DESYREL) 50 MG tablet      Follow up plan: Return in about 6 weeks (around 11/02/2022) for MOOD AND SLEEP.

## 2022-09-21 NOTE — Assessment & Plan Note (Signed)
Chronic, ongoing.  Will add on Trazodone as needed to use.  Educated her on medication and use.

## 2022-09-28 ENCOUNTER — Ambulatory Visit (INDEPENDENT_AMBULATORY_CARE_PROVIDER_SITE_OTHER): Payer: Medicare Other | Admitting: Clinical

## 2022-09-28 DIAGNOSIS — F331 Major depressive disorder, recurrent, moderate: Secondary | ICD-10-CM | POA: Diagnosis not present

## 2022-09-28 NOTE — Progress Notes (Signed)
                Karen Sharpe, LCSW 

## 2022-09-28 NOTE — Progress Notes (Signed)
[facility] Counselor/Therapist Progress Note  Patient ID: Marie Perry, MRN: [id number],    Date: 09/28/2022  Time Spent: 9:38am - 10:25am : 47 minutes   Treatment Type: Individual Therapy  Reported Symptoms: Patient reported she experiences ruminating thoughts associated with the last few weeks of her mother's life and reported difficulty sleeping  Mental Status Exam: Appearance:  Neat and Well Groomed     Behavior: Appropriate  Motor: Normal  Speech/Language:  Clear and Coherent  Affect: Tearful  Mood: sad  Thought process: normal  Thought content:   WNL  Sensory/Perceptual disturbances:   WNL  Orientation: oriented to person, place, and situation  Attention: Good  Concentration: Good  Memory: WNL  Fund of knowledge:  Good  Insight:   Good  Judgment:  Good  Impulse Control: Good   Risk Assessment: Danger to Self:  No Patient denied current suicidal ideation  Self-injurious Behavior: No Danger to Others: No Patient denied current homicidal ideation Duty to Warn:no Physical Aggression / Violence:No  Access to Firearms a concern: No  Gang Involvement:No   Subjective: Patient stated she was "dreading this appointment" and stated, "because I have a tendency to get emotional". Patient reported feeling fatigued and reported difficulty falling asleep and staying asleep. Patient stated, "seems like I stall going to bed". Patient reported she avoids going to bed. Patient reported she experiences thoughts related to her mother prior to going to asleep. Patient stated, "my mind keeps going over her last few weeks of her life". Patient reported on the days she works she wakes up at 8:30am and reported she does not wake up at a consistent time on the days she does not work. Patient reported she goes to bed between 11:30pm - 3am. Patient reported she watches television prior to going to sleep. Patient reported she drinks decaffeinated tea and is trying to decrease  her soda intake.    Interventions: Cognitive Behavioral Therapy. Clinician conducted session via caregility video from clinician's home office. Patient provided verbal consent to proceed with telehealth session and is aware of limitations of telephone or video visits. Patient participated in session from patient's home. Discussed patient's report of difficulty sleeping. Explored barriers to sleep and explored patient's current sleep hygiene. Provided psycho education related to healthy sleep hygiene, cognitive distortions, cognitive restructuring, and the use of a thought record. Provided validation and reflective listening. Clinician requested for homework patient complete a thought record.    Collaboration of Care: Other not required at this time   Diagnosis:  Major depressive disorder, recurrent episode, moderate (HCC)     Plan: Patient is to utilize [employer] Therapy, thought re-framing, behavioral activation, and coping strategies to decrease symptoms associated with their diagnosis. Frequency: bi-weekly  Modality: individual      Long-term goal:   Reduce overall level, frequency, and intensity of the feelings of depression as evidenced by decreased crying, difficulty falling asleep and staying asleep, changes in appetite, lack of energy, fatigue, lack of motivation, difficulty focusing, and loss of interest from 7 days/week to 2 to 3 days/week per patient report for at least 3 consecutive months. Target Date: 08/17/23  Progress: progressing    Short-term goal:  Begin a healthy grieving process around the loss of patient's mother Target Date: 2023/03/10  Progress: progressing    Decrease negative thoughts associated with patient's mother's death Target Date: 03/10/23  Progress: progressing    Identify, challenge, and replace negative thought patterns that contribute to feelings of depression with positive thoughts  and beliefs per patient's report Target Date: 02/17/23  Progress:  progressing    Increase activities that patient enjoys to support increased motivation and mood, such as, socializing with friends weekly, completing puzzles, playing games. Target Date: 02/17/23  Progress: progressing                            [name], LCSW

## 2022-10-12 ENCOUNTER — Ambulatory Visit: Payer: Medicare Other | Admitting: Clinical

## 2022-10-13 ENCOUNTER — Other Ambulatory Visit: Payer: Self-pay | Admitting: Nurse Practitioner

## 2022-10-16 NOTE — Telephone Encounter (Signed)
Refill allows 3 month supply- ok to give #90 Requested Prescriptions  Pending Prescriptions Disp Refills   traZODone (DESYREL) 50 MG tablet [Pharmacy Med Name: TRAZODONE 50 MG TABLET] 90 tablet 0    Sig: TAKE 1/2-1 TABLET BY MOUTH AT BEDTIME AS NEEDED FOR SLEEP.     Psychiatry: Antidepressants - Serotonin Modulator Passed - 10/13/2022  9:31 AM      Passed - Completed PHQ-2 or PHQ-9 in the last 360 days      Passed - Valid encounter within last 6 months    Recent Outpatient Visits           3 weeks ago Severe episode of recurrent major depressive disorder, without psychotic features (HCC)   [place] [facility] [place], Jolene T, NP   2 months ago Severe episode of recurrent major depressive disorder, without psychotic features (HCC)   [place] [facility] [place], Jolene T, NP   3 months ago Severe episode of recurrent major depressive disorder, without psychotic features (HCC)   [place] [facility] [place], [place] T, NP   1 year ago Severe episode of recurrent major depressive disorder, without psychotic features (HCC)   [place] [facility] [place], [place] T, NP   1 year ago Severe episode of recurrent major depressive disorder, without psychotic features (HCC)   [place] [facility] [place], Dorie Rank, NP       Future Appointments             In 2 weeks [place], Dorie Rank, NP [facility] [place]

## 2022-10-17 ENCOUNTER — Ambulatory Visit: Payer: Self-pay | Admitting: *Deleted

## 2022-10-17 NOTE — Telephone Encounter (Signed)
Summary: rx concern   The patient is concerned that they may be experiencing potential side effects related to their prescription for traZODone (DESYREL) 50 MG tablet [[id number]]  The patient has noticed trembling on the right side of their body in both their arm and leg  The patient has noticed no numbness, tingling or soreness  Please contact further when possible          Chief Complaint: Tremors Symptoms: Bilateral, arm and leg, right> left. States "Jerks" at times. Frequency: 1-1/2 weeks ago Pertinent Negatives: Patient denies numbness, tingling Disposition: [] ED /[] Urgent Care (no appt availability in office) / [] Appointment(In office/virtual)/ []  [facility]/ [] Home Care/ [] Refused Recommended Disposition /[] [facility]/ [x]  Follow-up with PCP Additional Notes: States started trazodone 09/21/22. Started with 1/2, went up to 1 tab, taking each HS. States she thinks coming from Trazodone. Reports dry mouth as well. Advised appt for eval of tremors, declines states she is going out of town tomorrow. Advised to hold med tonight. Assured pt NT would route to practice for PCPs review. Advised ED for woorsening symptoms. Pt verbalizes undrestanding.  Reason for Disposition  [1] Caller has URGENT medicine question about med that PCP or specialist prescribed AND [2] triager unable to answer question  Answer Assessment - Initial Assessment Questions 1. NAME of MEDICINE: "What medicine(s) are you calling about?"     Trazodone. 2. QUESTION: "What is your question?" (e.g., double dose of medicine, side effect)     Causing tremors? 3. PRESCRIBER: "Who prescribed the medicine?" Reason: if prescribed by specialist, call should be referred to that group.     PCP  Protocols used: Medication Question Call-A-AH

## 2022-10-18 NOTE — Telephone Encounter (Signed)
Called and notified patient of Jolene's message.

## 2022-10-26 ENCOUNTER — Ambulatory Visit (INDEPENDENT_AMBULATORY_CARE_PROVIDER_SITE_OTHER): Payer: Medicare Other | Admitting: Clinical

## 2022-10-26 DIAGNOSIS — F331 Major depressive disorder, recurrent, moderate: Secondary | ICD-10-CM | POA: Diagnosis not present

## 2022-10-26 NOTE — Progress Notes (Signed)
                Dezi Schaner, LCSW 

## 2022-10-26 NOTE — Progress Notes (Signed)
[facility] Counselor/Therapist Progress Note  Patient ID: Marie Perry, MRN: [id number],    Date: 10/26/2022  Time Spent: 10:36am - 11:25am : 49 minutes  Treatment Type: Individual Therapy  Reported Symptoms: Patient reported worry and depressed mood  Mental Status Exam: Appearance:  Neat and Well Groomed     Behavior: Appropriate  Motor: Normal  Speech/Language:  Clear and Coherent  Affect: Appropriate  Mood: depressed  Thought process: normal  Thought content:   WNL  Sensory/Perceptual disturbances:   WNL  Orientation: oriented to person, place, and situation  Attention: Good  Concentration: Good  Memory: WNL  Fund of knowledge:  Good  Insight:   Good  Judgment:  Good  Impulse Control: Good   Risk Assessment: Danger to Self:  No Patient denied current suicidal ideation  Self-injurious Behavior: No Danger to Others: No Patient denied current homicidal ideation Duty to Warn:no Physical Aggression / Violence:No  Access to Firearms a concern: No  Gang Involvement:No   Subjective: Patient stated, "I've been busy" , "I've been involved in more stuff", and reported she recently went on a trip to visit family.  Patient reported she recently visited her brother and sister in law and had an opportunity to spend a day to herself relaxing.  Patient stated, "it was a nice visit". Patient reported due to patient's mother's history of Dementia patient is worried that she will develop Dementia . Patient reported the first symptom of dementia patient's mother exhibited was paranoia. During today's session patient inquired about Dementia and the risk of Dementia. Patient reported patient's mother is the only individual in the family that has received a diagnosis of dementia. Patient reported there was a day recently in which patient was shaking and felt "really not with it". Patient reported she was concerned and spoke with her physician. Patient reported her physician  advised patient to stop taking a new medication physician recently prescribed and patient reported she has a follow up appointment with her PCP. Patient stated, "its just worried me" in response to recent symptoms. Patient stated, "I'm not feeling really cheery", "I have that seasonal depression too" in response to patient's current mood.   Interventions: Cognitive Behavioral Therapy. Clinician conducted session via caregility video from clinician's home office. Patient provided verbal consent to proceed with telehealth session and is aware of limitations of telephone or video visits. Patient participated in session from patient's home. Reviewed events since last session. Discussed patient's recent visit with her brother and sister in law and patient's response to the recent visit. Provided supportive therapy, active listening, and validation as patient discussed concerns related to family history of Dementia. Provided psycho education related to dementia and risk factors. Discussed patient initiating a conversation with patient's PCP to discuss patient's concerns. Assessed patient's mood since last session and patient's current mood. Clinician requested for homework patient continue thought record.    Collaboration of Care: Other not required at this time   Diagnosis:  Major depressive disorder, recurrent episode, moderate (HCC)     Plan: Patient is to utilize [employer] Therapy, thought re-framing, behavioral activation, and coping strategies to decrease symptoms associated with their diagnosis. Frequency: bi-weekly  Modality: individual      Long-term goal:   Reduce overall level, frequency, and intensity of the feelings of depression as evidenced by decreased crying, difficulty falling asleep and staying asleep, changes in appetite, lack of energy, fatigue, lack of motivation, difficulty focusing, and loss of interest from 7 days/week to 2 to  3 days/week per patient report for at least 3  consecutive months. Target Date: 08/17/23  Progress: progressing    Short-term goal:  Begin a healthy grieving process around the loss of patient's mother Target Date: February 23, 2023  Progress: progressing    Decrease negative thoughts associated with patient's mother's death Target Date: 02/23/23  Progress: progressing    Identify, challenge, and replace negative thought patterns that contribute to feelings of depression with positive thoughts and beliefs per patient's report Target Date: 02/23/2023  Progress: progressing    Increase activities that patient enjoys to support increased motivation and mood, such as, socializing with friends weekly, completing puzzles, playing games. Target Date: 23-Feb-2023  Progress: progressing                    [name], LCSW

## 2022-10-28 NOTE — Patient Instructions (Addendum)
Scheduled both mammogram and bone density same day in December.  Be Involved in Caring For Your Health:  Taking Medications When medications are taken as directed, they can greatly improve your health. But if they are not taken as prescribed, they may not work. In some cases, not taking them correctly can be harmful. To help ensure your treatment remains effective and safe, understand your medications and how to take them. Bring your medications to each visit for review by your provider.  Your lab results, notes, and after visit summary will be available on My Chart. We strongly encourage you to use this feature. If lab results are abnormal the clinic will contact you with the appropriate steps. If the clinic does not contact you assume the results are satisfactory. You can always view your results on My Chart. If you have questions regarding your health or results, please contact the clinic during office hours. You can also ask questions on My Chart.  We at [facility] are grateful that you chose [place] to provide your care. We strive to provide evidence-based and compassionate care and are always looking for feedback. If you get a survey from the clinic please complete this so we can hear your opinions.  Managing Depression, Adult Depression is a mental health condition that affects your thoughts, feelings, and actions. Being diagnosed with depression can bring you relief if you did not know why you have felt or behaved a certain way. It could also leave you feeling overwhelmed. Finding ways to manage your symptoms can help you feel more positive about your future. How to manage lifestyle changes Being depressed is difficult. Depression can increase the level of everyday stress. Stress can make depression symptoms worse. You may believe your symptoms cannot be managed or will never improve. However, there are many things you can try to help manage your symptoms. There is hope. Managing  stress  Stress is your body's reaction to life changes and events, both good and bad. Stress can add to your feelings of depression. Learning to manage your stress can help lessen your feelings of depression. Try some of the following approaches to reducing your stress (stress reduction techniques): Listen to music that you enjoy and that inspires you. Try using a meditation app or take a meditation class. Develop a practice that helps you connect with your spiritual self. Walk in nature, pray, or go to a place of worship. Practice deep breathing. To do this, inhale slowly through your nose. Pause at the top of your inhale for a few seconds and then exhale slowly, letting yourself relax. Repeat this three or four times. Practice yoga to help relax and work your muscles. Choose a stress reduction technique that works for you. These techniques take time and practice to develop. Set aside 5-15 minutes a day to do them. Therapists can offer training in these techniques. Do these things to help manage stress: Keep a journal. Know your limits. Set healthy boundaries for yourself and others, such as saying "no" when you think something is too much. Pay attention to how you react to certain situations. You may not be able to control everything, but you can change your reaction. Add humor to your life by watching funny movies or shows. Make time for activities that you enjoy and that relax you. Spend less time using electronics, especially at night before bed. The light from screens can make your brain think it is time to get up rather than go to bed.  Medicines Medicines, such as antidepressants, are often a part of treatment for depression. Talk with your pharmacist or health care provider about all the medicines, supplements, and herbal products that you take, their possible side effects, and what medicines and other products are safe to take together. Make sure to report any side effects you may have  to your health care provider. Relationships Your health care provider may suggest family therapy, couples therapy, or individual therapy as part of your treatment. How to recognize changes Everyone responds differently to treatment for depression. As you recover from depression, you may start to: Have more interest in doing activities. Feel more hopeful. Have more energy. Eat a more regular amount of food. Have better mental focus. It is important to recognize if your depression is not getting better or is getting worse. The symptoms you had in the beginning may return, such as: Feeling tired. Eating too much or too little. Sleeping too much or too little. Feeling restless, agitated, or hopeless. Trouble focusing or making decisions. Having unexplained aches and pains. Feeling irritable, angry, or aggressive. If you or your family members notice these symptoms coming back, let your health care provider know right away. Follow these instructions at home: Activity Try to get some form of exercise each day, such as walking. Try yoga, mindfulness, or other stress reduction techniques. Participate in group activities if you are able. Lifestyle Get enough sleep. Cut down on or stop using caffeine, tobacco, alcohol, and any other harmful substances. Eat a healthy diet that includes plenty of vegetables, fruits, whole grains, low-fat dairy products, and lean protein. Limit foods that are high in solid fats, added sugar, or salt (sodium). General instructions Take over-the-counter and prescription medicines only as told by your health care provider. Keep all follow-up visits. It is important for your health care provider to check on your mood, behavior, and medicines. Your health care provider may need to make changes to your treatment. Where to find support Talking to others  Friends and family members can be sources of support and guidance. Talk to trusted friends or family members about  your condition. Explain your symptoms and let them know that you are working with a health care provider to treat your depression. Tell friends and family how they can help. Finances Find mental health providers that fit with your financial situation. Talk with your health care provider if you are worried about access to food, housing, or medicine. Call your insurance company to learn about your co-pays and prescription plan. Where to find more information You can find support in your area from: Anxiety and Depression Association of America (ADAA): adaa.org Mental Health America: mentalhealthamerica.net The [employer] on Mental Illness: nami.org Contact a health care provider if: You stop taking your antidepressant medicines, and you have any of these symptoms: Nausea. Headache. Light-headedness. Chills and body aches. Not being able to sleep (insomnia). You or your friends and family think your depression is getting worse. Get help right away if: You have thoughts of hurting yourself or others. Get help right away if you feel like you may hurt yourself or others, or have thoughts about taking your own life. Go to your nearest emergency room or: Call 911. Call the National Suicide Prevention Lifeline at 470-165-5266 or 988. This is open 24 hours a day. Text the Crisis Text Line at 806 004 7664. This information is not intended to replace advice given to you by your health care provider. Make sure you discuss any questions you have  with your health care provider. Document Revised: 06/06/2021 Document Reviewed: 06/06/2021 Elsevier Patient Education  2024 [employer].

## 2022-11-02 ENCOUNTER — Ambulatory Visit (INDEPENDENT_AMBULATORY_CARE_PROVIDER_SITE_OTHER): Payer: Medicare Other | Admitting: Nurse Practitioner

## 2022-11-02 ENCOUNTER — Encounter: Payer: Self-pay | Admitting: Nurse Practitioner

## 2022-11-02 VITALS — BP 121/65 | HR 87 | Temp 98.4°F | Ht 66.5 in | Wt 196.4 lb

## 2022-11-02 DIAGNOSIS — F4321 Adjustment disorder with depressed mood: Secondary | ICD-10-CM

## 2022-11-02 DIAGNOSIS — Z23 Encounter for immunization: Secondary | ICD-10-CM

## 2022-11-02 DIAGNOSIS — F332 Major depressive disorder, recurrent severe without psychotic features: Secondary | ICD-10-CM

## 2022-11-02 DIAGNOSIS — F5104 Psychophysiologic insomnia: Secondary | ICD-10-CM

## 2022-11-02 NOTE — Progress Notes (Signed)
BP 121/65   Pulse 87   Temp 98.4 F (36.9 C) (Oral)   Ht 5' 6.5" (1.689 m)   Wt 196 lb 6.4 oz (89.1 kg)   LMP  (LMP Unknown)   SpO2 97%   BMI 31.22 kg/m    Subjective:    Patient ID: Marie Perry, female    DOB: 1950/10/26, 72 y.o.   MRN: [id number]  HPI: Marie Perry is a 72 y.o. female  Chief Complaint  Patient presents with   Depression   Insomnia    Patient states she thinks the Trazodone was making her have tremors.    DEPRESSION Follow-up today for mood.  Currently taking Wellbutrin, Buspar, and Cymbalta which offer benefit, occasional sweats from SNRI.  Added Trazodone for sleep last visit, but she reports this caused tremors and she stopped taking -- is taking Melatonin as needed.  Loss of mother, who she lived with and care for, months back.  Continues to visit with therapy, last visit 10/26/22. Mood status: stable Satisfied with current treatment?: yes Symptom severity: moderate  Duration of current treatment : chronic Side effects: as above with Trazodone Medication compliance: good compliance Psychotherapy/counseling: yes current Depressed mood: improving Anxious mood: no Anhedonia: no Significant weight loss or gain: no Insomnia: yes hard to fall asleep at times, although improving Fatigue: no Feelings of worthlessness or guilt: occasionally if does not get things done Impaired concentration/indecisiveness: no Suicidal ideations: no Hopelessness: no Crying spells: yes    11/02/2022   10:55 AM 09/21/2022   10:13 AM 08/02/2022   10:29 AM 06/21/2022   10:44 AM 04/10/2022   11:36 AM  Depression screen PHQ 2/9  Decreased Interest 1 1 2 3    Down, Depressed, Hopeless 1 1 2 3 1   PHQ - 2 Score 2 2 4 6 1   Altered sleeping 1 2 3 3 1   Tired, decreased energy 2 2 2 2 1   Change in appetite 1 2 1 2    Feeling bad or failure about yourself  1 1 1 1    Trouble concentrating 1 1 1 1    Moving slowly or fidgety/restless 0 0 0 1   Suicidal thoughts 0 0 0 1    PHQ-9 Score 8 10 12 17 3   Difficult doing work/chores Somewhat difficult Somewhat difficult Somewhat difficult Very difficult        11/02/2022   10:55 AM 09/21/2022   10:13 AM 08/02/2022   10:30 AM 06/21/2022   10:44 AM  GAD 7 : Generalized Anxiety Score  Nervous, Anxious, on Edge 1 0 1 2  Control/stop worrying 1 1 2 3   Worry too much - different things 1 1 1 2   Trouble relaxing 0 1 0 2  Restless 0 0 0 1  Easily annoyed or irritable 0 0 0 0  Afraid - awful might happen 0 0 0 1  Total GAD 7 Score 3 3 4 11   Anxiety Difficulty Not difficult at all Not difficult at all Not difficult at all Somewhat difficult   Relevant past medical, surgical, family and social history reviewed and updated as indicated. Interim medical history since our last visit reviewed. Allergies and medications reviewed and updated.  Review of Systems  Constitutional:  Negative for activity change, appetite change, diaphoresis, fatigue and fever.  Respiratory:  Negative for cough, chest tightness and shortness of breath.   Cardiovascular:  Negative for chest pain, palpitations and leg swelling.  Gastrointestinal: Negative.   Neurological: Negative.   Psychiatric/Behavioral:  Positive for decreased concentration and sleep disturbance. Negative for self-injury and suicidal ideas. The patient is nervous/anxious.     Per HPI unless specifically indicated above     Objective:    BP 121/65   Pulse 87   Temp 98.4 F (36.9 C) (Oral)   Ht 5' 6.5" (1.689 m)   Wt 196 lb 6.4 oz (89.1 kg)   LMP  (LMP Unknown)   SpO2 97%   BMI 31.22 kg/m   Wt Readings from Last 3 Encounters:  11/02/22 196 lb 6.4 oz (89.1 kg)  09/21/22 197 lb 3.2 oz (89.4 kg)  08/02/22 195 lb 6.4 oz (88.6 kg)    Physical Exam Vitals and nursing note reviewed.  Constitutional:      General: She is awake. She is not in acute distress.    Appearance: She is well-developed and well-groomed. She is obese. She is not ill-appearing or toxic-appearing.   HENT:     Head: Normocephalic.     Right Ear: Hearing and external ear normal.     Left Ear: Hearing and external ear normal.  Eyes:     General: Lids are normal.        Right eye: No discharge.        Left eye: No discharge.     Conjunctiva/sclera: Conjunctivae normal.     Pupils: Pupils are equal, round, and reactive to light.  Neck:     Thyroid: No thyromegaly.     Vascular: No carotid bruit.  Cardiovascular:     Rate and Rhythm: Normal rate and regular rhythm.     Heart sounds: Normal heart sounds. No murmur heard.    No gallop.  Pulmonary:     Effort: Pulmonary effort is normal. No accessory muscle usage or respiratory distress.     Breath sounds: Normal breath sounds.  Abdominal:     General: Bowel sounds are normal. There is no distension.     Palpations: Abdomen is soft.     Tenderness: There is no abdominal tenderness.  Musculoskeletal:     Cervical back: Normal range of motion and neck supple.     Right lower leg: No edema.     Left lower leg: No edema.  Lymphadenopathy:     Cervical: No cervical adenopathy.  Skin:    General: Skin is warm and dry.  Neurological:     Mental Status: She is alert and oriented to person, place, and time.     Deep Tendon Reflexes: Reflexes are normal and symmetric.     Reflex Scores:      Brachioradialis reflexes are 2+ on the right side and 2+ on the left side.      Patellar reflexes are 2+ on the right side and 2+ on the left side. Psychiatric:        Attention and Perception: Attention normal.        Mood and Affect: Mood normal.        Speech: Speech normal.        Behavior: Behavior normal. Behavior is cooperative.        Thought Content: Thought content normal.    Results for orders placed or performed in visit on 06/21/22  CBC with Differential/Platelet  Result Value Ref Range   WBC 7.6 3.4 - 10.8 x10E3/uL   RBC 5.10 3.77 - 5.28 x10E6/uL   Hemoglobin 15.1 11.1 - 15.9 g/dL   Hematocrit 16.1 09.6 - 46.6 %   MCV 91 79  - 97 fL  MCH 29.6 26.6 - 33.0 pg   MCHC 32.5 31.5 - 35.7 g/dL   RDW 29.5 18.8 - 41.6 %   Platelets 214 150 - 450 x10E3/uL   Neutrophils 64 Not Estab. %   Lymphs 27 Not Estab. %   Monocytes 7 Not Estab. %   Eos 2 Not Estab. %   Basos 0 Not Estab. %   Neutrophils Absolute 4.9 1.4 - 7.0 x10E3/uL   Lymphocytes Absolute 2.1 0.7 - 3.1 x10E3/uL   Monocytes Absolute 0.5 0.1 - 0.9 x10E3/uL   EOS (ABSOLUTE) 0.1 0.0 - 0.4 x10E3/uL   Basophils Absolute 0.0 0.0 - 0.2 x10E3/uL   Immature Granulocytes 0 Not Estab. %   Immature Grans (Abs) 0.0 0.0 - 0.1 x10E3/uL  Comprehensive metabolic panel  Result Value Ref Range   Glucose 107 (H) 70 - 99 mg/dL   BUN 21 8 - 27 mg/dL   Creatinine, Ser 6.06 0.57 - 1.00 mg/dL   eGFR 72 >30 ZS/WFU/9.32   BUN/Creatinine Ratio 24 12 - 28   Sodium 140 134 - 144 mmol/L   Potassium 4.1 3.5 - 5.2 mmol/L   Chloride 102 96 - 106 mmol/L   CO2 25 20 - 29 mmol/L   Calcium 10.1 8.7 - 10.3 mg/dL   Total Protein 7.1 6.0 - 8.5 g/dL   Albumin 4.7 3.8 - 4.8 g/dL   Globulin, Total 2.4 1.5 - 4.5 g/dL   Albumin/Globulin Ratio 2.0 1.2 - 2.2   Bilirubin Total 0.6 0.0 - 1.2 mg/dL   Alkaline Phosphatase 83 44 - 121 IU/L   AST 19 0 - 40 IU/L   ALT 17 0 - 32 IU/L  Lipid Panel w/o Chol/HDL Ratio  Result Value Ref Range   Cholesterol, Total 182 100 - 199 mg/dL   Triglycerides 355 (H) 0 - 149 mg/dL   HDL 59 >73 mg/dL   VLDL Cholesterol Cal 33 5 - 40 mg/dL   LDL Chol Calc (NIH) 90 0 - 99 mg/dL  TSH  Result Value Ref Range   TSH 1.940 0.450 - 4.500 uIU/mL  VITAMIN D 25 Hydroxy (Vit-D Deficiency, Fractures)  Result Value Ref Range   Vit D, 25-Hydroxy 42.6 30.0 - 100.0 ng/mL  Vitamin B12  Result Value Ref Range   Vitamin B-12 >2000 (H) 232 - 1245 pg/mL      Assessment & Plan:   Problem List Items Addressed This Visit       Other   Grief reaction    Refer to depression plan of care.      Insomnia    Chronic, ongoing.  Stop Trazodone due to side effects and continues  Melatonin as needed + therapy sessions.        Recurrent major depression-severe (HCC) - Primary    Chronic, stable.  Recent loss of mother 7 months ago whom she lived with for 30 years.  Denies SI/HI.  Will continue current medication regimen at this time and adjust as needed,Wellbutrin offering benefit to overall mood.  She does gets some sweats with SNRI, but overall tolerating regimen.  Stop Trazodone and continue Melatonin.  Continue therapy, which is offering benefit.  Does get seasonal symptoms, which concerns her.  Recommend she obtain a Happy Light and use this during the fall and winter months daily.  Educated her on medication and use + side effects.        Other Visit Diagnoses     Flu vaccine need       Flu vaccine in  office today, educated patient.   Relevant Orders   Flu Vaccine Trivalent High Dose (Fluad) (Completed)        Follow up plan: Return in about 7 weeks (around 12/24/2022) for MOOD, HLD, OSTEOPOROSIS.

## 2022-11-02 NOTE — Assessment & Plan Note (Signed)
Chronic, ongoing.  Stop Trazodone due to side effects and continues Melatonin as needed + therapy sessions.

## 2022-11-02 NOTE — Assessment & Plan Note (Signed)
Refer to depression plan of care. 

## 2022-11-02 NOTE — Assessment & Plan Note (Signed)
Chronic, stable.  Recent loss of mother 7 months ago whom she lived with for 30 years.  Denies SI/HI.  Will continue current medication regimen at this time and adjust as needed,Wellbutrin offering benefit to overall mood.  She does gets some sweats with SNRI, but overall tolerating regimen.  Stop Trazodone and continue Melatonin.  Continue therapy, which is offering benefit.  Does get seasonal symptoms, which concerns her.  Recommend she obtain a Happy Light and use this during the fall and winter months daily.  Educated her on medication and use + side effects.

## 2022-11-05 DIAGNOSIS — Z23 Encounter for immunization: Secondary | ICD-10-CM | POA: Diagnosis not present

## 2022-11-09 ENCOUNTER — Ambulatory Visit (INDEPENDENT_AMBULATORY_CARE_PROVIDER_SITE_OTHER): Payer: Medicare Other | Admitting: Clinical

## 2022-11-09 DIAGNOSIS — F331 Major depressive disorder, recurrent, moderate: Secondary | ICD-10-CM | POA: Diagnosis not present

## 2022-11-09 NOTE — Progress Notes (Signed)
                Dezi Schaner, LCSW 

## 2022-11-09 NOTE — Progress Notes (Signed)
[facility] Counselor/Therapist Progress Note  Patient ID: Marie Perry, MRN: [id number],    Date: 11/09/2022  Time Spent: 9:35am - 10:21am : 46 minutes   Treatment Type: Individual Therapy  Reported Symptoms: Patient reported anxiety in response to upcoming trip  Mental Status Exam: Appearance:  Neat and Well Groomed     Behavior: Appropriate  Motor: Normal  Speech/Language:  Clear and Coherent  Affect: Appropriate  Mood: normal  Thought process: normal  Thought content:   WNL  Sensory/Perceptual disturbances:   WNL  Orientation: oriented to person, place, and situation  Attention: Good  Concentration: Good  Memory: WNL  Fund of knowledge:  Good  Insight:   Good  Judgment:  Good  Impulse Control: Good   Risk Assessment: Danger to Self:  No Patient denied current suicidal ideation  Self-injurious Behavior: No Danger to Others: No Patient denied current homicidal ideation Duty to Warn:no Physical Aggression / Violence:No  Access to Firearms a concern: No  Gang Involvement:No   Subjective: Patient stated, "ok" in response to events since last session. Patient reported she has experienced positive dreams of her mother since last session  Patient reported she has had to make several repairs to patient's heating/air until and is evaluating a financial decision regarding potentially replacing the unit. Patient reported she has been experiencing some anxiety in response to an upcoming trip in October. Patient reported she will be flying for the second time and is uncertain about the airline requirements. Patient stated, "I see something good coming up and I think what is going to go wrong". Patient reported she is concerned about her brother and sister in law becoming fatigued during the trip and remaining in bed. 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 Patient reported she has been utilizing light therapy to improve  her mood in response to seasonal changes. Patient stated, "its kind of in the middle" in response to patient's current mood. Patient stated, "I have not written down a lot" in response to patient's thought record.  Patient reported she plans to keep her journal in close proximity to increase documentation in thought record.   Interventions: Cognitive Behavioral Therapy. Clinician conducted session via caregility video from clinician's home office. Patient provided verbal consent to proceed with telehealth session and is aware of limitations of telephone or video visits. Patient participated in session from patient's home. Reviewed events since last session. Discussed current financial stressors and patient's response. Assisted patient in discussing and identifying thoughts/feelings triggered by upcoming trip. Provided psycho education related to cognitive distortions, such as, catastrophizing, and cognitive restructuring. Assessed patient's mood since last session and assessed patient's current mood. Reviewed patient's thought record and explored barriers to patient completing thought record. Provided psycho education related to the use of a thought record. Clinician requested for homework patient continue thought record.     Collaboration of Care: Other not required at this time   Diagnosis:  Major depressive disorder, recurrent episode, moderate (HCC)     Plan: Patient is to utilize [employer] Therapy, thought re-framing, behavioral activation, and coping strategies to decrease symptoms associated with their diagnosis. Frequency: bi-weekly  Modality: individual      Long-term goal:   Reduce overall level, frequency, and intensity of the feelings of depression as evidenced by decreased crying, difficulty falling asleep and staying asleep, changes in appetite, lack of energy, fatigue, lack of motivation, difficulty focusing, and loss of interest from 7 days/week to 2  to 3 days/week per  patient report for at least 3 consecutive months. Target Date: 08/17/23  Progress: progressing    Short-term goal:  Begin a healthy grieving process around the loss of patient's mother Target Date: 03/07/23  Progress: progressing    Decrease negative thoughts associated with patient's mother's death Target Date: 03/07/23  Progress: progressing    Identify, challenge, and replace negative thought patterns that contribute to feelings of depression with positive thoughts and beliefs per patient's report Target Date: 03-07-2023  Progress: progressing    Increase activities that patient enjoys to support increased motivation and mood, such as, socializing with friends weekly, completing puzzles, playing games. Target Date: 03/07/23  Progress: progressing                  [name], LCSW

## 2022-11-19 ENCOUNTER — Ambulatory Visit (INDEPENDENT_AMBULATORY_CARE_PROVIDER_SITE_OTHER): Payer: Medicare Other | Admitting: Clinical

## 2022-11-19 DIAGNOSIS — F331 Major depressive disorder, recurrent, moderate: Secondary | ICD-10-CM | POA: Diagnosis not present

## 2022-11-19 NOTE — Progress Notes (Signed)
                Dezi Schaner, LCSW 
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
## 2022-12-07 ENCOUNTER — Ambulatory Visit (INDEPENDENT_AMBULATORY_CARE_PROVIDER_SITE_OTHER): Payer: Medicare Other | Admitting: Clinical

## 2022-12-07 DIAGNOSIS — F331 Major depressive disorder, recurrent, moderate: Secondary | ICD-10-CM
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
## 2022-12-07 NOTE — Progress Notes (Signed)
                Dezi Schaner, LCSW 

## 2022-12-21 ENCOUNTER — Ambulatory Visit (INDEPENDENT_AMBULATORY_CARE_PROVIDER_SITE_OTHER): Payer: Medicare Other | Admitting: Clinical

## 2022-12-21 ENCOUNTER — Ambulatory Visit: Payer: Medicare Other | Admitting: Clinical

## 2022-12-21 DIAGNOSIS — F331 Major depressive disorder, recurrent, moderate: Secondary | ICD-10-CM | POA: Diagnosis not present

## 2022-12-21 NOTE — Progress Notes (Signed)
[facility] Counselor/Therapist Progress Note  Patient ID: Marie Perry, MRN: [id number],    Date: 12/21/2022  Time Spent: 10:31am - 11:25am : 54 minutes  Treatment Type: Individual Therapy  Reported Symptoms: Patient reported experiencing periods of sadness and anxiety since last session.   Mental Status Exam: Appearance:  Neat and Well Groomed     Behavior: Appropriate  Motor: Normal  Speech/Language:  Clear and Coherent  Affect: Appropriate  Mood: normal  Thought process: normal  Thought content:   WNL  Sensory/Perceptual disturbances:   WNL  Orientation: oriented to person, place, and situation  Attention: Good  Concentration: Good  Memory: WNL  Fund of knowledge:  Good  Insight:   Good  Judgment:  Good  Impulse Control: Good   Risk Assessment: Danger to Self:  No Patient denied current suicidal ideation  Self-injurious Behavior: No Danger to Others: No Patient denied current homicidal ideation Duty to Warn:no Physical Aggression / Violence:No  Access to Firearms a concern: No  Gang Involvement:No   Subjective: Patient stated, "I'm still grappling with emotional breakouts every once in a while".  Patient reported television shows that include a person with dementia are triggers for sadness and anxiety. Patient stated, "makes me anxious and cry" in response to television shows. Patient stated, "it seems like I'm going back and reviewing when I noticed stages", "I keep thinking about how far back does this go" in reference to symptoms of dementia.  Patient stated, "we just wonder how far back had she been having these problems". Patient reported her mother was diagnosed with depression prior being diagnosed with dementia. Patient reported her mother had a history of believing people were following her and believing people were walking around outside of her home. Patient reported patient's mother, patient, and patient's siblings were isolated when  patient was a child. During today's session, patient shared examples of changes in her mother's behaviors that patient feels may have been attributed to dementia. Patient reported "moments" of anxiety since last session.  Patient stated, "I feel like I'm still trying to work that out with me working in the middle of the week" in reference to completing household tasks. Patient reported she has set a timer for 10 minutes and tried accomplished what she can in 10 minutes. Patient reported she found use of the timer helpful in completing tasks.  Interventions: Cognitive Behavioral Therapy and supportive therapy.  Clinician conducted session via caregility video from clinician's home office. Patient provided verbal consent to proceed with telehealth session and is aware of limitations of telephone or video visits. Patient participated in session from patient's home. Reviewed events since last session. Assisted patient in exploring and identifying triggers for feelings of sadness and anxiety. Provided supportive therapy, active listening, and validation as patient discussed patient's mother's experience and progression as it relates to dementia. Provided psycho education related to symptoms of dementia and symptoms of depression. Assessed patient's mood since last session. Clinician requested for homework patient continue thought record.     Collaboration of Care: Other not required at this time   Diagnosis:  Major depressive disorder, recurrent episode, moderate (HCC)     Plan: Patient is to utilize [employer] Therapy, thought re-framing, behavioral activation, and coping strategies to decrease symptoms associated with their diagnosis. Frequency: bi-weekly  Modality: individual      Long-term goal:   Reduce overall level, frequency, and intensity of the feelings of depression as evidenced by decreased crying, difficulty falling asleep and staying asleep,  changes in appetite, lack of energy,  fatigue, lack of motivation, difficulty focusing, and loss of interest from 7 days/week to 2 to 3 days/week per patient report for at least 3 consecutive months. Target Date: 08/17/23  Progress: progressing    Short-term goal:  Begin a healthy grieving process around the loss of patient's mother Target Date: 2023-03-12  Progress: progressing    Decrease negative thoughts associated with patient's mother's death Target Date: 03/12/2023  Progress: progressing    Identify, challenge, and replace negative thought patterns that contribute to feelings of depression with positive thoughts and beliefs per patient's report Target Date: 2023/03/12  Progress: progressing    Increase activities that patient enjoys to support increased motivation and mood, such as, socializing with friends weekly, completing puzzles, playing games. Target Date: 12-Mar-2023  Progress: progressing     [name], LCSW

## 2022-12-21 NOTE — Progress Notes (Signed)
                Dezi Schaner, LCSW 

## 2022-12-23 NOTE — Patient Instructions (Signed)
Managing Depression, Adult Depression is a mental health condition that affects your thoughts, feelings, and actions. Being diagnosed with depression can bring you relief if you did not know why you have felt or behaved a certain way. It could also leave you feeling overwhelmed. Finding ways to manage your symptoms can help you feel more positive about your future. How to manage lifestyle changes Being depressed is difficult. Depression can increase the level of everyday stress. Stress can make depression symptoms worse. You may believe your symptoms cannot be managed or will never improve. However, there are many things you can try to help manage your symptoms. There is hope. Managing stress  Stress is your body's reaction to life changes and events, both good and bad. Stress can add to your feelings of depression. Learning to manage your stress can help lessen your feelings of depression. Try some of the following approaches to reducing your stress (stress reduction techniques): Listen to music that you enjoy and that inspires you. Try using a meditation app or take a meditation class. Develop a practice that helps you connect with your spiritual self. Walk in nature, pray, or go to a place of worship. Practice deep breathing. To do this, inhale slowly through your nose. Pause at the top of your inhale for a few seconds and then exhale slowly, letting yourself relax. Repeat this three or four times. Practice yoga to help relax and work your muscles. Choose a stress reduction technique that works for you. These techniques take time and practice to develop. Set aside 5-15 minutes a day to do them. Therapists can offer training in these techniques. Do these things to help manage stress: Keep a journal. Know your limits. Set healthy boundaries for yourself and others, such as saying "no" when you think something is too much. Pay attention to how you react to certain situations. You may not be able to  control everything, but you can change your reaction. Add humor to your life by watching funny movies or shows. Make time for activities that you enjoy and that relax you. Spend less time using electronics, especially at night before bed. The light from screens can make your brain think it is time to get up rather than go to bed.  Medicines Medicines, such as antidepressants, are often a part of treatment for depression. Talk with your pharmacist or health care provider about all the medicines, supplements, and herbal products that you take, their possible side effects, and what medicines and other products are safe to take together. Make sure to report any side effects you may have to your health care provider. Relationships Your health care provider may suggest family therapy, couples therapy, or individual therapy as part of your treatment. How to recognize changes Everyone responds differently to treatment for depression. As you recover from depression, you may start to: Have more interest in doing activities. Feel more hopeful. Have more energy. Eat a more regular amount of food. Have better mental focus. It is important to recognize if your depression is not getting better or is getting worse. The symptoms you had in the beginning may return, such as: Feeling tired. Eating too much or too little. Sleeping too much or too little. Feeling restless, agitated, or hopeless. Trouble focusing or making decisions. Having unexplained aches and pains. Feeling irritable, angry, or aggressive. If you or your family members notice these symptoms coming back, let your health care provider know right away. Follow these instructions at home: Activity Try to   get some form of exercise each day, such as walking. Try yoga, mindfulness, or other stress reduction techniques. Participate in group activities if you are able. Lifestyle Get enough sleep. Cut down on or stop using caffeine, tobacco,  alcohol, and any other harmful substances. Eat a healthy diet that includes plenty of vegetables, fruits, whole grains, low-fat dairy products, and lean protein. Limit foods that are high in solid fats, added sugar, or salt (sodium). General instructions Take over-the-counter and prescription medicines only as told by your health care provider. Keep all follow-up visits. It is important for your health care provider to check on your mood, behavior, and medicines. Your health care provider may need to make changes to your treatment. Where to find support Talking to others  Friends and family members can be sources of support and guidance. Talk to trusted friends or family members about your condition. Explain your symptoms and let them know that you are working with a health care provider to treat your depression. Tell friends and family how they can help. Finances Find mental health providers that fit with your financial situation. Talk with your health care provider if you are worried about access to food, housing, or medicine. Call your insurance company to learn about your co-pays and prescription plan. Where to find more information You can find support in your area from: Anxiety and Depression Association of America (ADAA): adaa.org Mental Health America: mentalhealthamerica.net National Alliance on Mental Illness: nami.org Contact a health care provider if: You stop taking your antidepressant medicines, and you have any of these symptoms: Nausea. Headache. Light-headedness. Chills and body aches. Not being able to sleep (insomnia). You or your friends and family think your depression is getting worse. Get help right away if: You have thoughts of hurting yourself or others. Get help right away if you feel like you may hurt yourself or others, or have thoughts about taking your own life. Go to your nearest emergency room or: Call 911. Call the National Suicide Prevention Lifeline at  1-800-273-8255 or 988. This is open 24 hours a day. Text the Crisis Text Line at 741741. This information is not intended to replace advice given to you by your health care provider. Make sure you discuss any questions you have with your health care provider. Document Revised: 06/06/2021 Document Reviewed: 06/06/2021 Elsevier Patient Education  2024 Elsevier Inc.  

## 2022-12-26 ENCOUNTER — Other Ambulatory Visit: Payer: Self-pay | Admitting: Nurse Practitioner

## 2022-12-26 ENCOUNTER — Telehealth: Payer: Self-pay | Admitting: Nurse Practitioner

## 2022-12-26 DIAGNOSIS — M810 Age-related osteoporosis without current pathological fracture: Secondary | ICD-10-CM

## 2022-12-26 DIAGNOSIS — Z1231 Encounter for screening mammogram for malignant neoplasm of breast: Secondary | ICD-10-CM

## 2022-12-26 NOTE — Telephone Encounter (Signed)
Copied from CRM [id number]. Topic: General - Other >> Dec 26, 2022  9:37 AM Franchot Heidelberg wrote: Reason for CRM: Pt called reporting that she needs orders for a bone density test. She is going to [facility].

## 2022-12-26 NOTE — Telephone Encounter (Signed)
Patient reached via MyChart to inform her requested orders have been placed by the provider. Advised patient to reach out our office if she has any questions or concerns.

## 2022-12-27 ENCOUNTER — Encounter: Payer: Self-pay | Admitting: Nurse Practitioner

## 2022-12-27 ENCOUNTER — Ambulatory Visit (INDEPENDENT_AMBULATORY_CARE_PROVIDER_SITE_OTHER): Payer: Medicare Other | Admitting: Nurse Practitioner

## 2022-12-27 VITALS — BP 129/78 | HR 97 | Temp 98.1°F | Resp 18 | Ht 66.0 in | Wt 195.4 lb

## 2022-12-27 DIAGNOSIS — H00012 Hordeolum externum right lower eyelid: Secondary | ICD-10-CM

## 2022-12-27 DIAGNOSIS — F332 Major depressive disorder, recurrent severe without psychotic features: Secondary | ICD-10-CM | POA: Diagnosis not present

## 2022-12-27 MED ORDER — ERYTHROMYCIN 5 MG/GM OP OINT: 1.0000 | TOPICAL_OINTMENT | Freq: Every day | OPHTHALMIC | 0 refills | Status: AC

## 2022-12-27 NOTE — Progress Notes (Signed)
BP 129/78 (BP Location: Left Arm, Patient Position: Sitting, Cuff Size: Large)   Pulse 97   Temp 98.1 F (36.7 C) (Oral)   Resp 18   Ht 5\' 6"  (1.676 m)   Wt 195 lb 6.4 oz (88.6 kg)   LMP  (LMP Unknown)   SpO2 100%   BMI 31.54 kg/m    Subjective:    Patient ID: Marie Perry, female    DOB: 1950/03/29, 72 y.o.   MRN: [id number]  HPI: Marie Perry is a 72 y.o. female  Chief Complaint  Patient presents with   Depression   Has a little spot on eye she would like looked at, noticed it last night.    DEPRESSION Follow-up today for depression.  Currently taking Wellbutrin, Buspar, and Cymbalta.  Has sweats with Cymbalta. Loss of mother in January 2024 (dementia), who she lived with and cared for, months back.  Continues to visit with therapy, last visit 12/21/22.  Tried Trazodone in past for sleep, but this caused tremors. She reports her current medication regimen is offering her benefit. Mood status: stable Satisfied with current treatment?: yes Symptom severity: moderate  Duration of current treatment : chronic Side effects: as above with Trazodone Medication compliance: good compliance Psychotherapy/counseling: yes current Depressed mood: occasional Anxious mood: occasional Anhedonia: occasional Significant weight loss or gain: no Insomnia: improving Fatigue: no Feelings of worthlessness or guilt: occasionally Impaired concentration/indecisiveness: no Suicidal ideations: no Hopelessness: no Crying spells: yes    12/27/2022   11:10 AM 11/02/2022   10:55 AM 09/21/2022   10:13 AM 08/02/2022   10:29 AM 06/21/2022   10:44 AM  Depression screen PHQ 2/9  Decreased Interest 1 1 1 2 3   Down, Depressed, Hopeless 1 1 1 2 3   PHQ - 2 Score 2 2 2 4 6   Altered sleeping 1 1 2 3 3   Tired, decreased energy 2 2 2 2 2   Change in appetite 1 1 2 1 2   Feeling bad or failure about yourself  1 1 1 1 1   Trouble concentrating 1 1 1 1 1   Moving slowly or fidgety/restless 1 0 0  0 1  Suicidal thoughts 0 0 0 0 1  PHQ-9 Score 9 8 10 12 17   Difficult doing work/chores Somewhat difficult Somewhat difficult Somewhat difficult Somewhat difficult Very difficult       12/27/2022   11:11 AM 11/02/2022   10:55 AM 09/21/2022   10:13 AM 08/02/2022   10:30 AM  GAD 7 : Generalized Anxiety Score  Nervous, Anxious, on Edge 1 1 0 1  Control/stop worrying 1 1 1 2   Worry too much - different things 0 1 1 1   Trouble relaxing 1 0 1 0  Restless 0 0 0 0  Easily annoyed or irritable 0 0 0 0  Afraid - awful might happen 1 0 0 0  Total GAD 7 Score 4 3 3 4   Anxiety Difficulty Not difficult at all Not difficult at all Not difficult at all Not difficult at all   Relevant past medical, surgical, family and social history reviewed and updated as indicated. Interim medical history since our last visit reviewed. Allergies and medications reviewed and updated.  Review of Systems  Constitutional:  Negative for activity change, appetite change, diaphoresis, fatigue and fever.  Respiratory:  Negative for cough, chest tightness and shortness of breath.   Cardiovascular:  Negative for chest pain, palpitations and leg swelling.  Gastrointestinal: Negative.   Neurological: Negative.  Psychiatric/Behavioral:  Positive for decreased concentration and sleep disturbance. Negative for self-injury and suicidal ideas. The patient is nervous/anxious.     Per HPI unless specifically indicated above     Objective:    BP 129/78 (BP Location: Left Arm, Patient Position: Sitting, Cuff Size: Large)   Pulse 97   Temp 98.1 F (36.7 C) (Oral)   Resp 18   Ht 5\' 6"  (1.676 m)   Wt 195 lb 6.4 oz (88.6 kg)   LMP  (LMP Unknown)   SpO2 100%   BMI 31.54 kg/m   Wt Readings from Last 3 Encounters:  12/27/22 195 lb 6.4 oz (88.6 kg)  11/02/22 196 lb 6.4 oz (89.1 kg)  09/21/22 197 lb 3.2 oz (89.4 kg)    Physical Exam Vitals and nursing note reviewed.  Constitutional:      General: She is awake. She is not  in acute distress.    Appearance: She is well-developed and well-groomed. She is obese. She is not ill-appearing or toxic-appearing.  HENT:     Head: Normocephalic.     Right Ear: Hearing and external ear normal.     Left Ear: Hearing and external ear normal.  Eyes:     General: Lids are normal.        Right eye: Hordeolum (lower lid exterior aspect) present. No discharge.        Left eye: No discharge.     Conjunctiva/sclera: Conjunctivae normal.     Pupils: Pupils are equal, round, and reactive to light.  Neck:     Thyroid: No thyromegaly.     Vascular: No carotid bruit.  Cardiovascular:     Rate and Rhythm: Normal rate and regular rhythm.     Heart sounds: Normal heart sounds. No murmur heard.    No gallop.  Pulmonary:     Effort: Pulmonary effort is normal. No accessory muscle usage or respiratory distress.     Breath sounds: Normal breath sounds.  Abdominal:     General: Bowel sounds are normal. There is no distension.     Palpations: Abdomen is soft.     Tenderness: There is no abdominal tenderness.  Musculoskeletal:     Cervical back: Normal range of motion and neck supple.     Right lower leg: No edema.     Left lower leg: No edema.  Lymphadenopathy:     Cervical: No cervical adenopathy.  Skin:    General: Skin is warm and dry.  Neurological:     Mental Status: She is alert and oriented to person, place, and time.     Deep Tendon Reflexes: Reflexes are normal and symmetric.     Reflex Scores:      Brachioradialis reflexes are 2+ on the right side and 2+ on the left side.      Patellar reflexes are 2+ on the right side and 2+ on the left side. Psychiatric:        Attention and Perception: Attention normal.        Mood and Affect: Mood normal.        Speech: Speech normal.        Behavior: Behavior normal. Behavior is cooperative.        Thought Content: Thought content normal.    Results for orders placed or performed in visit on 06/21/22  CBC with  Differential/Platelet  Result Value Ref Range   WBC 7.6 3.4 - 10.8 x10E3/uL   RBC 5.10 3.77 - 5.28 x10E6/uL   Hemoglobin  15.1 11.1 - 15.9 g/dL   Hematocrit 54.0 98.1 - 46.6 %   MCV 91 79 - 97 fL   MCH 29.6 26.6 - 33.0 pg   MCHC 32.5 31.5 - 35.7 g/dL   RDW 19.1 47.8 - 29.5 %   Platelets 214 150 - 450 x10E3/uL   Neutrophils 64 Not Estab. %   Lymphs 27 Not Estab. %   Monocytes 7 Not Estab. %   Eos 2 Not Estab. %   Basos 0 Not Estab. %   Neutrophils Absolute 4.9 1.4 - 7.0 x10E3/uL   Lymphocytes Absolute 2.1 0.7 - 3.1 x10E3/uL   Monocytes Absolute 0.5 0.1 - 0.9 x10E3/uL   EOS (ABSOLUTE) 0.1 0.0 - 0.4 x10E3/uL   Basophils Absolute 0.0 0.0 - 0.2 x10E3/uL   Immature Granulocytes 0 Not Estab. %   Immature Grans (Abs) 0.0 0.0 - 0.1 x10E3/uL  Comprehensive metabolic panel  Result Value Ref Range   Glucose 107 (H) 70 - 99 mg/dL   BUN 21 8 - 27 mg/dL   Creatinine, Ser 6.21 0.57 - 1.00 mg/dL   eGFR 72 >30 QM/VHQ/4.69   BUN/Creatinine Ratio 24 12 - 28   Sodium 140 134 - 144 mmol/L   Potassium 4.1 3.5 - 5.2 mmol/L   Chloride 102 96 - 106 mmol/L   CO2 25 20 - 29 mmol/L   Calcium 10.1 8.7 - 10.3 mg/dL   Total Protein 7.1 6.0 - 8.5 g/dL   Albumin 4.7 3.8 - 4.8 g/dL   Globulin, Total 2.4 1.5 - 4.5 g/dL   Albumin/Globulin Ratio 2.0 1.2 - 2.2   Bilirubin Total 0.6 0.0 - 1.2 mg/dL   Alkaline Phosphatase 83 44 - 121 IU/L   AST 19 0 - 40 IU/L   ALT 17 0 - 32 IU/L  Lipid Panel w/o Chol/HDL Ratio  Result Value Ref Range   Cholesterol, Total 182 100 - 199 mg/dL   Triglycerides 629 (H) 0 - 149 mg/dL   HDL 59 >52 mg/dL   VLDL Cholesterol Cal 33 5 - 40 mg/dL   LDL Chol Calc (NIH) 90 0 - 99 mg/dL  TSH  Result Value Ref Range   TSH 1.940 0.450 - 4.500 uIU/mL  VITAMIN D 25 Hydroxy (Vit-D Deficiency, Fractures)  Result Value Ref Range   Vit D, 25-Hydroxy 42.6 30.0 - 100.0 ng/mL  Vitamin B12  Result Value Ref Range   Vitamin B-12 >2000 (H) 232 - 1245 pg/mL      Assessment & Plan:   Problem  List Items Addressed This Visit       Other   Recurrent major depression-severe (HCC) - Primary    Chronic, stable.  Loss of mother in January 2024, whom she lived with for 30 years.  Denies SI/HI.  Will continue current medication regimen at this time and adjust as needed,Wellbutrin offering benefit to overall mood.  She does gets some sweats with SNRI, but overall tolerating regimen.  Continue Melatonin.  Continue therapy, which is offering benefit.  Does get seasonal symptoms, which concerns her.  Obtain a Happy Light and use this during the fall and winter months daily.  Educated her on medication and use + side effects. Have recommended if any worsening symptoms she return to office sooner then May. - Have discussed with her if worsening symptoms will plan on slow taper off Cymbalta and starting Rexulti which may benefit her anhedonia      Other Visit Diagnoses     Hordeolum externum of right lower  eyelid       Noted on exam, sent in Erythromycin ointment and recommend she apply warm compresses to area a few times a day.         Follow up plan: Return in about 6 months (around 06/24/2023) for Annual Physical -- after 06/21/23.

## 2022-12-27 NOTE — Assessment & Plan Note (Signed)
Chronic, stable.  Loss of mother in January 2024, whom she lived with for 30 years.  Denies SI/HI.  Will continue current medication regimen at this time and adjust as needed,Wellbutrin offering benefit to overall mood.  She does gets some sweats with SNRI, but overall tolerating regimen.  Continue Melatonin.  Continue therapy, which is offering benefit.  Does get seasonal symptoms, which concerns her.  Obtain a Happy Light and use this during the fall and winter months daily.  Educated her on medication and use + side effects. Have recommended if any worsening symptoms she return to office sooner then May. - Have discussed with her if worsening symptoms will plan on slow taper off Cymbalta and starting Rexulti which may benefit her anhedonia

## 2023-01-04 ENCOUNTER — Ambulatory Visit (INDEPENDENT_AMBULATORY_CARE_PROVIDER_SITE_OTHER): Payer: Medicare Other | Admitting: Clinical

## 2023-01-04 DIAGNOSIS — F331 Major depressive disorder, recurrent, moderate: Secondary | ICD-10-CM

## 2023-01-04 NOTE — Progress Notes (Signed)
                Dezi Schaner, LCSW 
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
## 2023-01-08 ENCOUNTER — Other Ambulatory Visit: Payer: Self-pay | Admitting: Nurse Practitioner

## 2023-01-09 NOTE — Telephone Encounter (Signed)
Dc'd 11/02/22 side effects J. Cannady NP  Requested Prescriptions  Refused Prescriptions Disp Refills   traZODone (DESYREL) 50 MG tablet [Pharmacy Med Name: TRAZODONE 50 MG TABLET] 90 tablet 0    Sig: TAKE 1/2 TO 1 TABLET BY MOUTH AT BEDTIME AS NEEDED FOR SLEEP     Psychiatry: Antidepressants - Serotonin Modulator Passed - 01/08/2023 12:32 PM      Passed - Completed PHQ-2 or PHQ-9 in the last 360 days      Passed - Valid encounter within last 6 months    Recent Outpatient Visits           1 week ago Severe episode of recurrent major depressive disorder, without psychotic features (HCC)   [place] [facility] [place], Jolene T, NP   2 months ago Severe episode of recurrent major depressive disorder, without psychotic features (HCC)   [place] [facility] [place], Jolene T, NP   3 months ago Severe episode of recurrent major depressive disorder, without psychotic features (HCC)   [place] [facility] [place], Jolene T, NP   5 months ago Severe episode of recurrent major depressive disorder, without psychotic features (HCC)   [place] [facility] [place], Jolene T, NP   6 months ago Severe episode of recurrent major depressive disorder, without psychotic features (HCC)   [place] [facility] [place], Dorie Rank, NP       Future Appointments             In 5 months Cannady, Dorie Rank, NP [facility]

## 2023-01-11 ENCOUNTER — Other Ambulatory Visit: Payer: Self-pay | Admitting: Nurse Practitioner

## 2023-01-15 NOTE — Telephone Encounter (Signed)
Dc'd 11/02/22 by Shela Commons. Cannady NP "pt not taking"  Requested Prescriptions  Refused Prescriptions Disp Refills   traZODone (DESYREL) 50 MG tablet [Pharmacy Med Name: TRAZODONE 50 MG TABLET] 90 tablet 0    Sig: TAKE 1/2 TO 1 TABLET BY MOUTH AT BEDTIME AS NEEDED FOR SLEEP     Psychiatry: Antidepressants - Serotonin Modulator Passed - 01/11/2023  2:32 PM      Passed - Completed PHQ-2 or PHQ-9 in the last 360 days      Passed - Valid encounter within last 6 months    Recent Outpatient Visits           2 weeks ago Severe episode of recurrent major depressive disorder, without psychotic features (HCC)   [place] [facility] [place], Jolene T, NP   2 months ago Severe episode of recurrent major depressive disorder, without psychotic features (HCC)   [place] [facility] [employer], Jolene T, NP   3 months ago Severe episode of recurrent major depressive disorder, without psychotic features (HCC)   [place] [facility] [place], Jolene T, NP   5 months ago Severe episode of recurrent major depressive disorder, without psychotic features (HCC)   [place] [facility] [place], Jolene T, NP   6 months ago Severe episode of recurrent major depressive disorder, without psychotic features (HCC)   [place] [facility] [place], Dorie Rank, NP       Future Appointments             In 5 months Cannady, Dorie Rank, NP [facility]

## 2023-01-18 ENCOUNTER — Ambulatory Visit: Payer: Medicare Other | Admitting: Clinical

## 2023-01-18 DIAGNOSIS — F331 Major depressive disorder, recurrent, moderate: Secondary | ICD-10-CM | POA: Diagnosis not present

## 2023-01-18 NOTE — Progress Notes (Signed)
                Dezi Schaner, LCSW 

## 2023-01-18 NOTE — Progress Notes (Signed)
[facility] Counselor/Therapist Progress Note  Patient ID: Marie Perry, MRN: [id number],    Date: 01/18/2023  Time Spent: 9:31am - 10:35am : 64 minutes   Treatment Type: Individual Therapy  Reported Symptoms: difficulty completing tasks, loss of motivation, tearfulness at times  Mental Status Exam: Appearance:  Neat and Well Groomed     Behavior: Appropriate  Motor: Normal  Speech/Language:  Clear and Coherent  Affect: Appropriate  Mood: normal  Thought process: normal  Thought content:   WNL  Sensory/Perceptual disturbances:   WNL  Orientation: oriented to person, place, and situation  Attention: Good  Concentration: Good  Memory: WNL  Fund of knowledge:  Good  Insight:   Good  Judgment:  Good  Impulse Control: Good   Risk Assessment: Danger to Self:  No Patient denied current suicidal ideation  Self-injurious Behavior: No Danger to Others: No Patient denied current homicidal ideation Duty to Warn:no Physical Aggression / Violence:No  Access to Firearms a concern: No  Gang Involvement:No   Subjective: Patient stated, "pretty good, just stressed" in response to events since last session. Patient reported stressors related to finances and insurance. Patient reported she is trying to develop a daily cleaning schedule. Patient reported she continues to experience difficulty completing tasks. Patient reported she has stacks of papers on her desk and delays completing tasks. Patient stated,  "I feel like I'm defeated before I even get started on doing stuff". Patient reported she was talking to her brother recently about an individual that had passed away and became tearful. Patient reported during sister's recent visit patient/sister went to visit mother's gravesite and patient reported she feels her sister's grief is delayed. Patient stated, "I'm putting myself on a deadline, I should be feeling better than this" in reference to grief. Patient reported she  utilized her spiritual beliefs to encourage patient's sister and to cope with the loss of her mother. Patient stated, "I'm anxious and want to be over that display of emotion unexpectedly". Patient reported feeling unmotivated. Patient reported during a recent visit with her PCP patient's PCP discussed changing patient's antidepressant medication. Patient reported she requested to reevaluate changing her medication at a later time. Patient stated, "I'm just tired" in response to patient's current mood.   Interventions: Cognitive Behavioral Therapy and solution focused.  Clinician conducted session via caregility video from clinician's home office. Patient provided verbal consent to proceed with telehealth session and is aware of limitations of telephone or video visits. Patient participated in session from patient's home. Reviewed events since last session.  Explored and identified triggers for feeling "stressed". Discussed patient's concerns related to difficulty completing tasks.  Discussed additional strategies to assist patient in completing tasks, such as, making a list of tasks to complete, prioritize list of tasks, highlighting tasks with deadlines, setting smaller goals, using a timer, using a Psychiatrist. Discussed recent triggers for grief and coping strategies patient utilized in response. Provided psycho education related to grief, triggers, and depressive symptoms. Provided psycho education related to psychotropic medications. Assessed patient's mood. Clinician requested for homework patient continue thought record and practice organizational strategies.    Collaboration of Care: Other not required at this time   Diagnosis:  Major depressive disorder, recurrent episode, moderate (HCC)     Plan: Patient is to utilize [employer] Therapy, thought re-framing, behavioral activation, and coping strategies to decrease symptoms associated with their diagnosis. Frequency: bi-weekly   Modality: individual      Long-term goal:   Reduce overall  level, frequency, and intensity of the feelings of depression as evidenced by decreased crying, difficulty falling asleep and staying asleep, changes in appetite, lack of energy, fatigue, lack of motivation, difficulty focusing, and loss of interest from 7 days/week to 2 to 3 days/week per patient report for at least 3 consecutive months. Target Date: 08/17/23  Progress: progressing    Short-term goal:  Begin a healthy grieving process around the loss of patient's mother Target Date: 03-02-23  Progress: progressing    Decrease negative thoughts associated with patient's mother's death Target Date: 03-02-2023  Progress: progressing    Identify, challenge, and replace negative thought patterns that contribute to feelings of depression with positive thoughts and beliefs per patient's report Target Date: March 02, 2023  Progress: progressing    Increase activities that patient enjoys to support increased motivation and mood, such as, socializing with friends weekly, completing puzzles, playing games. Target Date: 03-02-2023  Progress: progressing     [name], LCSW

## 2023-02-01 ENCOUNTER — Ambulatory Visit: Payer: Medicare Other | Admitting: Clinical

## 2023-02-01 DIAGNOSIS — F331 Major depressive disorder, recurrent, moderate: Secondary | ICD-10-CM | POA: Diagnosis not present

## 2023-02-01 NOTE — Progress Notes (Signed)
[facility] Counselor/Therapist Progress Note  Patient ID: Marie Perry, MRN: [id number],    Date: 02/01/2023  Time Spent: 9:34am - 10:29am : 55 minutes   Treatment Type: Individual Therapy  Reported Symptoms: fatigue, increased sleep yesterday, frustration, and sadness  Mental Status Exam: Appearance:  Neat and Well Groomed     Behavior: Yawning during session  Motor: Normal  Speech/Language:  Clear and Coherent  Affect: Appropriate  Mood: normal  Thought process: tangential  Thought content:   Tangential  Sensory/Perceptual disturbances:   WNL  Orientation: oriented to person, place, and situation  Attention: Good  Concentration: Good  Memory: WNL  Fund of knowledge:  Good  Insight:   Good  Judgment:  Good  Impulse Control: Good   Risk Assessment: Danger to Self:  No Patient denied current suicidal ideation  Self-injurious Behavior: No Danger to Others: No Patient denied current homicidal ideation Duty to Warn:no Physical Aggression / Violence:No  Access to Firearms a concern: No  Gang Involvement:No   Subjective: Patient reported she is trying to organize her desk. Patient stated, "slowly but it's going" in reference to patient's progress organizing her desk. Patient reported she purchased a Visual merchandiser that sits on her desk and a file box that attaches to the side of the organizer.  Patient reported she has been utilizing a task list and checking tasks off of her list after the tasks are completed. Patient reported she is trying to prepare for her brother/sister in law's arrival and is planning a dinner for nine guests. Patient reported she was cleaning a room in preparation for guests and she experienced increased sweating while cleaning. Patient reported she experiences increased sweating due to a side effect from patient's medications but reported she has not experienced the side effect as intensely as she did while cleaning. Patient reported  she has been experiencing fatigue and increased sleep yesterday. Patient stated,  "it's not been the greatest" in response to patient's mood. Patient reported "frustration about getting things done" and reported she has experienced sadness related to grief. Patient stated, "fair" mood the majority of days.  Patient reported "this time of year" is a trigger for grief and stated, "I have seasonal depression too". Patient reported patient's PCP has discussed with patient the possibility of changing patient's medication in the upcoming months.    Interventions: Cognitive Behavioral Therapy. Clinician conducted session via caregility video from clinician's home office. Patient provided verbal consent to proceed with telehealth session and is aware of limitations of telephone or video visits. Patient participated in session from patient's home. Reviewed events since last session. Discussed patient's implementation of organizational strategies previously discussed in session and the outcome. Assessed patient's mood since last session and current mood. Discussed current symptoms and recommended patient follow up with patient's PCP. Provided psycho education related to depressive symptoms, anxiety, adjustment disorders. Explored and identified triggers for decline in mood. Provided psycho education related to gratitude exercises. Clinician requested for homework patient continue thought record, practice organizational strategies, and complete a gratitude journal.      Collaboration of Care: Other not required at this time   Diagnosis:  Major depressive disorder, recurrent episode, moderate (HCC)     Plan: Patient is to utilize [employer] Therapy, thought re-framing, behavioral activation, and coping strategies to decrease symptoms associated with their diagnosis. Frequency: bi-weekly  Modality: individual      Long-term goal:   Reduce overall level, frequency, and intensity of the feelings of  depression as  evidenced by decreased crying, difficulty falling asleep and staying asleep, changes in appetite, lack of energy, fatigue, lack of motivation, difficulty focusing, and loss of interest from 7 days/week to 2 to 3 days/week per patient report for at least 3 consecutive months. Target Date: 08/17/23  Progress: progressing    Short-term goal:  Begin a healthy grieving process around the loss of patient's mother Target Date: March 19, 2023  Progress: progressing    Decrease negative thoughts associated with patient's mother's death Target Date: 03/19/2023  Progress: progressing    Identify, challenge, and replace negative thought patterns that contribute to feelings of depression with positive thoughts and beliefs per patient's report Target Date: Mar 19, 2023  Progress: progressing    Increase activities that patient enjoys to support increased motivation and mood, such as, socializing with friends weekly, completing puzzles, playing games. Target Date: 19-Mar-2023  Progress: progressing    [name], LCSW

## 2023-02-01 NOTE — Progress Notes (Signed)
                Dezi Schaner, LCSW 

## 2023-02-15 ENCOUNTER — Ambulatory Visit (INDEPENDENT_AMBULATORY_CARE_PROVIDER_SITE_OTHER): Payer: Medicare Other | Admitting: Clinical

## 2023-02-15 DIAGNOSIS — F331 Major depressive disorder, recurrent, moderate: Secondary | ICD-10-CM | POA: Diagnosis not present

## 2023-02-15 NOTE — Progress Notes (Signed)
   [name], LCSW

## 2023-02-15 NOTE — Progress Notes (Signed)
 [facility] Counselor/Therapist Progress Note  Patient ID: Normalee Sistare, MRN: [id number],    Date: 02/15/2023  Time Spent: 9:35am - 10:26am : 51 minutes  Treatment Type: Individual Therapy  Reported Symptoms: tearful, sadness  Mental Status Exam: Appearance:  Neat and Well Groomed     Behavior: Appropriate  Motor: Normal  Speech/Language:  Clear and Coherent  Affect: Tearful  Mood: sad  Thought process: normal  Thought content:   WNL  Sensory/Perceptual disturbances:   WNL  Orientation: oriented to person, place, and situation  Attention: Good  Concentration: Good  Memory: WNL  Fund of knowledge:  Good  Insight:   Good  Judgment:  Good  Impulse Control: Good   Risk Assessment: Danger to Self:  No Patient denied current suicidal ideation  Self-injurious Behavior: No Danger to Others: No Patient denied current homicidal ideation Duty to Warn:no Physical Aggression / Violence:No  Access to Firearms a concern: No  Gang Involvement:No   Subjective: Patient reported she received a letter of remembrance from the hospice provider that was provided care for patient's mother prior to her mother's passing. Patient stated,  it just stirred up some bad memories in reference to the letter. Patient stated, I was trying to get past all of that and it brings a lot of it back.  Patient stated, all of the things I have been reliving since momma died. Patient reported she questions if her mother was aware of the events that occurred during the last several days of her mother's life. Patient stated, I hope I wasn't making her unnecessarily uncomfortable taking her to the doctor this last time. Patient stated, I feel like she was trying so hard to help me help her. Patient reported feeling I didn't take it seriously enough at first in reference to initial symptoms of Dementia patient's mother exhibited. Patient reported her mother initially experienced changes in  mood and suspicion. Patient stated, I guess I have regrets. Patient stated,  trying to correct her (mother) from what she was saying, what she was thinking, I wish I had more insight into what was going to go wrong than I did in reference to feelings of regret.    Interventions: Cognitive Behavioral Therapy and supportive therapy . Clinician conducted session via caregility video from clinician's home office. Patient provided verbal consent to proceed with telehealth session and is aware of limitations of telephone or video visits. Patient participated in session from patient's home. Reviewed events since last session. Assisted patient in exploring and identifying thoughts and feelings triggered by receipt of letter from hospice provider. Challenged cognitive distortions to assist patient in reframing events prior to mother's passing. Provided supportive therapy, active listening and validation as patient discussed events occurring prior to patient's mother's passing. Assisted patient in exploring and identifying thoughts associated with feelings of regret. Provided psycho education related to symptoms of Dementia. Clinician requested for homework patient continue thought record, practice organizational strategies, and complete a gratitude journal.      Collaboration of Care: Other not required at this time   Diagnosis:  Major depressive disorder, recurrent episode, moderate (HCC)     Plan: Patient is to utilize [employer] Therapy, thought re-framing, behavioral activation, and coping strategies to decrease symptoms associated with their diagnosis. Frequency: bi-weekly  Modality: individual      Long-term goal:   Reduce overall level, frequency, and intensity of the feelings of depression as evidenced by decreased crying, difficulty falling asleep and staying asleep, changes in appetite,  lack of energy, fatigue, lack of motivation, difficulty focusing, and loss of interest from 7  days/week to 2 to 3 days/week per patient report for at least 3 consecutive months. Target Date: 08/17/23  Progress: progressing    Short-term goal:  Begin a healthy grieving process around the loss of patient's mother Target Date: 2023-02-26  Progress: progressing    Decrease negative thoughts associated with patient's mother's death Target Date: 02-26-2023  Progress: progressing    Identify, challenge, and replace negative thought patterns that contribute to feelings of depression with positive thoughts and beliefs per patient's report Target Date: 02/26/2023  Progress: progressing    Increase activities that patient enjoys to support increased motivation and mood, such as, socializing with friends weekly, completing puzzles, playing games. Target Date: Feb 26, 2023  Progress: progressing    [name], LCSW

## 2023-02-18 ENCOUNTER — Ambulatory Visit
Admission: RE | Admit: 2023-02-18 | Discharge: 2023-02-18 | Disposition: A | Discharge status: Home / Self Care | Payer: Medicare Other | Source: Ambulatory Visit | Attending: Nurse Practitioner | Admitting: Nurse Practitioner

## 2023-02-18 DIAGNOSIS — Z780 Asymptomatic menopausal state: Secondary | ICD-10-CM | POA: Diagnosis not present

## 2023-02-18 DIAGNOSIS — Z1231 Encounter for screening mammogram for malignant neoplasm of breast: Secondary | ICD-10-CM | POA: Insufficient documentation

## 2023-02-18 DIAGNOSIS — M810 Age-related osteoporosis without current pathological fracture: Secondary | ICD-10-CM

## 2023-02-19 NOTE — Progress Notes (Signed)
 Contacted via MyChart   Good morning [name] your bone density has returned and shows some mild improvement from previous.  Continue Boniva  and we will recheck in 2 years.  Any questions? Keep being amazing!!  Thank you for allowing me to participate in your care.  I appreciate you. Kindest regards, Norrine Ballester

## 2023-02-19 NOTE — Progress Notes (Signed)
 Contacted via MyChart   Normal mammogram, may repeat in one year:)

## 2023-03-01 ENCOUNTER — Ambulatory Visit (INDEPENDENT_AMBULATORY_CARE_PROVIDER_SITE_OTHER): Payer: Medicare Other | Admitting: Clinical

## 2023-03-01 DIAGNOSIS — F331 Major depressive disorder, recurrent, moderate: Secondary | ICD-10-CM | POA: Diagnosis not present

## 2023-03-01 NOTE — Progress Notes (Signed)
                Dezi Schaner, LCSW 

## 2023-03-01 NOTE — Progress Notes (Signed)
[facility] Counselor/Therapist Progress Note  Patient ID: Marie Perry, MRN: [id number],    Date: 03/01/2023  Time Spent: 9:34am - 10:26am : 52 minutes  Treatment Type: Individual Therapy  Reported Symptoms: crying, sadness  Mental Status Exam: Appearance:  Neat and Well Groomed     Behavior: Appropriate  Motor: Normal  Speech/Language:  Clear and Coherent  Affect: Tearful  Mood: sad  Thought process: normal  Thought content:   WNL  Sensory/Perceptual disturbances:   WNL  Orientation: oriented to person, place, and situation  Attention: Good  Concentration: Good  Memory: WNL  Fund of knowledge:  Good  Insight:   Good  Judgment:  Good  Impulse Control: Good   Risk Assessment: Danger to Self:  No Patient denied current suicidal ideation  Self-injurious Behavior: No Danger to Others: No Patient denied current homicidal ideation Duty to Warn:no Physical Aggression / Violence:No  Access to Firearms a concern: No  Gang Involvement:No   Subjective: Patient stated, "honestly I forgot we had a meeting today". Patient reported she did not get enough rest last night. Patient stated, "things have been going", "day to day things that wear on your nerves".  Patient reported she was experiencing difficulty with her phone system. Patient stated, "seems like I've been trying to get some things done but I'm not wanting to do anything". Patient stated, "I don't know why a date should be any worse reliving a year ago". Patient reported her mother passed away on 2025-02-14of last year. Patient reported she plans to spend 14-Feb-2025of this year with her sister and they plan to attend the [employer].  Patient reported her mother loved dessert. Patient stated, "I think that's a good idea" in response to having a dessert in honor of her mother. Patient reported her mother liked chocolate and liked fudge. During today's session, patient shared several positive memories  about her mother.  Patient reported she has experienced depressed mood recently. Patient stated, "sometimes I feel like I'm doing better", "2-3 days unless I'm working is blah", "I've started several projects and haven't gotten any of them done" in response to patient's progress towards goals.   Interventions: Cognitive Behavioral Therapy. Clinician conducted session via caregility video from clinician's home office. Patient provided verbal consent to proceed with telehealth session and is aware of limitations of telephone or video visits. Patient participated in session from patient's home. Reviewed events since last session. Assessed patient's mood since last session and current mood. Assisted patient in exploring and identifying triggers for depressive symptoms. Provided psycho education related to depression and grief. Discussed the upcoming anniversary of patient's mother's passing and ways patient plans to spend that day.  Explored coping strategies to utilize on the anniversary of patient's mother's passing and ways to celebrate her mother's life, such as, having a dessert in honor of her mother. Provided supportive therapy and active listening as patient shared positive memories about patient's mother. Reviewed patient's goals for therapy and patient's progress. Clinician requested for homework patient continue thought record, practice organizational strategies, and complete a gratitude journal.     Collaboration of Care: Other not required at this time   Diagnosis:  Major depressive disorder, recurrent episode, moderate (HCC)     Plan: Patient is to utilize [employer] Therapy, thought re-framing, behavioral activation, and coping strategies to decrease symptoms associated with their diagnosis. Frequency: bi-weekly  Modality: individual      Long-term goal:   Reduce overall level, frequency, and  intensity of the feelings of depression as evidenced by decreased crying, difficulty  falling asleep and staying asleep, changes in appetite, lack of energy, fatigue, lack of motivation, difficulty focusing, and loss of interest from 7 days/week to 2 to 3 days/week per patient report for at least 3 consecutive months. Target Date: Sep 06, 2023  Progress: progressing    Short-term goal:  Begin a healthy grieving process around the loss of patient's mother Target Date: 06-Sep-2023  Progress: progressing    Decrease negative thoughts associated with patient's mother's death Target Date: 09/06/2023  Progress: progressing    Identify, challenge, and replace negative thought patterns that contribute to feelings of depression with positive thoughts and beliefs per patient's report Target Date: Sep 06, 2023  Progress: progressing    Increase activities that patient enjoys to support increased motivation and mood, such as, socializing with friends weekly, completing puzzles, playing games. Target Date: 09/06/23  Progress: progressing    [name], LCSW

## 2023-03-11 ENCOUNTER — Other Ambulatory Visit: Payer: Self-pay | Admitting: Nurse Practitioner

## 2023-03-11 ENCOUNTER — Ambulatory Visit: Payer: Medicare Other | Admitting: Clinical

## 2023-03-11 DIAGNOSIS — F331 Major depressive disorder, recurrent, moderate: Secondary | ICD-10-CM

## 2023-03-11 DIAGNOSIS — F4321 Adjustment disorder with depressed mood: Secondary | ICD-10-CM

## 2023-03-11 DIAGNOSIS — F332 Major depressive disorder, recurrent severe without psychotic features: Secondary | ICD-10-CM

## 2023-03-11 NOTE — Progress Notes (Signed)
[facility] Counselor/Therapist Progress Note  Patient ID: Marie Perry, MRN: [id number],    Date: 03/11/2023  Time Spent: 10:32am - 11:28am : 56 minutes  Treatment Type: Individual Therapy  Reported Symptoms: Patient reported "blah" mood, tearful  Mental Status Exam: Appearance:  Neat and Well Groomed     Behavior: Appropriate  Motor: Normal  Speech/Language:  Clear and Coherent  Affect: Tearful  Mood: sad  Thought process: normal  Thought content:   WNL  Sensory/Perceptual disturbances:   WNL  Orientation: oriented to person, place, and situation  Attention: Good  Concentration: Good  Memory: WNL  Fund of knowledge:  Good  Insight:   Good  Judgment:  Good  Impulse Control: Good   Risk Assessment: Danger to Self:  No Patient denied current suicidal ideation  Self-injurious Behavior: No Danger to Others: No Patient denied current homicidal ideation Duty to Warn:no Physical Aggression / Violence:No  Access to Firearms a concern: No  Gang Involvement:No   Subjective: Patient stated, "I went to my sister's for [place] to be together so I wouldn't be home by myself thinking about a year ago". Patient reported patient/sister decided to go out to have a dessert in honor of their mother and it started snowing.  Patient stated, "it was one of those weeks that was blah". Patient stated, "I was thinking about mom". Patient stated, "it wasn't just that day, I was reliving things" and reported "reliving" thoughts throughout the month. Patient reported experiencing the following thoughts,  "what her life was like during her dying process, if I could have done something else to make her comfortable, how much she was aware". Patient stated, "what could I have done". Patient reported wishing patient was aware of her mother's diagnosis earlier in the disease process. During today's session patient shared several situations related to patient's mother's health that patient  questions. Patient stated, "you don't want to but you feel like you gotta second guess everything you tried to do". Patient stated, "thinking about mom and the age that she passed away" is a recent thought.   Interventions: Cognitive Behavioral Therapy. Clinician conducted session via caregility video from clinician's home office. Patient provided verbal consent to proceed with telehealth session and is aware of limitations of telephone or video visits. Patient participated in session from patient's home. Reviewed events since last session. Explored and identified triggers for recent decline in mood. Assisted patient in examining thoughts associated with recent decline in mood. Challenged statements/thought distortions and assisted patient in reframing situations associated with patient's mother's health. Provided psycho education related to symptoms of Dementia. Provided psycho education related to challenging cognitive distortions/unhelpful thoughts. Provided psycho education related to depressive symptoms/grief. Clinician requested for homework patient continue thought record and practice challenging cognitive distortions.     Collaboration of Care: Other not required at this time   Diagnosis:  Major depressive disorder, recurrent episode, moderate (HCC)     Plan: Patient is to utilize [employer] Therapy, thought re-framing, behavioral activation, and coping strategies to decrease symptoms associated with their diagnosis. Frequency: bi-weekly  Modality: individual      Long-term goal:   Reduce overall level, frequency, and intensity of the feelings of depression as evidenced by decreased crying, difficulty falling asleep and staying asleep, changes in appetite, lack of energy, fatigue, lack of motivation, difficulty focusing, and loss of interest from 7 days/week to 2 to 3 days/week per patient report for at least 3 consecutive months. Target Date: 08/17/23  Progress: progressing  Short-term goal:  Begin a healthy grieving process around the loss of patient's mother Target Date: 09/11/23  Progress: progressing    Decrease negative thoughts associated with patient's mother's death Target Date: 09-11-23  Progress: progressing    Identify, challenge, and replace negative thought patterns that contribute to feelings of depression with positive thoughts and beliefs per patient's report Target Date: September 11, 2023  Progress: progressing    Increase activities that patient enjoys to support increased motivation and mood, such as, socializing with friends weekly, completing puzzles, playing games. Target Date: 09-11-23  Progress: progressing    [name], LCSW

## 2023-03-11 NOTE — Progress Notes (Signed)
[name], LCSW

## 2023-03-12 NOTE — Telephone Encounter (Signed)
Requested Prescriptions  Refused Prescriptions Disp Refills   buPROPion (WELLBUTRIN XL) 150 MG 24 hr tablet [Pharmacy Med Name: BUPROPION HCL XL 150 MG TABLET] 90 tablet 2    Sig: TAKE 1 TABLET BY MOUTH EVERY DAY     Psychiatry: Antidepressants - bupropion Passed - 03/12/2023  4:48 PM      Passed - Cr in normal range and within 360 days    Creatinine, Ser  Date Value Ref Range Status  06/21/2022 0.86 0.57 - 1.00 mg/dL Final         Passed - AST in normal range and within 360 days    AST  Date Value Ref Range Status  06/21/2022 19 0 - 40 IU/L Final         Passed - ALT in normal range and within 360 days    ALT  Date Value Ref Range Status  06/21/2022 17 0 - 32 IU/L Final         Passed - Completed PHQ-2 or PHQ-9 in the last 360 days      Passed - Last BP in normal range    BP Readings from Last 1 Encounters:  12/27/22 129/78         Passed - Valid encounter within last 6 months    Recent Outpatient Visits           2 months ago Severe episode of recurrent major depressive disorder, without psychotic features (HCC)   [place] [facility] [place], [place] T, NP   4 months ago Severe episode of recurrent major depressive disorder, without psychotic features (HCC)   [place] [facility] [place], Jolene T, NP   5 months ago Severe episode of recurrent major depressive disorder, without psychotic features (HCC)   [place] [facility] [place], Jolene T, NP   7 months ago Severe episode of recurrent major depressive disorder, without psychotic features (HCC)   [place] [facility] [place], Jolene T, NP   8 months ago Severe episode of recurrent major depressive disorder, without psychotic features (HCC)   [place] [facility] [place], Dorie Rank, NP       Future Appointments             In 3 months [place], Dorie Rank, NP [place] [facility]

## 2023-03-15 ENCOUNTER — Ambulatory Visit: Payer: Medicare Other | Admitting: Clinical

## 2023-03-29 ENCOUNTER — Ambulatory Visit: Payer: Medicare Other | Admitting: Clinical

## 2023-03-29 DIAGNOSIS — F331 Major depressive disorder, recurrent, moderate: Secondary | ICD-10-CM

## 2023-03-29 NOTE — Progress Notes (Signed)
Marie Barthel, LCSW

## 2023-03-29 NOTE — Progress Notes (Signed)
[facility] Counselor/Therapist Progress Note  Patient ID: Marie Perry, MRN: [id number],    Date: 03/29/2023  Time Spent: 9:35am - 10:21am : 46 minutes   Treatment Type: Individual Therapy  Reported Symptoms: tearful, sadness, worry  Mental Status Exam: Appearance:  Neat and Well Groomed     Behavior: Appropriate  Motor: Normal  Speech/Language:  Clear and Coherent and Normal Rate  Affect: Tearful  Mood: Patient reported "fair" mood  Thought process: normal  Thought content:   WNL  Sensory/Perceptual disturbances:   WNL  Orientation: oriented to person, place, and situation  Attention: Good  Concentration: Good  Memory: WNL  Fund of knowledge:  Good  Insight:   Good  Judgment:  Good  Impulse Control: Good   Risk Assessment: Danger to Self:  No Patient denied current suicidal ideation  Self-injurious Behavior: No Danger to Others: No Patient denied current homicidal ideation Duty to Warn:no Physical Aggression / Violence:No  Access to Firearms a concern: No  Gang Involvement:No   Subjective: Patient stated, "it's been good, no big things going on" in response to events since last session. Patient reported patient's brother and sister in law are currently vacationing in [place] and patient received a text message that patient's brother developed the flu. Patient reported experiencing the following thoughts in response: "what would I do, how would I get them (brother/sister in law) home, what would I do with them after I get them home". Patient reported her brother and sister in law went to an urgent care in [place] and were treated for the flu. Patient reported her brother and sister in law are staying with friends that are caring for brother/sister in law as they recover from the flu. Patient reported her mother experienced "what if" thoughts. Patient stated, "I don't like anticipating".  Patient stated, "nothing that I know of" in response to  evidence to support patient's thought/worry. Patient acknowledged she was basing her thought/worry on feelings. Patient stated, "I notice something else I did when I was thinking about that, was what if I lost them (brother/sister in law)". Patient stated, "I don't want to experience losing anybody else". Patient reported feeling close to her brother and stated, "our past history together makes [place] feel more connected". Patient stated, "I thought it was about fair" in response to patient's current mood.   Interventions: Cognitive Behavioral Therapy. Clinician conducted session via caregility video from clinician's home office. Patient provided verbal consent to proceed with telehealth session and is aware of limitations of telephone or video visits. Patient participated in session from patient's home. Reviewed events since last session. Assisted patient in examining thoughts/feelings triggered by her brother developing the flu. Challenged cognitive distortions and assisted patient in practicing challenging distortions/identifying evidence for/against distortions. Provided psycho education related to cognitive distortion/unhelpful thinking styles. Provided psycho education related to learned behaviors and cycle of anxiety. Assisted patient in examining patient's emotional response to thoughts of losing her brother. Clinician requested for homework patient continue thought record and practice challenging cognitive distortions.     Collaboration of Care: Other not required at this time   Diagnosis:  Major depressive disorder, recurrent episode, moderate with anxious distress     Plan: Patient is to utilize [employer], thought re-framing, behavioral activation, and coping strategies to decrease symptoms associated with their diagnosis. Frequency: bi-weekly  Modality: individual      Long-term goal:   Reduce overall level, frequency, and intensity of the feelings of depression as evidenced  by  decreased crying, difficulty falling asleep and staying asleep, changes in appetite, lack of energy, fatigue, lack of motivation, difficulty focusing, and loss of interest from 7 days/week to 2 to 3 days/week per patient report for at least 3 consecutive months. Target Date: 05-Sep-2023  Progress: progressing    Short-term goal:  Begin a healthy grieving process around the loss of patient's mother Target Date: 2023-09-05  Progress: progressing    Decrease negative thoughts associated with patient's mother's death Target Date: 09/05/2023  Progress: progressing    Identify, challenge, and replace negative thought patterns that contribute to feelings of depression with positive thoughts and beliefs per patient's report Target Date: Sep 05, 2023  Progress: progressing    Increase activities that patient enjoys to support increased motivation and mood, such as, socializing with friends weekly, completing puzzles, playing games. Target Date: Sep 05, 2023  Progress: progressing    [name], LCSW

## 2023-04-12 ENCOUNTER — Ambulatory Visit: Payer: Medicare Other | Admitting: Clinical

## 2023-04-12 DIAGNOSIS — F331 Major depressive disorder, recurrent, moderate: Secondary | ICD-10-CM

## 2023-04-12 NOTE — Progress Notes (Signed)
 [facility] Counselor/Therapist Progress Note  Patient ID: Marie Perry, MRN: [id number],    Date: 04/12/2023  Time Spent: 9:48am - 10:21am : 33 minutes   Treatment Type: Individual Therapy  Reported Symptoms: worry  Mental Status Exam: Appearance:  Neat and Well Groomed     Behavior: Appropriate  Motor: Normal  Speech/Language:  Clear and Coherent and Normal Rate  Affect: Appropriate  Mood: normal  Thought process: normal  Thought content:   WNL  Sensory/Perceptual disturbances:   WNL  Orientation: oriented to person, place, and situation  Attention: Good  Concentration: Good  Memory: WNL  Fund of knowledge:  Good  Insight:   Good  Judgment:  Good  Impulse Control: Good   Risk Assessment: Danger to Self:  No Patient denied current suicidal ideation  Self-injurious Behavior: No Danger to Others: No Patient denied current homicidal ideation Duty to Warn:no Physical Aggression / Violence:No  Access to Firearms a concern: No  Gang Involvement:No   Subjective: Patient was delayed logging into today's session and patient reported she overslept for today's session. Patient stated, "doing pretty good" in response to events since last session. Patient stated, "I've been busy but taking my time trying to do things". Patient stated, "I still have that anxiousness some times". Patient reported she is trying to complete tasks on patient's list and stated, "that made me happy".  Patient reported she is trying to focus on one task at a time and has found focusing on one task at a time helpful. Patient reported she is trying to refrain from becoming irritated when an event arises that changes patient's schedule/routine. Patient reported she was feeling anxious about a tree falling in patient's yard and patient reported she practiced challenging the thoughts/worry associated with the tree. Patient stated, "I kept thinking, do I have a right to be worried, evidence to  back that up". Patient stated,  "I felt tickled with myself that I got some things done" in reference to patient's mood after completing tasks. Patient reported she worry's about patient's brother and sister in law being on a plane during their return trip home and being susceptible to germs.   Interventions: Cognitive Behavioral Therapy. Clinician conducted session via caregility video from clinician's home office. Patient provided verbal consent to proceed with telehealth session and is aware of limitations of telephone or video visits. Patient participated in session from patient's home. Reviewed events since last session. Reviewed strategies patient has implemented to complete tasks since last session and the outcome. Discussed feelings of worry related to a fallen tree and reviewed patient's implementation of challenging cognitive distortions/worry. Assessed patient's mood. Clinician requested for homework patient continue thought record and practice challenging cognitive distortions   Collaboration of Care: Other not required at this time   Diagnosis:  Major depressive disorder, recurrent episode, moderate with anxious distress     Plan: Patient is to utilize [employer] Therapy, thought re-framing, behavioral activation, and coping strategies to decrease symptoms associated with their diagnosis. Frequency: bi-weekly  Modality: individual      Long-term goal:   Reduce overall level, frequency, and intensity of the feelings of depression as evidenced by decreased crying, difficulty falling asleep and staying asleep, changes in appetite, lack of energy, fatigue, lack of motivation, difficulty focusing, and loss of interest from 7 days/week to 2 to 3 days/week per patient report for at least 3 consecutive months. Target Date: 08/17/23  Progress: progressing    Short-term goal:  Begin a healthy grieving  process around the loss of patient's mother Target Date: 04-Sep-2023  Progress:  progressing    Decrease negative thoughts associated with patient's mother's death Target Date: 04-Sep-2023  Progress: progressing    Identify, challenge, and replace negative thought patterns that contribute to feelings of depression with positive thoughts and beliefs per patient's report Target Date: 09/04/23  Progress: progressing    Increase activities that patient enjoys to support increased motivation and mood, such as, socializing with friends weekly, completing puzzles, playing games. Target Date: 09/04/23  Progress: progressing     [name], LCSW

## 2023-04-12 NOTE — Progress Notes (Signed)
   [name], LCSW

## 2023-04-16 ENCOUNTER — Ambulatory Visit: Payer: Medicare Other

## 2023-04-16 VITALS — Ht 66.5 in | Wt 190.0 lb

## 2023-04-16 DIAGNOSIS — Z0000 Encounter for general adult medical examination without abnormal findings: Secondary | ICD-10-CM

## 2023-04-16 NOTE — Patient Instructions (Addendum)
 Marie Perry , Thank you for taking time to come for your Medicare Wellness Visit. I appreciate your ongoing commitment to your health goals. Please review the following plan we discussed and let me know if I can assist you in the future.   Referrals/Orders/Follow-Ups/Clinician Recommendations: Keep up the good work!! You can get the RSV vaccine at age 72, sooner if high risk.  This is a list of the screening recommended for you and due dates:  Health Maintenance  Topic Date Due   Mammogram  02/18/2024   Medicare Annual Wellness Visit  04/15/2024   DEXA scan (bone density measurement)  02/17/2025   DTaP/Tdap/Td vaccine (2 - Td or Tdap) 10/06/2025   Colon Cancer Screening  05/25/2027   Pneumonia Vaccine  Completed   Flu Shot  Completed   COVID-19 Vaccine  Completed   Hepatitis C Screening  Completed   Zoster (Shingles) Vaccine  Completed   HPV Vaccine  Aged Out    Advanced directives: (In Chart) A copy of your advanced directives are scanned into your chart should your provider ever need it.  Next Medicare Annual Wellness Visit scheduled for next year: Yes, 04/21/24 @ 9:20am (phone visit)

## 2023-04-16 NOTE — Progress Notes (Signed)
 Subjective:   Marie Perry is a 73 y.o. who presents for a Medicare Wellness preventive visit.  Visit Complete: Virtual I connected with  Marie Perry on 04/16/23 by a audio enabled telemedicine application and verified that I am speaking with the correct person using two identifiers.  Patient Location: Home  Provider Location: Office/Clinic  I discussed the limitations of evaluation and management by telemedicine. The patient expressed understanding and agreed to proceed.  Vital Signs: Because this visit was a virtual/telehealth visit, some criteria may be missing or patient reported. Any vitals not documented were not able to be obtained and vitals that have been documented are patient reported.  VideoDeclined- This patient declined Librarian, academic. Therefore the visit was completed with audio only.  AWV Questionnaire: Yes: Patient Medicare AWV questionnaire was completed by the patient on 04/10/23; I have confirmed that all information answered by patient is correct and no changes since this date.  Cardiac Risk Factors include: advanced age (>64men, >58 women);obesity (BMI >30kg/m2);dyslipidemia;Other (see comment), Risk factor comments: OSA (cpap)     Objective:    Today's Vitals   04/16/23 0934  Weight: 190 lb (86.2 kg)  Height: 5' 6.5" (1.689 m)   Body mass index is 30.21 kg/m.     04/16/2023    9:46 AM 05/25/2022    8:46 AM 04/10/2022   11:37 AM 03/09/2021   11:36 AM 03/07/2020   10:32 AM 02/27/2018   10:32 AM 04/15/2017    9:14 AM  Advanced Directives  Does Patient Have a Medical Advance Directive? Yes Yes Yes Yes Yes Yes No  Type of Estate agent of [place];Living will Healthcare Power of [place];Living will Healthcare Power of [place];Living will Healthcare Power of [employer] of [place];Living will Healthcare Power of [place];Living will   Does patient want to make changes to medical  advance directive? No - Patient declined  No - Patient declined      Copy of Healthcare Power of Attorney in Chart? Yes - validated most recent copy scanned in chart (See row information)  Yes - validated most recent copy scanned in chart (See row information) Yes - validated most recent copy scanned in chart (See row information) Yes - validated most recent copy scanned in chart (See row information) Yes - validated most recent copy scanned in chart (See row information)     Current Medications (verified) Outpatient Encounter Medications as of 04/16/2023  Medication Sig   buPROPion (WELLBUTRIN XL) 300 MG 24 hr tablet Take 1 tablet (300 mg total) by mouth daily.   busPIRone (BUSPAR) 10 MG tablet Take 1 tablet (10 mg total) by mouth 2 (two) times daily.   Cholecalciferol (VITAMIN D PO) Take 2,000 Units by mouth daily.    Cyanocobalamin (B-12 PO) Take 1,000 mcg by mouth daily.    DULoxetine (CYMBALTA) 60 MG capsule Take 1 capsule (60 mg total) by mouth daily.   ibandronate (BONIVA) 150 MG tablet Take 1 tablet (150 mg total) by mouth every 30 (thirty) days. Take in the morning with a full glass of water, on an empty stomach, and do not take anything else by mouth or lie down for the next 30 min.   Melatonin 10 MG TABS Take 1 tablet by mouth daily.   rosuvastatin (CRESTOR) 10 MG tablet Take 1 tablet (10 mg total) by mouth daily.   Magnesium 400 MG TABS Take 1 tablet by mouth daily at 2 PM. (Patient not taking: Reported on  04/16/2023)   No facility-administered encounter medications on file as of 04/16/2023.    Allergies (verified) Bactrim [sulfamethoxazole-trimethoprim]   History: Past Medical History:  Diagnosis Date   Allergy    Anxiety    Arthritis    Cataract 02/2016   Yearly eye exam w/Dr. 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12, had "brights disease"   Depression    GERD (gastroesophageal reflux disease)    Hypertriglyceridemia    IBS (irritable bowel syndrome)    IFG (impaired  fasting glucose)    Osteopenia    Sleep apnea    Vitamin D deficiency    Past Surgical History:  Procedure Laterality Date   bright's surgery     COLONOSCOPY N/A 09/17/2016   Procedure: COLONOSCOPY;  Surgeon: Christena Deem, MD;  Location: [facility] ENDOSCOPY;  Service: Endoscopy;  Laterality: N/A;   COLONOSCOPY WITH PROPOFOL N/A 04/15/2017   Procedure: COLONOSCOPY WITH PROPOFOL;  Surgeon: Christena Deem, MD;  Location: [facility] ENDOSCOPY;  Service: Endoscopy;  Laterality: N/A;   COLONOSCOPY WITH PROPOFOL N/A 05/25/2022   Procedure: COLONOSCOPY WITH PROPOFOL;  Surgeon: Toney Reil, MD;  Location: [facility] ENDOSCOPY;  Service: Gastroenterology;  Laterality: N/A;   SKIN TAG REMOVAL     rectal   Family History  Problem Relation Age of Onset   Mental illness Mother    Depression Mother    Anxiety disorder Mother    Hearing loss Mother    Miscarriages / [place] Mother    Vision loss Mother    Dementia Mother    Parkinson's disease Mother    Stroke Father    Heart disease Father    Hearing loss Father    Vision loss Father    Depression Sister    Birth defects Sister    Early death Sister    Fibromyalgia Brother    GER disease Brother    Depression Brother    Depression Brother    Hearing loss Maternal Aunt    Vision loss Maternal Aunt    Varicose Veins Maternal Aunt    Cancer Maternal Uncle    Hearing loss Maternal Uncle    Kidney disease Maternal Uncle    Asthma Paternal Uncle    Early death Paternal Uncle    Heart disease Paternal Uncle    Stroke Paternal Uncle    Cancer Maternal Grandmother        ovarian   Anemia Maternal Grandmother    Arthritis Maternal Grandmother    Lung disease Maternal Grandfather    Birth defects Maternal Grandfather    Asthma Maternal Grandfather    Heart disease Paternal Grandmother    Birth defects Paternal Grandfather    Breast cancer Neg Hx    Social History   Socioeconomic History   Marital status: Divorced    Spouse name:  Not on file   Number of children: 0   Years of education: Not on file   Highest education level: Associate degree: occupational, Scientist, product/process development, or vocational program  Occupational History   Occupation: retired    Comment: works part time  Tobacco Use   Smoking status: Never   Smokeless tobacco: Never  Vaping Use   Vaping status: Never Used  Substance and Sexual Activity   Alcohol use: Yes    Alcohol/week: 0.0 standard drinks of alcohol    Comment: rarely indulge   Drug use: Never   Sexual activity: Not Currently  Other Topics Concern   Not on file  Social  History Narrative   Not on file   Social Drivers of Health   Financial Resource Strain: Medium Risk (04/16/2023)   Overall Financial Resource Strain (CARDIA)    Difficulty of Paying Living Expenses: Somewhat hard  Food Insecurity: No Food Insecurity (04/16/2023)   Hunger Vital Sign    Worried About Running Out of Food in the Last Year: Never true    Ran Out of Food in the Last Year: Never true  Transportation Needs: No Transportation Needs (04/16/2023)   PRAPARE - Administrator, Civil Service (Medical): No    Lack of Transportation (Non-Medical): No  Physical Activity: Inactive (04/16/2023)   Exercise Vital Sign    Days of Exercise per Week: 0 days    Minutes of Exercise per Session: 0 min  Stress: Stress Concern Present (04/16/2023)   [employer] of Occupational Health - Occupational Stress Questionnaire    Feeling of Stress : To some extent  Social Connections: Moderately Integrated (04/16/2023)   Social Connection and Isolation Panel [NHANES]    Frequency of Communication with Friends and Family: Twice a week    Frequency of Social Gatherings with Friends and Family: Three times a week    Attends Religious Services: More than 4 times per year    Active Member of Clubs or Organizations: Yes    Attends Engineer, structural: More than 4 times per year    Marital Status: Divorced    Tobacco  Counseling Counseling given: Not Answered    Clinical Intake:  Pre-visit preparation completed: Yes  Pain : No/denies pain     BMI - recorded: 30.21 Nutritional Status: BMI > 30  Obese Nutritional Risks: None Diabetes: No  How often do you need to have someone help you when you read instructions, pamphlets, or other written materials from your doctor or pharmacy?: 1 - Never  Interpreter Needed?: No  Information entered by :: Tora Kindred, CMA   Activities of Daily Living     04/16/2023    9:36 AM 04/10/2023   11:04 AM  In your present state of health, do you have any difficulty performing the following activities:  Hearing? 0 0  Vision? 0 0  Difficulty concentrating or making decisions? 1 1  Walking or climbing stairs? 0 0  Dressing or bathing? 0 0  Doing errands, shopping? 0 0  Preparing Food and eating ? N N  Using the Toilet? N N  In the past six months, have you accidently leaked urine? Y Y  Do you have problems with loss of bowel control? N N  Managing your Medications? N N  Managing your Finances? N N  Housekeeping or managing your Housekeeping? Y Y  Comment has to do a little at a time     Patient Care Team: Marjie Skiff, NP as PCP - General (Nurse Practitioner) Bridgett Larsson, LCSW as Social Worker (Licensed Clinical Social Worker) Allegra Lai, Loel Dubonnet, MD as Consulting Physician (Gastroenterology) [facility], [facility] (Optometry)  Indicate any recent Medical Services you may have received from other than [facility] providers in the past year (date may be approximate).     Assessment:   This is a routine wellness examination for Aima.  Hearing/Vision screen Hearing Screening - Comments:: Denies hearing loss Vision Screening - Comments:: Gets eye exams, Marthenia Rolling, [place], [place]   Goals Addressed             This Visit's Progress    Patient Stated  Be able to focus better       Depression Screen     04/16/2023    9:42 AM  12/27/2022   11:10 AM 11/02/2022   10:55 AM 09/21/2022   10:13 AM 08/02/2022   10:29 AM 06/21/2022   10:44 AM 04/10/2022   11:36 AM  PHQ 2/9 Scores  PHQ - 2 Score 4 2 2 2 4 6 1   PHQ- 9 Score 9 9 8 10 12 17 3     Fall Risk     04/16/2023    9:48 AM 04/10/2023   11:04 AM 11/02/2022   10:55 AM 06/21/2022   10:44 AM 04/10/2022   11:38 AM  Fall Risk   Falls in the past year? 0 0 0 0 1  Number falls in past yr: 0 0 0 0 0  Injury with Fall? 0 0 0 0 0  Risk for fall due to : No Fall Risks  No Fall Risks No Fall Risks History of fall(s)  Follow up Falls prevention discussed;Falls evaluation completed  Falls evaluation completed Falls evaluation completed Falls evaluation completed;Falls prevention discussed    MEDICARE RISK AT HOME:  Medicare Risk at Home Any stairs in or around the home?: Yes If so, are there any without handrails?: No Home free of loose throw rugs in walkways, pet beds, electrical cords, etc?: Yes Adequate lighting in your home to reduce risk of falls?: Yes Life alert?: No Use of a cane, walker or w/c?: No Grab bars in the bathroom?: Yes Shower chair or bench in shower?: Yes Elevated toilet seat or a handicapped toilet?: No  TIMED UP AND GO:  Was the test performed?  No  Cognitive Function: 6CIT completed        04/16/2023    9:48 AM 04/10/2022   11:43 AM 03/07/2020   10:39 AM 02/27/2018   10:47 AM 02/22/2017   10:23 AM  6CIT Screen  What Year? 0 points 0 points 0 points 0 points 0 points  What month? 0 points 0 points 0 points 0 points 0 points  What time? 0 points 0 points 0 points 0 points 0 points  Count back from 20 0 points 0 points 0 points 0 points 0 points  Months in reverse 0 points 0 points 2 points 0 points 0 points  Repeat phrase 0 points 0 points 4 points 0 points 0 points  Total Score 0 points 0 points 6 points 0 points 0 points    Immunizations Immunization History  Administered Date(s) Administered   Fluad Quad(high Dose 65+) 12/04/2021   Fluad  Trivalent(High Dose 65+) 11/02/2022   Influenza Inj Mdck Quad Pf 11/28/2017   Influenza, High Dose Seasonal PF 11/09/2016, 11/23/2020   Influenza-Unspecified 11/22/2014, 12/12/2015, 01/07/2017, 11/28/2017, 12/16/2018, 12/04/2021   PFIZER(Purple Top)SARS-COV-2 Vaccination 04/01/2019, 04/22/2019   Pfizer Covid-19 Vaccine Bivalent Booster 80yrs & up 11/03/2020   Pfizer(Comirnaty)Fall Seasonal Vaccine 12 years and older 11/05/2022   Pneumococcal Conjugate-13 02/22/2017   Pneumococcal Polysaccharide-23 02/27/2018   Tdap 10/07/2015   Zoster Recombinant(Shingrix) 10/22/2021, 07/06/2022   Zoster, Live 03/06/2013    Screening Tests Health Maintenance  Topic Date Due   MAMMOGRAM  02/18/2024   Medicare Annual Wellness (AWV)  04/15/2024   DEXA SCAN  02/17/2025   DTaP/Tdap/Td (2 - Td or Tdap) 10/06/2025   Colonoscopy  05/25/2027   Pneumonia Vaccine 53+ Years old  Completed   INFLUENZA VACCINE  Completed   COVID-19 Vaccine  Completed   Hepatitis C Screening  Completed  Zoster Vaccines- Shingrix  Completed   HPV VACCINES  Aged Out    Health Maintenance  There are no preventive care reminders to display for this patient. Health Maintenance Items Addressed: See Nurse Notes  Additional Screening:  Vision Screening: Recommended annual ophthalmology exams for early detection of glaucoma and other disorders of the eye.  Dental Screening: Recommended annual dental exams for proper oral hygiene  Community Resource Referral / Chronic Care Management: CRR required this visit?  No   CCM required this visit?  No     Plan:     I have personally reviewed and noted the following in the patient's chart:   Medical and social history Use of alcohol, tobacco or illicit drugs  Current medications and supplements including opioid prescriptions. Patient is not currently taking opioid prescriptions. Functional ability and status Nutritional status Physical activity Advanced directives List of  other physicians Hospitalizations, surgeries, and ER visits in previous 12 months Vitals Screenings to include cognitive, depression, and falls Referrals and appointments  In addition, I have reviewed and discussed with patient certain preventive protocols, quality metrics, and best practice recommendations. A written personalized care plan for preventive services as well as general preventive health recommendations were provided to patient.     Tora Kindred, CMA   04/16/2023   After Visit Summary: (MyChart) Due to this being a telephonic visit, the after visit summary with patients personalized plan was offered to patient via MyChart   Notes: Nothing significant to report at this time.

## 2023-04-26 ENCOUNTER — Ambulatory Visit: Payer: Medicare Other | Admitting: Clinical

## 2023-04-26 DIAGNOSIS — F331 Major depressive disorder, recurrent, moderate: Secondary | ICD-10-CM | POA: Diagnosis not present

## 2023-04-26 NOTE — Progress Notes (Signed)
   [name], LCSW

## 2023-04-26 NOTE — Progress Notes (Signed)
 [facility] Counselor/Therapist Progress Note  Patient ID: Marie Perry, MRN: [id number],    Date: 04/26/2023  Time Spent: 9:35am - 10:28am : 53 minutes  Treatment Type: Individual Therapy  Reported Symptoms: sadness, tearful, worry  Mental Status Exam: Appearance:  Neat and Well Groomed     Behavior: Appropriate  Motor: Normal  Speech/Language:  Clear and Coherent and Normal Rate  Affect: Tearful  Mood: sad  Thought process: normal  Thought content:   WNL  Sensory/Perceptual disturbances:   WNL  Orientation: oriented to person, place, and situation  Attention: Good  Concentration: Good  Memory: WNL  Fund of knowledge:  Good  Insight:   Good  Judgment:  Good  Impulse Control: Good   Risk Assessment: Danger to Self:  No Patient denied current suicidal ideation  Self-injurious Behavior: No Danger to Others: No Patient denied current homicidal ideation Duty to Warn:no Physical Aggression / Violence:No  Access to Firearms a concern: No  Gang Involvement:No   Subjective: Patient stated, "busy" and reported patient's brother and sister in law have been visiting in response to events since last session.  Patient reported she became tearful when thinking about her mother during a recent meeting. Patient stated, "It takes me a day to regroup emotionally and physically" and stated, "I got to feeling better Sunday afternoon". Patient reported patient's brother recently asked patient if she felt guilty about the care she provided for her mother and patient reported her brother was reassuring patient that patient's siblings felt patient provided quality care for her mother. Patient reported patient's brother stated, "you did the best that you could with what you know". Patient stated, "just thinking about mom" in response to tearfulness during today's session. Patient stated, "the wish I could've stuff" in response to thoughts associated with sadness. Patient stated,   "living a part of the life that she (mother) lived with my dad and all", "my personality", "overanxious", "thinking to much about something" as triggers for self doubt. Patient reported a sense of responsibility for her mother prior to patient's mother developing symptoms of dementia. Patient stated, "I realized early on what the situation was and what she (mother) was having to live like", "I felt like I should be a shield between her (mother) and my dad".  Patient reported situations have occurred since last session and stated, "I was thinking of the worst case scenario".  Interventions: Cognitive Behavioral Therapy. Clinician conducted session via caregility video from clinician's home office. Patient provided verbal consent to proceed with telehealth session and is aware of limitations of telephone or video visits. Patient participated in session from patient's home. Reviewed events since last session. Explored and identified triggers for recent feelings of sadness. Assisted patient in identifying thoughts associated with feelings of sadness. Reviewed challenging thoughts/cognitive distortions/unhelpful thinking styles. Explored triggers for self doubt. Examined patient's feelings of responsibility for patient's mother prior to symptoms of dementia. Reviewed patient's thought record. Clinician requested for homework patient continue thought record and practice challenging cognitive distortions   Collaboration of Care: Other not required at this time   Diagnosis:  Major depressive disorder, recurrent episode, moderate with anxious distress     Plan: Patient is to utilize [employer] Therapy, thought re-framing, behavioral activation, and coping strategies to decrease symptoms associated with their diagnosis. Frequency: bi-weekly  Modality: individual      Long-term goal:   Reduce overall level, frequency, and intensity of the feelings of depression as evidenced by decreased crying,  difficulty falling  asleep and staying asleep, changes in appetite, lack of energy, fatigue, lack of motivation, difficulty focusing, and loss of interest from 7 days/week to 2 to 3 days/week per patient report for at least 3 consecutive months. Target Date: 09/04/2023  Progress: progressing    Short-term goal:  Begin a healthy grieving process around the loss of patient's mother Target Date: 09/04/2023  Progress: progressing    Decrease negative thoughts associated with patient's mother's death Target Date: 2023/09/04  Progress: progressing    Identify, challenge, and replace negative thought patterns that contribute to feelings of depression with positive thoughts and beliefs per patient's report Target Date: Sep 04, 2023  Progress: progressing    Increase activities that patient enjoys to support increased motivation and mood, such as, socializing with friends weekly, completing puzzles, playing games. Target Date: 09-04-23  Progress: progressing    [name], LCSW

## 2023-05-10 ENCOUNTER — Ambulatory Visit: Payer: Medicare Other | Admitting: Clinical

## 2023-05-10 DIAGNOSIS — F419 Anxiety disorder, unspecified: Secondary | ICD-10-CM | POA: Diagnosis not present

## 2023-05-10 DIAGNOSIS — F331 Major depressive disorder, recurrent, moderate: Secondary | ICD-10-CM

## 2023-05-10 NOTE — Progress Notes (Signed)
 [facility] Counselor/Therapist Progress Note  Patient ID: Marie Perry, MRN: [id number],    Date: 05/10/2023  Time Spent: 9:34am - 10:31am : 57 minutes   Treatment Type: Individual Therapy  Reported Symptoms: ruminating thoughts  Mental Status Exam: Appearance:  Neat and Well Groomed     Behavior: Appropriate  Motor: Normal  Speech/Language:  Clear and Coherent and Normal Rate  Affect: Appropriate  Mood: Patient reported feeling "stressed"  Thought process: tangential  Thought content:   Tangential  Sensory/Perceptual disturbances:   WNL  Orientation: oriented to person, place, situation, and day of week  Attention: Good  Concentration: Good  Memory: WNL  Fund of knowledge:  Good  Insight:   Good  Judgment:  Good  Impulse Control: Good   Risk Assessment: Danger to Self:  No Patient denied current suicidal ideation  Self-injurious Behavior: No Danger to Others: No Patient denied current homicidal ideation Duty to Warn:no Physical Aggression / Violence:No  Access to Firearms a concern: No  Gang Involvement:No   Subjective: Patient stated, "this has been over half the week this situation with my computer". Patient reported difficulty logging into patient's [employer] accounts. Patient stated, "now I've got more stress, when I tried to cut on my desktop my computer and it wouldn't cut on". Patient stated, "not real well" in response to implementation of coping strategies. Patient stated, "I'm telling myself I've tried to do everything I knew to do on my side". Patient reported experiencing "what if" thoughts in response to recent stressors. Patient reported patient traveled to [place] to drive patient's brother/sister in law home and visit with family. Patient reported during patient's visit her brother became ill and reported concern for her brother. Patient stated, "I was thinking what if I can't get in there (accounts on computer)" and reported concern  patient would not be able to perform job related tasks. Patient reported she reviewed the steps patient took to resolve computer issues. Patient reported feeling anxious in response to stressors. Patient reported patient's sister will be assisting patient tomorrow with the computer issues. Patient reported feeling "stressed" today.   Interventions: Cognitive Behavioral Therapy. Clinician conducted session via caregility video from clinician's home office. Patient provided verbal consent to proceed with telehealth session and is aware of limitations of telephone or video visits. Patient participated in session from patient's home. Clinician attempted to assist patient in logging into caregility using patient's alternative device due patient experiencing difficulty hearing clinician while using caregility on patient's phone. Reviewed events since last session and assessed for changes. Discussed current stressors. Explored and identified thoughts/feelings triggered by recent stressors. Examined coping strategies patient utilized in response to stressors. Assisted patient in examining "what if" thoughts patient experienced in response to stressors. Challenged statements/thoughts to assist patient in reframing computer situation. Clinician requested for homework patient continue thought record and practice challenging cognitive distortions   Collaboration of Care: Other not required at this time   Diagnosis:  Major depressive disorder, recurrent episode, moderate with anxious distress     Plan: Patient is to utilize [employer] Therapy, thought re-framing, behavioral activation, and coping strategies to decrease symptoms associated with their diagnosis. Frequency: bi-weekly  Modality: individual      Long-term goal:   Reduce overall level, frequency, and intensity of the feelings of depression as evidenced by decreased crying, difficulty falling asleep and staying asleep, changes in appetite, lack  of energy, fatigue, lack of motivation, difficulty focusing, and loss of interest from 7 days/week to 2  to 3 days/week per patient report for at least 3 consecutive months. Target Date: 2023/08/19  Progress: progressing    Short-term goal:  Begin a healthy grieving process around the loss of patient's mother Target Date: 19-Aug-2023  Progress: progressing    Decrease negative thoughts associated with patient's mother's death Target Date: 08/19/23  Progress: progressing    Identify, challenge, and replace negative thought patterns that contribute to feelings of depression with positive thoughts and beliefs per patient's report Target Date: 08-19-2023  Progress: progressing    Increase activities that patient enjoys to support increased motivation and mood, such as, socializing with friends weekly, completing puzzles, playing games. Target Date: 2023-08-19  Progress: progressing    [name], LCSW

## 2023-05-10 NOTE — Progress Notes (Signed)
   Marie Barthel, LCSW

## 2023-05-12 ENCOUNTER — Other Ambulatory Visit: Payer: Self-pay | Admitting: Nurse Practitioner

## 2023-05-13 ENCOUNTER — Other Ambulatory Visit: Payer: Self-pay | Admitting: Nurse Practitioner

## 2023-05-13 DIAGNOSIS — F4320 Adjustment disorder, unspecified: Secondary | ICD-10-CM

## 2023-05-13 DIAGNOSIS — F332 Major depressive disorder, recurrent severe without psychotic features: Secondary | ICD-10-CM

## 2023-05-14 ENCOUNTER — Telehealth: Payer: Self-pay | Admitting: Nurse Practitioner

## 2023-05-14 NOTE — Telephone Encounter (Signed)
 Rx 06/21/22 #180 4RF- too soon Requested Prescriptions  Pending Prescriptions Disp Refills   busPIRone (BUSPAR) 10 MG tablet [Pharmacy Med Name: BUSPIRONE HCL 10 MG TABLET] 180 tablet 4    Sig: TAKE 1 TABLET BY MOUTH TWICE A DAY     Psychiatry: Anxiolytics/Hypnotics - Non-controlled Passed - 05/14/2023  2:44 PM      Passed - Valid encounter within last 12 months    Recent Outpatient Visits   None     Future Appointments             In 1 month Cannady, Dorie Rank, NP [facility]

## 2023-05-14 NOTE — Telephone Encounter (Signed)
 [facility] called and spoke to [place], [facility] about the refill(s) buspirone requested. Advised it was sent on 06/21/22 #180/4 refill(s). Advised a refill request came through on 05/12/23 and it was refused today noting too soon for refill. She says the Rx was discontinued per patient request at some point because she wasn't taking this medication, so a new Rx is needed if the provider wants her to continue taking.  Copied from CRM [id number]. Topic: Clinical - Prescription Issue >> May 14, 2023  4:35 PM Ja-Kwan M wrote: Reason for CRM: Patient stated that she was told that the refill request for busPIRone (BUSPAR) 10 MG tablet was denied. The Rx was sent to  [facility] on 06/21/22 for 180 tablets with 4 refills. Patient requests call back to advise. Call back# [phone]

## 2023-05-15 MED ORDER — BUSPIRONE HCL 10 MG PO TABS: 10.0000 mg | ORAL_TABLET | Freq: Two times a day (BID) | ORAL | 4 refills | Status: DC

## 2023-05-15 NOTE — Telephone Encounter (Signed)
 Routing to provider to advise.

## 2023-05-24 ENCOUNTER — Ambulatory Visit: Admitting: Clinical

## 2023-05-24 DIAGNOSIS — F331 Major depressive disorder, recurrent, moderate: Secondary | ICD-10-CM

## 2023-05-24 DIAGNOSIS — F419 Anxiety disorder, unspecified: Secondary | ICD-10-CM | POA: Diagnosis not present

## 2023-05-24 NOTE — Progress Notes (Signed)
   [name], LCSW

## 2023-05-24 NOTE — Progress Notes (Signed)
 [facility] Counselor/Therapist Progress Note  Patient ID: Marie Perry, MRN: [id number],    Date: 05/24/2023  Time Spent: 9:35am - 10:33am : 58 minutes   Treatment Type: Individual Therapy  Reported Symptoms: patient reported feeling weak and tired today  Mental Status Exam: Appearance:  Neat and Well Groomed     Behavior: Appropriate  Motor: Normal  Speech/Language:  Clear and Coherent and Normal Rate  Affect: Appropriate  Mood: anxious  Thought process: normal  Thought content:   WNL  Sensory/Perceptual disturbances:   WNL  Orientation: oriented to person, place, and situation  Attention: Good  Concentration: Good  Memory: WNL  Fund of knowledge:  Good  Insight:   Good  Judgment:  Good  Impulse Control: Good   Risk Assessment: Danger to Self:  No Patient denied current suicidal ideation  Self-injurious Behavior: No Danger to Others: No Patient denied current homicidal ideation Duty to Warn:no Physical Aggression / Violence:No  Access to Firearms a concern: No  Gang Involvement:No   Subjective: Patient reported she was not feeling well Wednesday or Thursday and reported feeling weak and tired today. Patient reported patient's sister was able to set up patient's office phone and the business's IT staff was able to repair patient's computer. Patient stated, "I was finally able to get through it, it went well" in reference to computer difficulties. Patient stated, "I felt better after getting some of that off my mind" in reference to computer difficulties. Patient reported she practiced utilizing the challenging questions in response to negative thoughts associated with computer difficulties. Patient reported she has been experiencing losing patient's train of thought and reported forgetting a thought and recalling the thought at a later time. Patient reported she asks family members "remind me if I've told you this" prior to conversations. Patient stated,  "I can get distracted too easy". Patient stated, "how do I tell the normal aging process". Patient reported she forgets what she wants to say at times during conversations. Patient stated, "It (cognitive changes) makes me anxious but I know my family is watching me for signs". Patient stated, "I notice I don't seem to think through things fast". Patient reported she has been experiencing tremors in patient's hands. Patient stated, "I didn't realize how much it was bothering more" in reference to tremor in hands. Patient reported feeling anxious today regarding patient's taxes.   Interventions: Cognitive Behavioral Therapy.  Clinician conducted session via caregility video for video and telephone for audio from clinician's home office due to patient not being able to utilize audio through caregility. Patient provided verbal consent to proceed with telehealth session and is aware of limitations of telephone or video visits. Patient participated in session from patient's home. Reviewed events since last session and assessed for changes. Reviewed status of recent stressors and patient's implementation of challenging negative thoughts associated with stressors. Reviewed patient's thought record. Discussed patient's concerns as it relates to cognitive changes and tremor in hands. Provided psycho education regarding normal age related cognitive changes. Discussed patient following up with patient's PCP to review patient's concerns and symptoms. Clinician requested for homework patient continue thought record and practice challenging cognitive distortions  Collaboration of Care: Other not required at this time   Diagnosis:  Major depressive disorder, recurrent episode, moderate with anxious distress     Plan: Patient is to utilize [employer] Therapy, thought re-framing, behavioral activation, and coping strategies to decrease symptoms associated with their diagnosis. Frequency: bi-weekly  Modality:  individual  Long-term goal:   Reduce overall level, frequency, and intensity of the feelings of depression as evidenced by decreased crying, difficulty falling asleep and staying asleep, changes in appetite, lack of energy, fatigue, lack of motivation, difficulty focusing, and loss of interest from 7 days/week to 2 to 3 days/week per patient report for at least 3 consecutive months. Target Date: 01-Sep-2023  Progress: progressing    Short-term goal:  Begin a healthy grieving process around the loss of patient's mother Target Date: September 01, 2023  Progress: progressing    Decrease negative thoughts associated with patient's mother's death Target Date: 09-01-2023  Progress: progressing    Identify, challenge, and replace negative thought patterns that contribute to feelings of depression with positive thoughts and beliefs per patient's report Target Date: 01-Sep-2023  Progress: progressing    Increase activities that patient enjoys to support increased motivation and mood, such as, socializing with friends weekly, completing puzzles, playing games. Target Date: 01-Sep-2023  Progress: progressing     [name], LCSW

## 2023-06-06 ENCOUNTER — Ambulatory Visit (INDEPENDENT_AMBULATORY_CARE_PROVIDER_SITE_OTHER): Admitting: Clinical

## 2023-06-06 DIAGNOSIS — F331 Major depressive disorder, recurrent, moderate: Secondary | ICD-10-CM | POA: Diagnosis not present

## 2023-06-06 DIAGNOSIS — F418 Other specified anxiety disorders: Secondary | ICD-10-CM

## 2023-06-06 NOTE — Progress Notes (Signed)
   [name], LCSW

## 2023-06-06 NOTE — Progress Notes (Signed)
  Behavioral Health Counselor/Therapist Progress Note  Patient ID: Marie Perry, MRN: [id number],    Date: 06/06/2023  Time Spent: 10:35am - 11:26am : 51 minutes   Treatment Type: Individual Therapy  Reported Symptoms: Patient reported recently feeling frustrated  Mental Status Exam: Appearance:  Neat and Well Groomed     Behavior: Appropriate  Motor: Normal  Speech/Language:  Clear and Coherent and Normal Rate  Affect: Tearful when discussing patient's experience  Mood: normal "I think it's fair it's good"  Thought process: normal  Thought content:   WNL  Sensory/Perceptual disturbances:   WNL  Orientation: oriented to person, place, and situation  Attention: Good  Concentration: Good  Memory: WNL  Fund of knowledge:  Good  Insight:   Good  Judgment:  Good  Impulse Control: Good   Risk Assessment: Danger to Self:  No Patient denied current suicidal ideation  Self-injurious Behavior: No Danger to Others: No Patient denied current homicidal ideation Duty to Warn:no Physical Aggression / Violence:No  Access to Firearms a concern: No  Gang Involvement:No   Subjective: Patient stated, "I'm physically doing ok, I'm just exasperated, frustrated".  Patient reported the process of filing taxes and issues with patient's work computer are triggers for feeling exasperated and frustrated. Patient reported recent stressors have negatively impacted patient's mood. Patient reported she has been providing support to a friend who is caring for her mother with a diagnosis of dementia. Patient reported patient's friend's mother passed away on 08-16-23. Patient reported patient reviewed patient's experience with patient's mother in response to patient's friend's inquiries. Patient stated, "the whole process was bringing all my past history back". Patient stated, "so I felt kind of down about that". Patient stated, "I was thinking more less this is what happened, but I'm doing this to  help somebody else" in reference to patient's experience supporting friend. Patient stated, "I didn't get upset or anything". Patient reported she has several friends who have lost a family member with a diagnosis of dementia. Patient stated, "I felt good having someone else to talk about dementia". Patient stated, "I had to kind of tell myself just answer what she's (friend) asking for and give her that information". Patient stated, "it hurt" in reference to sharing patient's experience as caregiver for patient's mother and mother's passing. Patient stated, "It reminded me of how I felt when I was trying to get help for mom". Patient reported she does not want to attend funerals in the future due to patient's experience with patient's mother's death.   Interventions: Cognitive Behavioral Therapy. Clinician conducted session via caregility video from clinician's office at [facility]. Patient provided verbal consent to proceed with telehealth session and is aware of limitations of telephone or video visits. Patient participated in session from patient's home. Reviewed events since last session and assessed for changes. Explored and identified triggers for feelings of exasperation and frustration. Discussed patient's experience providing support to friend and processed patient's thoughts/feelings regarding experience. Clinician requested for homework patient continue thought record and practice challenging cognitive distortions.    Collaboration of Care: Other not required at this time   Diagnosis:  Major depressive disorder, recurrent episode, moderate with anxious distress     Plan: Patient is to utilize [employer] Therapy, thought re-framing, behavioral activation, and coping strategies to decrease symptoms associated with their diagnosis. Frequency: bi-weekly  Modality: individual      Long-term goal:   Reduce overall level, frequency, and intensity of the feelings of depression  as evidenced by decreased crying, difficulty falling asleep and staying asleep, changes in appetite, lack of energy, fatigue, lack of motivation, difficulty focusing, and loss of interest from 7 days/week to 2 to 3 days/week per patient report for at least 3 consecutive months. Target Date: September 02, 2023  Progress: progressing    Short-term goal:  Begin a healthy grieving process around the loss of patient's mother Target Date: 09-02-23  Progress: progressing    Decrease negative thoughts associated with patient's mother's death Target Date: 2023/09/02  Progress: progressing    Identify, challenge, and replace negative thought patterns that contribute to feelings of depression with positive thoughts and beliefs per patient's report Target Date: 2023-09-02  Progress: progressing    Increase activities that patient enjoys to support increased motivation and mood, such as, socializing with friends weekly, completing puzzles, playing games. Target Date: Sep 02, 2023  Progress: progressing       Burlene Carpen, LCSW

## 2023-06-13 DIAGNOSIS — Z961 Presence of intraocular lens: Secondary | ICD-10-CM | POA: Diagnosis not present

## 2023-06-13 DIAGNOSIS — H01024 Squamous blepharitis left upper eyelid: Secondary | ICD-10-CM | POA: Diagnosis not present

## 2023-06-13 DIAGNOSIS — H0288A Meibomian gland dysfunction right eye, upper and lower eyelids: Secondary | ICD-10-CM | POA: Diagnosis not present

## 2023-06-13 DIAGNOSIS — H1045 Other chronic allergic conjunctivitis: Secondary | ICD-10-CM | POA: Diagnosis not present

## 2023-06-13 DIAGNOSIS — H01026 Squamous blepharitis left eye, unspecified eyelid: Secondary | ICD-10-CM | POA: Diagnosis not present

## 2023-06-13 DIAGNOSIS — H0288B Meibomian gland dysfunction left eye, upper and lower eyelids: Secondary | ICD-10-CM | POA: Diagnosis not present

## 2023-06-13 DIAGNOSIS — H01021 Squamous blepharitis right upper eyelid: Secondary | ICD-10-CM | POA: Diagnosis not present

## 2023-06-13 DIAGNOSIS — H04123 Dry eye syndrome of bilateral lacrimal glands: Secondary | ICD-10-CM | POA: Diagnosis not present

## 2023-06-13 DIAGNOSIS — H01023 Squamous blepharitis right eye, unspecified eyelid: Secondary | ICD-10-CM | POA: Diagnosis not present

## 2023-06-13 DIAGNOSIS — H26492 Other secondary cataract, left eye: Secondary | ICD-10-CM | POA: Diagnosis not present

## 2023-06-13 DIAGNOSIS — H01029 Squamous blepharitis unspecified eye, unspecified eyelid: Secondary | ICD-10-CM | POA: Diagnosis not present

## 2023-06-28 ENCOUNTER — Encounter: Payer: Self-pay | Admitting: Nurse Practitioner

## 2023-07-04 ENCOUNTER — Ambulatory Visit (INDEPENDENT_AMBULATORY_CARE_PROVIDER_SITE_OTHER): Admitting: Clinical

## 2023-07-04 DIAGNOSIS — F419 Anxiety disorder, unspecified: Secondary | ICD-10-CM | POA: Diagnosis not present

## 2023-07-04 DIAGNOSIS — F331 Major depressive disorder, recurrent, moderate: Secondary | ICD-10-CM | POA: Diagnosis not present

## 2023-07-04 NOTE — Progress Notes (Signed)
 [facility] Counselor/Therapist Progress Note  Patient ID: Marie Perry, MRN: [id number],    Date: 07/04/2023  Time Spent: 9:35am - 10:28am : 53 minutes  Treatment Type: Individual Therapy  Reported Symptoms: recent feelings of sadness  Mental Status Exam: Appearance:  Neat and Well Groomed     Behavior: Appropriate  Motor: Normal  Speech/Language:  Clear and Coherent and Normal Rate  Affect: Appropriate  Mood: normal  Thought process: normal  Thought content:   WNL  Sensory/Perceptual disturbances:   WNL  Orientation: oriented to person, place, time/date, and situation  Attention: Good  Concentration: Good  Memory: WNL  Fund of knowledge:  Good  Insight:   Good  Judgment:  Good  Impulse Control: Good   Risk Assessment: Danger to Self:  No Self-injurious Behavior: No Danger to Others: No Duty to Warn:no Physical Aggression / Violence:No  Access to Firearms a concern: No  Gang Involvement:No   Subjective: Patient reported she has experienced issues with her vehicle not working properly and reported vehicle repairs have been a stressor. Patient stated, "the past couple of weeks have been interesting". Patient reported she was locked out of her home for several hours since last session. Patient reported during a conversation with an individual assisting patient in regaining entry to patient's home, patient's mother was mentioned and patient reported she started crying in response. Patient reported she tried to identify triggers for crying/sadness during the recent conversation. Patient reported she was tired from recent travel and reported sadness regarding change in relationship with friend who was recently married. Patient stated, "I was really frustrated with myself that it happened" in reference to being locked out of patient's home. Patient reported feeling helpless in response to being locked out of patient's home. Patient stated, "thinking about me  being alone" and reported experiencing thoughts of being able to maintain patient's independence as patient ages in response to being locked out of patient's home. Patient reported concerns, worry, anxiety related to potential changes in memory in the future. Patient inquired about early stage of Alzheimer's Disease.   Interventions: Cognitive Behavioral Therapy. Clinician conducted session via caregility video from clinician's office at [facility]. Patient provided verbal consent to proceed with telehealth session and is aware of limitations of telephone or video visits. Patient participated in session from patient's home. Reviewed events since last session and assessed for changes. Discussed recent stressors. Explored and identified triggers for recent feelings of sadness. Explored and identified thoughts triggered by being locked out of patient's home. Challenged statements to assist patient in reframing situation regarding being locked out of patient's home. Provided psycho education related to early stage/warning signs of Alzheimer's Disease. Clinician requested for homework patient continue thought record and practice challenging cognitive distortions.    Collaboration of Care: Other not required at this time   Diagnosis:  Major depressive disorder, recurrent episode, moderate with anxious distress     Plan: Patient is to utilize [employer] Therapy, thought re-framing, behavioral activation, and coping strategies to decrease symptoms associated with their diagnosis. Frequency: bi-weekly  Modality: individual      Long-term goal:   Reduce overall level, frequency, and intensity of the feelings of depression as evidenced by decreased crying, difficulty falling asleep and staying asleep, changes in appetite, lack of energy, fatigue, lack of motivation, difficulty focusing, and loss of interest from 7 days/week to 2 to 3 days/week per patient report for at least 3 consecutive  months. Target Date: 08/17/23  Progress: progressing  Short-term goal:  Begin a healthy grieving process around the loss of patient's mother Target Date: 08/26/23  Progress: progressing    Decrease negative thoughts associated with patient's mother's death Target Date: 08/26/23  Progress: progressing    Identify, challenge, and replace negative thought patterns that contribute to feelings of depression with positive thoughts and beliefs per patient's report Target Date: Aug 26, 2023  Progress: progressing    Increase activities that patient enjoys to support increased motivation and mood, such as, socializing with friends weekly, completing puzzles, playing games. Target Date: 08/26/2023  Progress: progressing    Marie Carpen, LCSW

## 2023-07-04 NOTE — Progress Notes (Signed)
   [name], LCSW

## 2023-07-14 NOTE — Patient Instructions (Signed)
 Be Involved in Caring For Your Health:  Taking Medications When medications are taken as directed, they can greatly improve your health. But if they are not taken as prescribed, they may not work. In some cases, not taking them correctly can be harmful. To help ensure your treatment remains effective and safe, understand your medications and how to take them. Bring your medications to each visit for review by your provider.  Your lab results, notes, and after visit summary will be available on My Chart. We strongly encourage you to use this feature. If lab results are abnormal the clinic will contact you with the appropriate steps. If the clinic does not contact you assume the results are satisfactory. You can always view your results on My Chart. If you have questions regarding your health or results, please contact the clinic during office hours. You can also ask questions on My Chart.  We at [facility] are grateful that you chose [place] to provide your care. We strive to provide evidence-based and compassionate care and are always looking for feedback. If you get a survey from the clinic please complete this so we can hear your opinions.  Managing Depression, Adult Depression is a mental health condition that affects your thoughts, feelings, and actions. Being diagnosed with depression can bring you relief if you did not know why you have felt or behaved a certain way. It could also leave you feeling overwhelmed. Finding ways to manage your symptoms can help you feel more positive about your future. How to manage lifestyle changes Being depressed is difficult. Depression can increase the level of everyday stress. Stress can make depression symptoms worse. You may believe your symptoms cannot be managed or will never improve. However, there are many things you can try to help manage your symptoms. There is hope. Managing stress  Stress is your body's reaction to life changes and events,  both good and bad. Stress can add to your feelings of depression. Learning to manage your stress can help lessen your feelings of depression. Try some of the following approaches to reducing your stress (stress reduction techniques): Listen to music that you enjoy and that inspires you. Try using a meditation app or take a meditation class. Develop a practice that helps you connect with your spiritual self. Walk in nature, pray, or go to a place of worship. Practice deep breathing. To do this, inhale slowly through your nose. Pause at the top of your inhale for a few seconds and then exhale slowly, letting yourself relax. Repeat this three or four times. Practice yoga to help relax and work your muscles. Choose a stress reduction technique that works for you. These techniques take time and practice to develop. Set aside 5-15 minutes a day to do them. Therapists can offer training in these techniques. Do these things to help manage stress: Keep a journal. Know your limits. Set healthy boundaries for yourself and others, such as saying "no" when you think something is too much. Pay attention to how you react to certain situations. You may not be able to control everything, but you can change your reaction. Add humor to your life by watching funny movies or shows. Make time for activities that you enjoy and that relax you. Spend less time using electronics, especially at night before bed. The light from screens can make your brain think it is time to get up rather than go to bed.  Medicines Medicines, such as antidepressants, are often a part of  treatment for depression. Talk with your pharmacist or health care provider about all the medicines, supplements, and herbal products that you take, their possible side effects, and what medicines and other products are safe to take together. Make sure to report any side effects you may have to your health care provider. Relationships Your health care  provider may suggest family therapy, couples therapy, or individual therapy as part of your treatment. How to recognize changes Everyone responds differently to treatment for depression. As you recover from depression, you may start to: Have more interest in doing activities. Feel more hopeful. Have more energy. Eat a more regular amount of food. Have better mental focus. It is important to recognize if your depression is not getting better or is getting worse. The symptoms you had in the beginning may return, such as: Feeling tired. Eating too much or too little. Sleeping too much or too little. Feeling restless, agitated, or hopeless. Trouble focusing or making decisions. Having unexplained aches and pains. Feeling irritable, angry, or aggressive. If you or your family members notice these symptoms coming back, let your health care provider know right away. Follow these instructions at home: Activity Try to get some form of exercise each day, such as walking. Try yoga, mindfulness, or other stress reduction techniques. Participate in group activities if you are able. Lifestyle Get enough sleep. Cut down on or stop using caffeine, tobacco, alcohol, and any other harmful substances. Eat a healthy diet that includes plenty of vegetables, fruits, whole grains, low-fat dairy products, and lean protein. Limit foods that are high in solid fats, added sugar, or salt (sodium). General instructions Take over-the-counter and prescription medicines only as told by your health care provider. Keep all follow-up visits. It is important for your health care provider to check on your mood, behavior, and medicines. Your health care provider may need to make changes to your treatment. Where to find support Talking to others  Friends and family members can be sources of support and guidance. Talk to trusted friends or family members about your condition. Explain your symptoms and let them know that you  are working with a health care provider to treat your depression. Tell friends and family how they can help. Finances Find mental health providers that fit with your financial situation. Talk with your health care provider if you are worried about access to food, housing, or medicine. Call your insurance company to learn about your co-pays and prescription plan. Where to find more information You can find support in your area from: Anxiety and Depression Association of America (ADAA): adaa.org Mental Health America: mentalhealthamerica.net [employer] on Mental Illness: nami.org Contact a health care provider if: You stop taking your antidepressant medicines, and you have any of these symptoms: Nausea. Headache. Light-headedness. Chills and body aches. Not being able to sleep (insomnia). You or your friends and family think your depression is getting worse. Get help right away if: You have thoughts of hurting yourself or others. Get help right away if you feel like you may hurt yourself or others, or have thoughts about taking your own life. Go to your nearest emergency room or: Call 911. Call the National Suicide Prevention Lifeline at 315-804-2514 or 988. This is open 24 hours a day. Text the Crisis Text Line at 902-341-0291. This information is not intended to replace advice given to you by your health care provider. Make sure you discuss any questions you have with your health care provider. Document Revised: 06/06/2021 Document Reviewed:  06/06/2021 Elsevier Patient Education  2024 [employer].

## 2023-07-15 DIAGNOSIS — H26492 Other secondary cataract, left eye: Secondary | ICD-10-CM | POA: Diagnosis not present

## 2023-07-15 DIAGNOSIS — H40013 Open angle with borderline findings, low risk, bilateral: Secondary | ICD-10-CM | POA: Diagnosis not present

## 2023-07-15 DIAGNOSIS — H26493 Other secondary cataract, bilateral: Secondary | ICD-10-CM | POA: Diagnosis not present

## 2023-07-19 ENCOUNTER — Other Ambulatory Visit: Payer: Self-pay | Admitting: Nurse Practitioner

## 2023-07-19 ENCOUNTER — Encounter: Payer: Self-pay | Admitting: Nurse Practitioner

## 2023-07-19 ENCOUNTER — Encounter: Admitting: Nurse Practitioner

## 2023-07-19 ENCOUNTER — Ambulatory Visit (INDEPENDENT_AMBULATORY_CARE_PROVIDER_SITE_OTHER): Admitting: Nurse Practitioner

## 2023-07-19 VITALS — BP 116/63 | HR 81 | Temp 98.0°F | Ht 66.0 in | Wt 194.8 lb

## 2023-07-19 DIAGNOSIS — Z6831 Body mass index (BMI) 31.0-31.9, adult: Secondary | ICD-10-CM | POA: Diagnosis not present

## 2023-07-19 DIAGNOSIS — Z0000 Encounter for general adult medical examination without abnormal findings: Secondary | ICD-10-CM | POA: Diagnosis not present

## 2023-07-19 DIAGNOSIS — G4733 Obstructive sleep apnea (adult) (pediatric): Secondary | ICD-10-CM | POA: Diagnosis not present

## 2023-07-19 DIAGNOSIS — E6609 Other obesity due to excess calories: Secondary | ICD-10-CM | POA: Diagnosis not present

## 2023-07-19 DIAGNOSIS — E538 Deficiency of other specified B group vitamins: Secondary | ICD-10-CM

## 2023-07-19 DIAGNOSIS — M25512 Pain in left shoulder: Secondary | ICD-10-CM | POA: Insufficient documentation

## 2023-07-19 DIAGNOSIS — K219 Gastro-esophageal reflux disease without esophagitis: Secondary | ICD-10-CM | POA: Diagnosis not present

## 2023-07-19 DIAGNOSIS — E66811 Obesity, class 1: Secondary | ICD-10-CM

## 2023-07-19 DIAGNOSIS — F5104 Psychophysiologic insomnia: Secondary | ICD-10-CM

## 2023-07-19 DIAGNOSIS — M810 Age-related osteoporosis without current pathological fracture: Secondary | ICD-10-CM | POA: Diagnosis not present

## 2023-07-19 DIAGNOSIS — E782 Mixed hyperlipidemia: Secondary | ICD-10-CM

## 2023-07-19 DIAGNOSIS — F332 Major depressive disorder, recurrent severe without psychotic features: Secondary | ICD-10-CM | POA: Diagnosis not present

## 2023-07-19 MED ORDER — ROSUVASTATIN CALCIUM 10 MG PO TABS: 10.0000 mg | ORAL_TABLET | ORAL | Status: AC

## 2023-07-19 MED ORDER — DULOXETINE HCL 30 MG PO CPEP: ORAL_CAPSULE | ORAL | 0 refills | Status: DC

## 2023-07-19 MED ORDER — REXULTI 0.5 MG PO TABS: 1.0000 | ORAL_TABLET | Freq: Every day | ORAL | 2 refills | Status: DC

## 2023-07-19 MED ORDER — DENOSUMAB 60 MG/ML ~~LOC~~ SOSY: 60.0000 mg | PREFILLED_SYRINGE | SUBCUTANEOUS | 1 refills | Status: DC

## 2023-07-19 NOTE — Progress Notes (Signed)
 BP 116/63   Pulse 81   Temp 98 F (36.7 C) (Oral)   Ht 5\' 6"  (1.676 m)   Wt 194 lb 12.8 oz (88.4 kg)   LMP  (LMP Unknown)   SpO2 97%   BMI 31.44 kg/m    Subjective:    Patient ID: Marie Perry, female    DOB: 07/01/1950, 73 y.o.   MRN: [id number]  HPI: Mertha Clyatt is a 73 y.o. female presenting on 07/19/2023 for comprehensive medical examination. Current medical complaints include:none  She currently lives with: self Menopausal Symptoms: no   SHOULDER PAIN (LEFT) Cannot lift arm high, pain presents. Has been hurting for over one month. Has to help it along to raise it. Duration: months Involved shoulder: left Mechanism of injury: unknown Location: diffuse Onset:gradual Severity: 4/10 presently and 9/10 at worst Quality:  sharp, dull, aching Frequency: intermittent Radiation: no Aggravating factors: lifting and movement  Alleviating factors: rest  Status: fluctuating Treatments attempted: rest  Relief with NSAIDs?:  No NSAIDs Taken Weakness: no Numbness: no Decreased grip strength: no Redness: no Swelling: no Bruising: no Fevers: no   HYPERLIPIDEMIA Continues Rosuvastatin  10 MG weekly.  Hyperlipidemia status: good compliance Satisfied with current treatment?  yes Side effects:  no Medication compliance: good compliance Past cholesterol meds:  Supplements: none Aspirin:  no The 10-year ASCVD risk score (Arnett DK, et al., 2019) is: 9.5%   Values used to calculate the score:     Age: 34 years     Sex: Female     Is Non-Hispanic African American: No     Diabetic: No     Tobacco smoker: No     Systolic Blood Pressure: 116 mmHg     Is BP treated: No     HDL Cholesterol: 59 mg/dL     Total Cholesterol: 182 mg/dL Chest pain:  no Coronary artery disease:  no Family history CAD:  yes -- father with MIs Family history early CAD:  no   OSTEOPOROSIS DEXA on 02/18/23 was similar to previous at -2.9.  Tried Fosamax, but caused GI issues and GERD.   Could not obtain Prolia  -- currently taking Boniva  which has not offered much benefit.  Using supplements -- Vitamin D  and B12 deficiency -- taking supplement. Satisfied with current treatment?: yes Past osteoporosis medications/treatments: Fosamax Adequate calcium  & vitamin D : yes Intolerance to bisphosphonates:yes -- Fosamax Weight bearing exercises: yes  GERD Took Omeprazole  in past, but was discontinued due to osteoporosis. GERD control status: stable Satisfied with current treatment? no Heartburn frequency: few times a week Medication side effects: no  Medication compliance: fluctuating Previous GERD medications: TUMS and Prilosec Antacid use frequency:  occasional Dysphagia: no Odynophagia:  no Hematemesis: no Blood in stool: no EGD: no  DEPRESSION Has CPAP and uses 100% of the time.  Continues on Duloxetine , Wellbutrin , and Buspar . Does go to therapy.  Gets sweats from Duloxetine  and would like to try something else.   Mood status: stable Satisfied with current treatment?: yes Symptom severity: moderate  Duration of current treatment : chronic Side effects: no Medication compliance: good compliance Psychotherapy/counseling: in past Previous psychiatric medications: Cymbalta  and Buspar  Depressed mood: yes Anxious mood: yes Anhedonia: yes Significant weight loss or gain: no Insomnia: yes, difficulty falling asleep Fatigue: no Feelings of worthlessness or guilt: no Impaired concentration/indecisiveness: yes Suicidal ideations: no Hopelessness: no Crying spells: yes    07/19/2023    9:47 AM 04/16/2023    9:42 AM 12/27/2022   11:10 AM  11/02/2022   10:55 AM 09/21/2022   10:13 AM  Depression screen PHQ 2/9  Decreased Interest 1 3 1 1 1   Down, Depressed, Hopeless 2 1 1 1 1   PHQ - 2 Score 3 4 2 2 2   Altered sleeping 1 1 1 1 2   Tired, decreased energy 3 2 2 2 2   Change in appetite 1 1 1 1 2   Feeling bad or failure about yourself  1 0 1 1 1   Trouble concentrating 1 1 1  1 1   Moving slowly or fidgety/restless 0 0 1 0 0  Suicidal thoughts 0 0 0 0 0  PHQ-9 Score 10 9 9 8 10   Difficult doing work/chores Somewhat difficult Somewhat difficult Somewhat difficult Somewhat difficult Somewhat difficult      07/19/2023    9:44 AM 12/27/2022   11:11 AM 11/02/2022   10:55 AM 09/21/2022   10:13 AM  GAD 7 : Generalized Anxiety Score  Nervous, Anxious, on Edge 1 1 1  0  Control/stop worrying 1 1 1 1   Worry too much - different things 1 0 1 1  Trouble relaxing 0 1 0 1  Restless 0 0 0 0  Easily annoyed or irritable 1 0 0 0  Afraid - awful might happen 0 1 0 0  Total GAD 7 Score 4 4 3 3   Anxiety Difficulty Not difficult at all Not difficult at all Not difficult at all Not difficult at all        06/21/2022   10:44 AM 11/02/2022   10:55 AM 04/10/2023   11:04 AM 04/16/2023    9:48 AM 07/19/2023    9:47 AM  Fall Risk  Falls in the past year? 0 0 0 0 0  Was there an injury with Fall? 0 0 0 0 0  Fall Risk Category Calculator 0 0 0  0 0  Patient at Risk for Falls Due to No Fall Risks No Fall Risks  No Fall Risks No Fall Risks  Fall risk Follow up Falls evaluation completed Falls evaluation completed  Falls prevention discussed;Falls evaluation completed Falls evaluation completed     Patient-reported    Functional Status Survey: Is the patient deaf or have difficulty hearing?: No Does the patient have difficulty seeing, even when wearing glasses/contacts?: No Does the patient have difficulty concentrating, remembering, or making decisions?: No Does the patient have difficulty walking or climbing stairs?: No Does the patient have difficulty dressing or bathing?: No Does the patient have difficulty doing errands alone such as visiting a doctor's office or shopping?: No    Past Medical History:  Past Medical History:  Diagnosis Date   Allergy    Anxiety    Arthritis    Cataract 02/2016   Yearly eye exam w/Dr. 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12, had "brights  disease"   Depression    GERD (gastroesophageal reflux disease)    Hypertriglyceridemia    IBS (irritable bowel syndrome)    IFG (impaired fasting glucose)    Osteopenia    Sleep apnea    Vitamin D  deficiency     Surgical History:  Past Surgical History:  Procedure Laterality Date   bright's surgery     COLONOSCOPY N/A 09/17/2016   Procedure: COLONOSCOPY;  Surgeon: Deveron Fly, MD;  Location[facility] ENDOSCOPY;  Service: Endoscopy;  Laterality: N/A;   COLONOSCOPY WITH PROPOFOL  N/A 04/15/2017   Procedure: COLONOSCOPY WITH PROPOFOL ;  Surgeon: Deveron Fly, MD;  Location:  [facility] ENDOSCOPY;  Service: Endoscopy;  Laterality: N/A;   COLONOSCOPY WITH PROPOFOL  N/A 05/25/2022   Procedure: COLONOSCOPY WITH PROPOFOL ;  Surgeon: Selena Daily, MD;  Location: [facility] ENDOSCOPY;  Service: Gastroenterology;  Laterality: N/A;   EYE SURGERY  07/15/2023   Yag laser surgery   SKIN TAG REMOVAL     rectal    Medications:  Current Outpatient Medications on File Prior to Visit  Medication Sig   buPROPion  (WELLBUTRIN  XL) 300 MG 24 hr tablet Take 1 tablet (300 mg total) by mouth daily.   busPIRone  (BUSPAR ) 10 MG tablet Take 1 tablet (10 mg total) by mouth 2 (two) times daily.   Cholecalciferol (VITAMIN D  PO) Take 2,000 Units by mouth daily.    Cyanocobalamin  (B-12 PO) Take 1,000 mcg by mouth daily.    ibandronate  (BONIVA ) 150 MG tablet Take 1 tablet (150 mg total) by mouth every 30 (thirty) days. Take in the morning with a full glass of water, on an empty stomach, and do not take anything else by mouth or lie down for the next 30 min.   Magnesium 400 MG TABS Take 1 tablet by mouth daily at 2 PM.   Melatonin 10 MG TABS Take 1 tablet by mouth daily.   prednisoLONE acetate (PRED FORTE) 1 % ophthalmic suspension Place 1 drop into the left eye 4 (four) times daily.   No current facility-administered medications on file prior to visit.    Allergies:  Allergies  Allergen Reactions   Bactrim   [Sulfamethoxazole -Trimethoprim ] Nausea And Vomiting    Social History:  Social History   Socioeconomic History   Marital status: Divorced    Spouse name: Not on file   Number of children: 0   Years of education: Not on file   Highest education level: Associate degree: occupational, Scientist, product/process development, or vocational program  Occupational History   Occupation: retired    Comment: works part time  Tobacco Use   Smoking status: Never   Smokeless tobacco: Never  Vaping Use   Vaping status: Never Used  Substance and Sexual Activity   Alcohol use: Not Currently    Comment: rarely indulge   Drug use: Never   Sexual activity: Not Currently    Birth control/protection: Abstinence  Other Topics Concern   Not on file  Social History Narrative   Not on file   Social Drivers of Health   Financial Resource Strain: Medium Risk (04/16/2023)   Overall Financial Resource Strain (CARDIA)    Difficulty of Paying Living Expenses: Somewhat hard  Food Insecurity: No Food Insecurity (04/16/2023)   Hunger Vital Sign    Worried About Running Out of Food in the Last Year: Never true    Ran Out of Food in the Last Year: Never true  Transportation Needs: No Transportation Needs (04/16/2023)   PRAPARE - Administrator, Civil Service (Medical): No    Lack of Transportation (Non-Medical): No  Physical Activity: Inactive (04/16/2023)   Exercise Vital Sign    Days of Exercise per Week: 0 days    Minutes of Exercise per Session: 0 min  Stress: Stress Concern Present (04/16/2023)   [employer] of Occupational Health - Occupational Stress Questionnaire    Feeling of Stress : To some extent  Social Connections: Moderately Integrated (04/16/2023)   Social Connection and Isolation Panel [NHANES]    Frequency of Communication with Friends and Family: Twice a week    Frequency of Social Gatherings with Friends and Family: Three times a week  Attends Religious Services: More than 4 times per year    Active  Member of Clubs or Organizations: Yes    Attends Banker Meetings: More than 4 times per year    Marital Status: Divorced  Intimate Partner Violence: Not At Risk (04/16/2023)   Humiliation, Afraid, Rape, and Kick questionnaire    Fear of Current or Ex-Partner: No    Emotionally Abused: No    Physically Abused: No    Sexually Abused: No   Social History   Tobacco Use  Smoking Status Never  Smokeless Tobacco Never   Social History   Substance and Sexual Activity  Alcohol Use Not Currently   Comment: rarely indulge    Family History:  Family History  Problem Relation Age of Onset   Mental illness Mother    Depression Mother    Anxiety disorder Mother    Hearing loss Mother    Miscarriages / [place] Mother    Vision loss Mother    Dementia Mother    Parkinson's disease Mother    Stroke Father    Heart disease Father    Hearing loss Father    Vision loss Father    Depression Sister    Birth defects Sister    Early death Sister    Fibromyalgia Brother    GER disease Brother    Depression Brother    Depression Brother    Hypertension Brother    Hearing loss Maternal Aunt    Vision loss Maternal Aunt    Varicose Veins Maternal Aunt    Cancer Maternal Uncle    Hearing loss Maternal Uncle    Kidney disease Maternal Uncle    Asthma Paternal Uncle    Early death Paternal Uncle    Heart disease Paternal Uncle    Stroke Paternal Uncle    Cancer Maternal Grandmother        ovarian   Anemia Maternal Grandmother    Arthritis Maternal Grandmother    Lung disease Maternal Grandfather    Birth defects Maternal Grandfather    Asthma Maternal Grandfather    Heart disease Paternal Grandmother    Birth defects Paternal Grandfather    Hearing loss Paternal Grandfather    Heart disease Maternal Uncle    Breast cancer Neg Hx     Past medical history, surgical history, medications, allergies, family history and social history reviewed with patient today and  changes made to appropriate areas of the chart.   Review of Systems - negative All other ROS negative except what is listed above and in the HPI.      Objective:    BP 116/63   Pulse 81   Temp 98 F (36.7 C) (Oral)   Ht 5\' 6"  (1.676 m)   Wt 194 lb 12.8 oz (88.4 kg)   LMP  (LMP Unknown)   SpO2 97%   BMI 31.44 kg/m   Wt Readings from Last 3 Encounters:  07/19/23 194 lb 12.8 oz (88.4 kg)  04/16/23 190 lb (86.2 kg)  12/27/22 195 lb 6.4 oz (88.6 kg)    Physical Exam Vitals and nursing note reviewed.  Constitutional:      General: She is awake. She is not in acute distress.    Appearance: She is well-developed. She is not ill-appearing.  HENT:     Head: Normocephalic and atraumatic.     Right Ear: Hearing, tympanic membrane, ear canal and external ear normal. No drainage.     Left Ear: Hearing, tympanic membrane,  ear canal and external ear normal. No drainage.     Nose: Nose normal.     Right Sinus: No maxillary sinus tenderness or frontal sinus tenderness.     Left Sinus: No maxillary sinus tenderness or frontal sinus tenderness.     Mouth/Throat:     Mouth: Mucous membranes are moist.     Pharynx: Oropharynx is clear. Uvula midline. No pharyngeal swelling, oropharyngeal exudate or posterior oropharyngeal erythema.  Eyes:     General: Lids are normal.        Right eye: No discharge.        Left eye: No discharge.     Extraocular Movements: Extraocular movements intact.     Conjunctiva/sclera: Conjunctivae normal.     Pupils: Pupils are equal, round, and reactive to light.     Visual Fields: Right eye visual fields normal and left eye visual fields normal.  Neck:     Thyroid : No thyromegaly.     Vascular: No carotid bruit.     Trachea: Trachea normal.  Cardiovascular:     Rate and Rhythm: Normal rate and regular rhythm.     Heart sounds: Normal heart sounds. No murmur heard.    No gallop.  Pulmonary:     Effort: Pulmonary effort is normal. No accessory muscle usage or  respiratory distress.     Breath sounds: Normal breath sounds.  Chest:     Comments: Deferred per patient request - recent mammogram. Abdominal:     General: Bowel sounds are normal.     Palpations: Abdomen is soft. There is no hepatomegaly or splenomegaly.     Tenderness: There is no abdominal tenderness.  Musculoskeletal:     Right shoulder: Normal.     Left shoulder: Tenderness and crepitus present. No swelling, deformity or bony tenderness. Decreased range of motion (reduction in lateral, flexion, extension -- pain present with all). Decreased strength. Normal pulse.     Cervical back: Normal range of motion and neck supple.     Right lower leg: No edema.     Left lower leg: No edema.     Comments: Popping anteriorly to left shoulder.  No rashes.  Tenderness to anterior aspect.  Lymphadenopathy:     Head:     Right side of head: No submental, submandibular, tonsillar, preauricular or posterior auricular adenopathy.     Left side of head: No submental, submandibular, tonsillar, preauricular or posterior auricular adenopathy.     Cervical: No cervical adenopathy.  Skin:    General: Skin is warm and dry.     Capillary Refill: Capillary refill takes less than 2 seconds.     Findings: No rash.  Neurological:     Mental Status: She is alert and oriented to person, place, and time.     Gait: Gait is intact.     Deep Tendon Reflexes: Reflexes are normal and symmetric.     Reflex Scores:      Brachioradialis reflexes are 2+ on the right side and 2+ on the left side.      Patellar reflexes are 2+ on the right side and 2+ on the left side. Psychiatric:        Attention and Perception: Attention normal.        Mood and Affect: Mood normal. Affect is tearful.        Speech: Speech normal.        Behavior: Behavior normal. Behavior is cooperative.        Thought Content: Thought  content normal.        Judgment: Judgment normal.    Results for orders placed or performed in visit on  06/21/22  CBC with Differential/Platelet   Collection Time: 06/21/22 11:21 AM  Result Value Ref Range   WBC 7.6 3.4 - 10.8 x10E3/uL   RBC 5.10 3.77 - 5.28 x10E6/uL   Hemoglobin 15.1 11.1 - 15.9 g/dL   Hematocrit 40.9 81.1 - 46.6 %   MCV 91 79 - 97 fL   MCH 29.6 26.6 - 33.0 pg   MCHC 32.5 31.5 - 35.7 g/dL   RDW 91.4 78.2 - 95.6 %   Platelets 214 150 - 450 x10E3/uL   Neutrophils 64 Not Estab. %   Lymphs 27 Not Estab. %   Monocytes 7 Not Estab. %   Eos 2 Not Estab. %   Basos 0 Not Estab. %   Neutrophils Absolute 4.9 1.4 - 7.0 x10E3/uL   Lymphocytes Absolute 2.1 0.7 - 3.1 x10E3/uL   Monocytes Absolute 0.5 0.1 - 0.9 x10E3/uL   EOS (ABSOLUTE) 0.1 0.0 - 0.4 x10E3/uL   Basophils Absolute 0.0 0.0 - 0.2 x10E3/uL   Immature Granulocytes 0 Not Estab. %   Immature Grans (Abs) 0.0 0.0 - 0.1 x10E3/uL  Comprehensive metabolic panel   Collection Time: 06/21/22 11:21 AM  Result Value Ref Range   Glucose 107 (H) 70 - 99 mg/dL   BUN 21 8 - 27 mg/dL   Creatinine, Ser 2.13 0.57 - 1.00 mg/dL   eGFR 72 >08 MV/HQI/6.96   BUN/Creatinine Ratio 24 12 - 28   Sodium 140 134 - 144 mmol/L   Potassium 4.1 3.5 - 5.2 mmol/L   Chloride 102 96 - 106 mmol/L   CO2 25 20 - 29 mmol/L   Calcium  10.1 8.7 - 10.3 mg/dL   Total Protein 7.1 6.0 - 8.5 g/dL   Albumin 4.7 3.8 - 4.8 g/dL   Globulin, Total 2.4 1.5 - 4.5 g/dL   Albumin/Globulin Ratio 2.0 1.2 - 2.2   Bilirubin Total 0.6 0.0 - 1.2 mg/dL   Alkaline Phosphatase 83 44 - 121 IU/L   AST 19 0 - 40 IU/L   ALT 17 0 - 32 IU/L  Lipid Panel w/o Chol/HDL Ratio   Collection Time: 06/21/22 11:21 AM  Result Value Ref Range   Cholesterol, Total 182 100 - 199 mg/dL   Triglycerides 295 (H) 0 - 149 mg/dL   HDL 59 >28 mg/dL   VLDL Cholesterol Cal 33 5 - 40 mg/dL   LDL Chol Calc (NIH) 90 0 - 99 mg/dL  TSH   Collection Time: 06/21/22 11:21 AM  Result Value Ref Range   TSH 1.940 0.450 - 4.500 uIU/mL  VITAMIN D  25 Hydroxy (Vit-D Deficiency, Fractures)   Collection  Time: 06/21/22 11:21 AM  Result Value Ref Range   Vit D, 25-Hydroxy 42.6 30.0 - 100.0 ng/mL  Vitamin B12   Collection Time: 06/21/22 11:21 AM  Result Value Ref Range   Vitamin B-12 >2000 (H) 232 - 1245 pg/mL      Assessment & Plan:   Problem List Items Addressed This Visit       Respiratory   Sleep apnea   Chronic, stable.  Wears CPAP 100% of the time, continue this regimen.      Relevant Orders   CBC with Differential/Platelet     Digestive   GERD (gastroesophageal reflux disease)   Chronic, ongoing.  Continue Prilosec as needed.  Risks of PPI use were discussed with patient including  bone loss, C. Diff diarrhea, pneumonia, infections, CKD, electrolyte abnormalities.  Verbalizes understanding and chooses to continue the medication.           Musculoskeletal and Integument   Osteoporosis   Chronic, ongoing with poor tolerance to Fosamax in past.  She would like to try Prolia , in past was too costly but will attempt to get again since Boniva  offering minimal benefit.  Discontinue Boniva  is able to get Prolia , but consider referral to endocrinology to discuss infusions if unable to get Prolia .  Repeat DEXA around 02/17/25.      Relevant Medications   denosumab  (PROLIA ) 60 MG/ML SOSY injection   Other Relevant Orders   VITAMIN D  25 Hydroxy (Vit-D Deficiency, Fractures)     Other   Vitamin B12 deficiency   Chronic, stable.  Continue daily supplement and recheck B12 level today, adjust as needed.      Relevant Orders   Vitamin B12   Recurrent major depression-severe (HCC) - Primary   Chronic, stable.  Loss of mother in January 2024, whom she lived with for 30 years.  Denies SI/HI.  Will perform slow reduction of Duloxetine  due to minimal benefit and sweats. Continue Wellbutrin  and Buspar  + attempt to add on Rexulti if covered and affordable.  Continue Melatonin. Continue therapy, which is offering benefit.  Does get seasonal symptoms, which concerns her.  Obtain a Happy Light  and use this during the fall and winter months daily.  Educated her on medication and use + side effects. Have recommended if any worsening symptoms to alert provider immediately.      Relevant Medications   DULoxetine  (CYMBALTA ) 30 MG capsule   Other Relevant Orders   TSH   Obesity   BMI 31.44.  Recommended eating smaller high protein, low fat meals more frequently and exercising 30 mins a day 5 times a week with a goal of 10-15lb weight loss in the next 3 months. Patient voiced their understanding and motivation to adhere to these recommendations.       Mixed hyperlipidemia   Ongoing with some muscle aches with daily statin.  Discussed at length risks and benefits of continuing statin.  Tolerating Crestor  10 MG weekly.  Labs: CMP and lipid panel today.      Relevant Medications   rosuvastatin  (CRESTOR ) 10 MG tablet   Other Relevant Orders   Comprehensive metabolic panel with GFR   Lipid Panel w/o Chol/HDL Ratio   Insomnia   Chronic, ongoing.  Continue Melatonin as needed and therapy sessions.  Did not tolerate Trazodone  in past.      Acute pain of left shoulder   For over one month with no improvement.  Significant reduction in strength and ROM.  Will refer to ortho for further evaluation and recommendations.  Continue current at home regimen at present.      Relevant Orders   Ambulatory referral to Orthopedic Surgery   Other Visit Diagnoses       Encounter for annual physical exam       Annual physical today with labs and health maintenance reviewed, discussed with patient.        Follow up plan: Return in about 4 weeks (around 08/16/2023) for MOOD -- changed medications.   LABORATORY TESTING:  - Pap smear: not applicable  IMMUNIZATIONS:   - Tdap: Tetanus vaccination status reviewed: last tetanus booster within 10 years. - Influenza: Up to date - Pneumovax: Up to date - Prevnar: Up to date - HPV: Not applicable - Zostavax  vaccine: Up To Date - Covid: Up To  Date  SCREENING: -Mammogram: Up To Date on 02/18/23 - Colonoscopy: Up To Date due 05/25/2027 - Bone Density: Up to date on 02/18/23 -Hearing Test: Not applicable  -Spirometry: Not applicable   PATIENT COUNSELING:   Advised to take 1 mg of folate supplement per day if capable of pregnancy.   Sexuality: Discussed sexually transmitted diseases, partner selection, use of condoms, avoidance of unintended pregnancy  and contraceptive alternatives.   Advised to avoid cigarette smoking.  I discussed with the patient that most people either abstain from alcohol or drink within safe limits (<=14/week and <=4 drinks/occasion for males, <=7/weeks and <= 3 drinks/occasion for females) and that the risk for alcohol disorders and other health effects rises proportionally with the number of drinks per week and how often a drinker exceeds daily limits.  Discussed cessation/primary prevention of drug use and availability of treatment for abuse.   Diet: Encouraged to adjust caloric intake to maintain  or achieve ideal body weight, to reduce intake of dietary saturated fat and total fat, to limit sodium intake by avoiding high sodium foods and not adding table salt, and to maintain adequate dietary potassium and calcium  preferably from fresh fruits, vegetables, and low-fat dairy products.    Stressed the importance of regular exercise  Injury prevention: Discussed safety belts, safety helmets, smoke detector, smoking near bedding or upholstery.   Dental health: Discussed importance of regular tooth brushing, flossing, and dental visits.    NEXT PREVENTATIVE PHYSICAL DUE IN 1 YEAR. Return in about 4 weeks (around 08/16/2023) for MOOD -- changed medications.

## 2023-07-19 NOTE — Assessment & Plan Note (Signed)
 Chronic, ongoing.  Continue Prilosec as needed.  Risks of PPI use were discussed with patient including bone loss, C. Diff diarrhea, pneumonia, infections, CKD, electrolyte abnormalities.  Verbalizes understanding and chooses to continue the medication.

## 2023-07-19 NOTE — Assessment & Plan Note (Signed)
Chronic, stable.  Wears CPAP 100% of the time, continue this regimen. 

## 2023-07-19 NOTE — Assessment & Plan Note (Signed)
BMI 31.44. Recommended eating smaller high protein, low fat meals more frequently and exercising 30 mins a day 5 times a week with a goal of 10-15lb weight loss in the next 3 months. Patient voiced their understanding and motivation to adhere to these recommendations.

## 2023-07-19 NOTE — Assessment & Plan Note (Signed)
Chronic, stable.  Continue daily supplement and recheck B12 level today, adjust as needed. 

## 2023-07-19 NOTE — Telephone Encounter (Signed)
 Requested medications are due for refill today.  no  Requested medications are on the active medications list.  yes  Last refill. 07/19/2023 #30 2 rf  Future visit scheduled.   yes  Notes to clinic.    Pharmacy comment: Alternative Requested:COST 755 DOLLARS.     Requested Prescriptions  Pending Prescriptions Disp Refills   REXULTI 0.5 MG TABS [Pharmacy Med Name: REXULTI 0.5 MG TABLET] 30 tablet 2    Sig: TAKE 1 TABLET BY MOUTH EVERY DAY     Off-Protocol Failed - 07/19/2023  5:47 PM      Failed - Medication not assigned to a protocol, review manually.      Passed - Valid encounter within last 12 months    Recent Outpatient Visits           Today Severe episode of recurrent major depressive disorder, without psychotic features [facility])   [facility] [place], Lavelle Posey, NP

## 2023-07-19 NOTE — Assessment & Plan Note (Signed)
 Chronic, ongoing.  Continue Melatonin as needed and therapy sessions.  Did not tolerate Trazodone  in past.

## 2023-07-19 NOTE — Assessment & Plan Note (Signed)
 Ongoing with some muscle aches with daily statin.  Discussed at length risks and benefits of continuing statin.  Tolerating Crestor  10 MG weekly.  Labs: CMP and lipid panel today.

## 2023-07-19 NOTE — Assessment & Plan Note (Signed)
 For over one month with no improvement.  Significant reduction in strength and ROM.  Will refer to ortho for further evaluation and recommendations.  Continue current at home regimen at present.

## 2023-07-19 NOTE — Assessment & Plan Note (Signed)
 Chronic, stable.  Loss of mother in January 2024, whom she lived with for 30 years.  Denies SI/HI.  Will perform slow reduction of Duloxetine  due to minimal benefit and sweats. Continue Wellbutrin  and Buspar  + attempt to add on Rexulti if covered and affordable.  Continue Melatonin. Continue therapy, which is offering benefit.  Does get seasonal symptoms, which concerns her.  Obtain a Happy Light and use this during the fall and winter months daily.  Educated her on medication and use + side effects. Have recommended if any worsening symptoms to alert provider immediately.

## 2023-07-19 NOTE — Assessment & Plan Note (Signed)
 Chronic, ongoing with poor tolerance to Fosamax in past.  She would like to try Prolia , in past was too costly but will attempt to get again since Boniva  offering minimal benefit.  Discontinue Boniva  is able to get Prolia , but consider referral to endocrinology to discuss infusions if unable to get Prolia .  Repeat DEXA around 02/17/25.

## 2023-07-20 LAB — LIPID PANEL W/O CHOL/HDL RATIO
Cholesterol, Total: 160 mg/dL (ref 100–199)
HDL: 57 mg/dL (ref >39)
LDL Chol Calc (NIH): 82 mg/dL (ref 0–99)
Triglycerides: 117 mg/dL (ref 0–149)
VLDL Cholesterol Cal: 21 mg/dL (ref 5–40)

## 2023-07-20 LAB — TSH: TSH: 1.87 u[IU]/mL (ref 0.450–4.500)

## 2023-07-20 LAB — CBC WITH DIFFERENTIAL/PLATELET
Basophils Absolute: 0.1 10*3/uL (ref 0.0–0.2)
Basos: 1 %
EOS (ABSOLUTE): 0.1 10*3/uL (ref 0.0–0.4)
Eos: 2 %
Hematocrit: 41.8 % (ref 34.0–46.6)
Hemoglobin: 13.5 g/dL (ref 11.1–15.9)
Immature Grans (Abs): 0 10*3/uL (ref 0.0–0.1)
Immature Granulocytes: 0 %
Lymphocytes Absolute: 0.7 10*3/uL (ref 0.7–3.1)
Lymphs: 14 %
MCH: 30.3 pg (ref 26.6–33.0)
MCHC: 32.3 g/dL (ref 31.5–35.7)
MCV: 94 fL (ref 79–97)
Monocytes Absolute: 0.5 10*3/uL (ref 0.1–0.9)
Monocytes: 9 %
Neutrophils Absolute: 3.9 10*3/uL (ref 1.4–7.0)
Neutrophils: 74 %
Platelets: 187 10*3/uL (ref 150–450)
RBC: 4.45 x10E6/uL (ref 3.77–5.28)
RDW: 12.9 % (ref 11.7–15.4)
WBC: 5.2 10*3/uL (ref 3.4–10.8)

## 2023-07-20 LAB — COMPREHENSIVE METABOLIC PANEL WITH GFR
ALT: 14 IU/L (ref 0–32)
AST: 13 IU/L (ref 0–40)
Albumin: 4.2 g/dL (ref 3.8–4.8)
Alkaline Phosphatase: 78 IU/L (ref 44–121)
BUN/Creatinine Ratio: 18 (ref 12–28)
BUN: 16 mg/dL (ref 8–27)
Bilirubin Total: 0.4 mg/dL (ref 0.0–1.2)
CO2: 21 mmol/L (ref 20–29)
Calcium: 9.2 mg/dL (ref 8.7–10.3)
Chloride: 103 mmol/L (ref 96–106)
Creatinine, Ser: 0.88 mg/dL (ref 0.57–1.00)
Globulin, Total: 1.9 g/dL (ref 1.5–4.5)
Glucose: 96 mg/dL (ref 70–99)
Potassium: 4 mmol/L (ref 3.5–5.2)
Sodium: 139 mmol/L (ref 134–144)
Total Protein: 6.1 g/dL (ref 6.0–8.5)
eGFR: 70 mL/min/{1.73_m2} (ref >59)

## 2023-07-20 LAB — VITAMIN D 25 HYDROXY (VIT D DEFICIENCY, FRACTURES): Vit D, 25-Hydroxy: 42.9 ng/mL (ref 30.0–100.0)

## 2023-07-20 LAB — VITAMIN B12: Vitamin B-12: 664 pg/mL (ref 232–1245)

## 2023-07-21 ENCOUNTER — Ambulatory Visit: Payer: Self-pay | Admitting: Nurse Practitioner

## 2023-07-21 NOTE — Progress Notes (Signed)
 Contacted via MyChart   Good morning Marie Perry, your labs have returned and look great. Continue all medication as ordered.  Any questions? Keep being stellar!!  Thank you for allowing me to participate in your care.  I appreciate you. Kindest regards, Nekeisha Aure

## 2023-07-22 MED ORDER — DULOXETINE HCL 60 MG PO CPEP: 60.0000 mg | ORAL_CAPSULE | Freq: Every day | ORAL | 3 refills | Status: DC

## 2023-07-22 NOTE — Addendum Note (Signed)
 Addended by: Ouita Nish T on: 07/22/2023 12:30 PM   Modules accepted: Orders

## 2023-07-23 NOTE — Telephone Encounter (Signed)
 Called and notified patient of Marie Perry's message. Patient states she is ok with the change and is already seeing someone for therapy.

## 2023-07-24 ENCOUNTER — Telehealth: Payer: Self-pay

## 2023-07-24 NOTE — Telephone Encounter (Signed)
 Copied from CRM [id number]. Topic: Clinical - Medication Question >> Jul 24, 2023  2:05 PM [name] wrote: Reason for CRM: Marie Perry[facility] says that medication  denosumab  (PROLIA ) 60 MG/ML SOSY injection cannot be filled by [facility] because it was administered in office, insurance is denying it per Marie Perry.

## 2023-07-25 NOTE — Telephone Encounter (Signed)
 Can we check into this and see why the patient's insurance is denying it?

## 2023-07-26 ENCOUNTER — Ambulatory Visit (INDEPENDENT_AMBULATORY_CARE_PROVIDER_SITE_OTHER): Admitting: Clinical

## 2023-07-26 ENCOUNTER — Other Ambulatory Visit (HOSPITAL_COMMUNITY): Payer: Self-pay

## 2023-07-26 DIAGNOSIS — F331 Major depressive disorder, recurrent, moderate: Secondary | ICD-10-CM | POA: Diagnosis not present

## 2023-07-26 DIAGNOSIS — F418 Other specified anxiety disorders: Secondary | ICD-10-CM

## 2023-07-26 NOTE — Progress Notes (Signed)
   Marie Barthel, LCSW

## 2023-07-26 NOTE — Telephone Encounter (Signed)
 Good morning Marie Perry. Marie Perry insurance paid the Prolia  claim, however her copay is still extremely expensive $982.00. She can contact her insurance company and request that they put her on the Medicare Prescription Payment Plan and she would have to pay little or no copay up front at her pharmacy and also they can better explain how her payments would be spread out.

## 2023-07-26 NOTE — Progress Notes (Signed)
 [facility] Counselor/Therapist Progress Note  Patient ID: Marie Perry, MRN: [id number],    Date: 07/26/2023  Time Spent: 10:39am - 11:42am : 63 minutes  Treatment Type: Individual Therapy  Reported Symptoms: worry/anxiety  Mental Status Exam: Appearance:  Neat and Well Groomed     Behavior: Appropriate  Motor: Normal  Speech/Language:  Clear and Coherent and Normal Rate  Affect: Appropriate  Mood: normal  Thought process: normal  Thought content:   WNL  Sensory/Perceptual disturbances:   WNL  Orientation: oriented to person, place, time/date, and situation  Attention: Good  Concentration: Good  Memory: WNL  Fund of knowledge:  Good  Insight:   Good  Judgment:  Good  Impulse Control: Good   Risk Assessment: Danger to Self:  No Patient denied current suicidal ideation  Self-injurious Behavior: No Danger to Others: No Patient denied current homicidal ideation Duty to Warn:no Physical Aggression / Violence:No  Access to Firearms a concern: No  Gang Involvement:No   Subjective: Patient reported patient's brother and sister in law visited patient recently. Patient stated, I told them I was having a hard time thinking about my age. Patient reported she recently had eye surgery and is scheduled for surgery on patient's right eye on Monday. Patient reported patient has an appointment with an orthopedic surgeon to address pain in patient's right shoulder. Patient stated, it just makes me anxious in reference to appointment with orthopedic surgeon. Patient reported she challenged worry/thoughts associated with the upcoming orthopedic appointment. Patient reported she is aging and is experiencing some physical changes.  Patient stated, I think its really added to my feelings that I watched momma die. Patient reported fear of not being able to communicate patient's feelings in the future. Patient stated, I don't want to be a burden to anyone else in my  family. Patient reported patient experiences thoughts related to what if in regard to family members' health, future events, and patient/family member's future needs. Patient reported thoughts of who's going to be caring for whom in reference to providing care for patient and other family members in the future.  Interventions: Cognitive Behavioral Therapy. Clinician conducted session via caregility video from clinician's home office. Patient provided verbal consent to proceed with telehealth session and is aware of limitations of telephone or video visits. Patient participated in session from patient's home. Reviewed events since last session and assessed for changes. Discussed patient's implementation of challenging thoughts associated with upcoming orthopedic appointment. Processed patient's thoughts/feelings as it relates to to patient's age. Provided psycho education related to cognitive distortion of over generalization, challenged statements/thoughts to assist patient in reframing thoughts. Assisted patient in practicing challenging cognitive distortion of over generalization and identifying evidence for/against thoughts. Clinician requested for homework patient continue thought record and practice challenging cognitive distortions.    Collaboration of Care: Other not required at this time   Diagnosis:  Major depressive disorder, recurrent episode, moderate with anxious distress     Plan: Patient is to utilize [employer] Therapy, thought re-framing, behavioral activation, and coping strategies to decrease symptoms associated with their diagnosis. Frequency: bi-weekly  Modality: individual      Long-term goal:   Reduce overall level, frequency, and intensity of the feelings of depression as evidenced by decreased crying, difficulty falling asleep and staying asleep, changes in appetite, lack of energy, fatigue, lack of motivation, difficulty focusing, and loss of interest from 7  days/week to 2 to 3 days/week per patient report for at least 3 consecutive months. Target  Date: 2023-09-02  Progress: progressing    Short-term goal:  Begin a healthy grieving process around the loss of patient's mother Target Date: 09-02-2023  Progress: progressing    Decrease negative thoughts associated with patient's mother's death Target Date: 2023-09-02  Progress: progressing    Identify, challenge, and replace negative thought patterns that contribute to feelings of depression with positive thoughts and beliefs per patient's report Target Date: September 02, 2023  Progress: progressing    Increase activities that patient enjoys to support increased motivation and mood, such as, socializing with friends weekly, completing puzzles, playing games. Target Date: 09-02-2023  Progress: progressing       Burlene Carpen, LCSW

## 2023-07-29 DIAGNOSIS — H26491 Other secondary cataract, right eye: Secondary | ICD-10-CM | POA: Diagnosis not present

## 2023-08-01 DIAGNOSIS — M13812 Other specified arthritis, left shoulder: Secondary | ICD-10-CM | POA: Diagnosis not present

## 2023-08-01 DIAGNOSIS — M7542 Impingement syndrome of left shoulder: Secondary | ICD-10-CM | POA: Diagnosis not present

## 2023-08-09 ENCOUNTER — Ambulatory Visit (INDEPENDENT_AMBULATORY_CARE_PROVIDER_SITE_OTHER): Admitting: Clinical

## 2023-08-09 DIAGNOSIS — F331 Major depressive disorder, recurrent, moderate: Secondary | ICD-10-CM | POA: Diagnosis not present

## 2023-08-09 NOTE — Progress Notes (Signed)
 [facility] Counselor/Therapist Progress Note  Patient ID: Marie Perry, MRN: [id number],    Date: 08/09/2023  Time Spent: 1:36pm - 2:32pm : 56 minutes  Treatment Type: Individual Therapy  Reported Symptoms: worry  Mental Status Exam: Appearance:  Neat and Well Groomed     Behavior: Appropriate  Motor: Normal  Speech/Language:  Clear and Coherent and Normal Rate  Affect: Appropriate  Mood: normal  Thought process: tangential  Thought content:   Tangential  Sensory/Perceptual disturbances:   WNL  Orientation: oriented to person, place, time/date, and situation  Attention: Good  Concentration: Good  Memory: WNL  Fund of knowledge:  Good  Insight:   Good  Judgment:  Good  Impulse Control: Good   Risk Assessment: Danger to Self:  No Patient denied current suicidal ideation  Self-injurious Behavior: No Danger to Others: No Patient denied current homicidal ideation Duty to Warn:no Physical Aggression / Violence:No  Access to Firearms a concern: No  Gang Involvement:No   Subjective: Patient reported patient recently had surgery on patient's right eye and patient stated, I didn't have any problem in reference to the surgery. Patient reported a recent appointment with an orthopedic provider and patient was diagnosed with rotator cuff tendonitis with inflammation. Patient stated, I think it's been pretty good in response to symptoms of anxiety since last session. Patient stated, I was trying not to dwell on the negative or what it could be in reference to recent appointments. Patient reported patient is cat sitting for patient's brother. Patient reported patient's brother did not bring the cat's tree and patient was worried about the cat. Patient reported patient tries to distract her thoughts, reads, watches television, and enjoys learning about other cultures as forms of relaxation.    Interventions: Cognitive Behavioral Therapy. Clinician conducted  session via caregility video from clinician's home office. Patient provided verbal consent to proceed with telehealth session and is aware of limitations of telephone or video visits. Patient participated in session from patient's home. Reviewed events since last session and assessed for changes. Discussed recent medical appointments and the outcome. Reviewed patient's thought record entries and processed worry associated with brother's cat. Explored current coping strategies patient utilizes to relax. Provided psycho education related to mindfulness and deep breathing exercise. Clinician requested for homework patient continue thought record and practice challenging cognitive distortions.    Collaboration of Care: Other not required at this time   Diagnosis:  Major depressive disorder, recurrent episode, moderate with anxious distress     Plan: Patient is to utilize [employer] Therapy, thought re-framing, behavioral activation, and coping strategies to decrease symptoms associated with their diagnosis. Frequency: bi-weekly  Modality: individual      Long-term goal:   Reduce overall level, frequency, and intensity of the feelings of depression as evidenced by decreased crying, difficulty falling asleep and staying asleep, changes in appetite, lack of energy, fatigue, lack of motivation, difficulty focusing, and loss of interest from 7 days/week to 2 to 3 days/week per patient report for at least 3 consecutive months. Target Date: 09-13-2023  Progress: progressing    Short-term goal:  Begin a healthy grieving process around the loss of patient's mother Target Date: September 13, 2023  Progress: progressing    Decrease negative thoughts associated with patient's mother's death Target Date: 2023-09-13  Progress: progressing    Identify, challenge, and replace negative thought patterns that contribute to feelings of depression with positive thoughts and beliefs per patient's report Target Date: 09/13/23   Progress: progressing  Increase activities that patient enjoys to support increased motivation and mood, such as, socializing with friends weekly, completing puzzles, playing games. Target Date: 08/17/23  Progress: progressing     [name], LCSW

## 2023-08-09 NOTE — Progress Notes (Signed)
   Marie Barthel, LCSW

## 2023-08-15 ENCOUNTER — Other Ambulatory Visit: Payer: Self-pay | Admitting: Nurse Practitioner

## 2023-08-16 NOTE — Telephone Encounter (Signed)
 Requested Prescriptions  Refused Prescriptions Disp Refills   DULoxetine  (CYMBALTA ) 30 MG capsule [Pharmacy Med Name: DULOXETINE  HCL DR 30 MG CAP] 14 capsule 0    Sig: TAKE 1 CAPSULE DAILY FOR 7 DAYS, THEN 1 CAPSULE EVERY OTHER DAY FOR 7 DAYS, THEN 1 CAPSULE 2 TIMES A WEEK FOR 7 DAYS, THEN 1 CAPSULE ONCE A WEEK FOR 7 DAYS.     Psychiatry: Antidepressants - SNRI - duloxetine  Passed - 08/16/2023  6:25 PM      Passed - Cr in normal range and within 360 days    Creatinine, Ser  Date Value Ref Range Status  07/19/2023 0.88 0.57 - 1.00 mg/dL Final         Passed - eGFR is 30 or above and within 360 days    GFR calc Af Amer  Date Value Ref Range Status  05/08/2019 70 >59 mL/min/1.73 Final   GFR calc non Af Amer  Date Value Ref Range Status  05/08/2019 61 >59 mL/min/1.73 Final   eGFR  Date Value Ref Range Status  07/19/2023 70 >59 mL/min/1.73 Final         Passed - Completed PHQ-2 or PHQ-9 in the last 360 days      Passed - Last BP in normal range    BP Readings from Last 1 Encounters:  07/19/23 116/63         Passed - Valid encounter within last 6 months    Recent Outpatient Visits           4 weeks ago Severe episode of recurrent major depressive disorder, without psychotic features (HCC)   [place] [facility] [place], Melanie DASEN, NP

## 2023-08-17 NOTE — Patient Instructions (Signed)
 Be Involved in Caring For Your Health:  Taking Medications When medications are taken as directed, they can greatly improve your health. But if they are not taken as prescribed, they may not work. In some cases, not taking them correctly can be harmful. To help ensure your treatment remains effective and safe, understand your medications and how to take them. Bring your medications to each visit for review by your provider.  Your lab results, notes, and after visit summary will be available on My Chart. We strongly encourage you to use this feature. If lab results are abnormal the clinic will contact you with the appropriate steps. If the clinic does not contact you assume the results are satisfactory. You can always view your results on My Chart. If you have questions regarding your health or results, please contact the clinic during office hours. You can also ask questions on My Chart.  We at [facility] are grateful that you chose [place] to provide your care. We strive to provide evidence-based and compassionate care and are always looking for feedback. If you get a survey from the clinic please complete this so we can hear your opinions.  Managing Depression, Adult Depression is a mental health condition that affects your thoughts, feelings, and actions. Being diagnosed with depression can bring you relief if you did not know why you have felt or behaved a certain way. It could also leave you feeling overwhelmed. Finding ways to manage your symptoms can help you feel more positive about your future. How to manage lifestyle changes Being depressed is difficult. Depression can increase the level of everyday stress. Stress can make depression symptoms worse. You may believe your symptoms cannot be managed or will never improve. However, there are many things you can try to help manage your symptoms. There is hope. Managing stress  Stress is your body's reaction to life changes and events,  both good and bad. Stress can add to your feelings of depression. Learning to manage your stress can help lessen your feelings of depression. Try some of the following approaches to reducing your stress (stress reduction techniques): Listen to music that you enjoy and that inspires you. Try using a meditation app or take a meditation class. Develop a practice that helps you connect with your spiritual self. Walk in nature, pray, or go to a place of worship. Practice deep breathing. To do this, inhale slowly through your nose. Pause at the top of your inhale for a few seconds and then exhale slowly, letting yourself relax. Repeat this three or four times. Practice yoga to help relax and work your muscles. Choose a stress reduction technique that works for you. These techniques take time and practice to develop. Set aside 5-15 minutes a day to do them. Therapists can offer training in these techniques. Do these things to help manage stress: Keep a journal. Know your limits. Set healthy boundaries for yourself and others, such as saying "no" when you think something is too much. Pay attention to how you react to certain situations. You may not be able to control everything, but you can change your reaction. Add humor to your life by watching funny movies or shows. Make time for activities that you enjoy and that relax you. Spend less time using electronics, especially at night before bed. The light from screens can make your brain think it is time to get up rather than go to bed.  Medicines Medicines, such as antidepressants, are often a part of  treatment for depression. Talk with your pharmacist or health care provider about all the medicines, supplements, and herbal products that you take, their possible side effects, and what medicines and other products are safe to take together. Make sure to report any side effects you may have to your health care provider. Relationships Your health care  provider may suggest family therapy, couples therapy, or individual therapy as part of your treatment. How to recognize changes Everyone responds differently to treatment for depression. As you recover from depression, you may start to: Have more interest in doing activities. Feel more hopeful. Have more energy. Eat a more regular amount of food. Have better mental focus. It is important to recognize if your depression is not getting better or is getting worse. The symptoms you had in the beginning may return, such as: Feeling tired. Eating too much or too little. Sleeping too much or too little. Feeling restless, agitated, or hopeless. Trouble focusing or making decisions. Having unexplained aches and pains. Feeling irritable, angry, or aggressive. If you or your family members notice these symptoms coming back, let your health care provider know right away. Follow these instructions at home: Activity Try to get some form of exercise each day, such as walking. Try yoga, mindfulness, or other stress reduction techniques. Participate in group activities if you are able. Lifestyle Get enough sleep. Cut down on or stop using caffeine, tobacco, alcohol, and any other harmful substances. Eat a healthy diet that includes plenty of vegetables, fruits, whole grains, low-fat dairy products, and lean protein. Limit foods that are high in solid fats, added sugar, or salt (sodium). General instructions Take over-the-counter and prescription medicines only as told by your health care provider. Keep all follow-up visits. It is important for your health care provider to check on your mood, behavior, and medicines. Your health care provider may need to make changes to your treatment. Where to find support Talking to others  Friends and family members can be sources of support and guidance. Talk to trusted friends or family members about your condition. Explain your symptoms and let them know that you  are working with a health care provider to treat your depression. Tell friends and family how they can help. Finances Find mental health providers that fit with your financial situation. Talk with your health care provider if you are worried about access to food, housing, or medicine. Call your insurance company to learn about your co-pays and prescription plan. Where to find more information You can find support in your area from: Anxiety and Depression Association of America (ADAA): adaa.org Mental Health America: mentalhealthamerica.net [employer] on Mental Illness: nami.org Contact a health care provider if: You stop taking your antidepressant medicines, and you have any of these symptoms: Nausea. Headache. Light-headedness. Chills and body aches. Not being able to sleep (insomnia). You or your friends and family think your depression is getting worse. Get help right away if: You have thoughts of hurting yourself or others. Get help right away if you feel like you may hurt yourself or others, or have thoughts about taking your own life. Go to your nearest emergency room or: Call 911. Call the National Suicide Prevention Lifeline at 315-804-2514 or 988. This is open 24 hours a day. Text the Crisis Text Line at 902-341-0291. This information is not intended to replace advice given to you by your health care provider. Make sure you discuss any questions you have with your health care provider. Document Revised: 06/06/2021 Document Reviewed:  06/06/2021 Elsevier Patient Education  2024 [employer].

## 2023-08-22 ENCOUNTER — Encounter: Payer: Self-pay | Admitting: Nurse Practitioner

## 2023-08-22 ENCOUNTER — Ambulatory Visit (INDEPENDENT_AMBULATORY_CARE_PROVIDER_SITE_OTHER): Admitting: Nurse Practitioner

## 2023-08-22 VITALS — BP 98/62 | HR 76 | Temp 99.0°F | Ht 66.0 in | Wt 196.0 lb

## 2023-08-22 DIAGNOSIS — F332 Major depressive disorder, recurrent severe without psychotic features: Secondary | ICD-10-CM | POA: Diagnosis not present

## 2023-08-22 MED ORDER — DENOSUMAB 60 MG/ML ~~LOC~~ SOSY: 60.0000 mg | PREFILLED_SYRINGE | SUBCUTANEOUS | 1 refills | Status: DC

## 2023-08-22 MED ORDER — ARIPIPRAZOLE 2 MG PO TABS: 2.0000 mg | ORAL_TABLET | Freq: Every day | ORAL | 5 refills | Status: DC

## 2023-08-22 NOTE — Assessment & Plan Note (Signed)
 Chronic, stable.  Loss of mother in January 2024, whom she lived with for 30 years.  Denies SI/HI.  Unable to get Rexulti  covered.  Continue Wellbutrin  and Buspar .  Will see if can get low dose of Abilify  covered and if able to will slowly reduce of Duloxetine  due to sweats with this.  Continue Melatonin. Continue therapy, which is offering a lot of benefit.  Does get seasonal symptoms, which concerns her.  Obtain a Happy Light and use this during the fall and winter months daily.  Educated her on medication and use + side effects. Have recommended if any worsening symptoms to alert provider immediately.

## 2023-08-22 NOTE — Progress Notes (Signed)
 BP 98/62   Pulse 76   Temp 99 F (37.2 C) (Oral)   Ht 5' 6 (1.676 m)   Wt 196 lb (88.9 kg)   LMP  (LMP Unknown)   SpO2 98%   BMI 31.64 kg/m    Subjective:    Patient ID: Marie Perry, female    DOB: 1950-07-07, 73 y.o.   MRN: [id number]  HPI: Marie Perry is a 73 y.o. female  Chief Complaint  Patient presents with   Depression   DEPRESSION Follow-up for mood today.  Attends therapy, last visit 08/09/23.  Taking Duloxetine , Wellbutrin , and Buspar . Does endorse getting sweats from Duloxetine . Could not get Rexulti , was not covered. Mood status: feels like is doing better Satisfied with current treatment?: yes Symptom severity: moderate  Duration of current treatment : chronic Side effects: no Medication compliance: good compliance Psychotherapy/counseling: in past Previous psychiatric medications: Cymbalta  and Buspar  Depressed mood: not recently Anxious mood: yes -- because brother is in [place] Anhedonia: yes Significant weight loss or gain: no Insomnia: yes, difficulty falling asleep Fatigue: no Feelings of worthlessness or guilt: no Impaired concentration/indecisiveness: occasional Suicidal ideations: no Hopelessness: no Crying spells: no    08/22/2023    9:49 AM 07/19/2023    9:47 AM 04/16/2023    9:42 AM 12/27/2022   11:10 AM 11/02/2022   10:55 AM  Depression screen PHQ 2/9  Decreased Interest 1 1 3 1 1   Down, Depressed, Hopeless 1 2 1 1 1   PHQ - 2 Score 2 3 4 2 2   Altered sleeping 1 1 1 1 1   Tired, decreased energy 1 3 2 2 2   Change in appetite 1 1 1 1 1   Feeling bad or failure about yourself  1 1 0 1 1  Trouble concentrating 0 1 1 1 1   Moving slowly or fidgety/restless 0 0 0 1 0  Suicidal thoughts 0 0 0 0 0  PHQ-9 Score 6 10 9 9 8   Difficult doing work/chores Somewhat difficult Somewhat difficult Somewhat difficult Somewhat difficult Somewhat difficult       08/22/2023    9:49 AM 07/19/2023    9:44 AM 12/27/2022   11:11 AM 11/02/2022    10:55 AM  GAD 7 : Generalized Anxiety Score  Nervous, Anxious, on Edge 1 1 1 1   Control/stop worrying 0 1 1 1   Worry too much - different things 0 1 0 1  Trouble relaxing 1 0 1 0  Restless 0 0 0 0  Easily annoyed or irritable 0 1 0 0  Afraid - awful might happen 0 0 1 0  Total GAD 7 Score 2 4 4 3   Anxiety Difficulty Not difficult at all Not difficult at all Not difficult at all Not difficult at all   Relevant past medical, surgical, family and social history reviewed and updated as indicated. Interim medical history since our last visit reviewed. Allergies and medications reviewed and updated.  Review of Systems  Constitutional:  Negative for activity change, appetite change, diaphoresis, fatigue and fever.  Respiratory:  Negative for cough, chest tightness and shortness of breath.   Cardiovascular:  Negative for chest pain, palpitations and leg swelling.  Gastrointestinal: Negative.   Neurological: Negative.   Psychiatric/Behavioral:  Positive for decreased concentration and sleep disturbance. Negative for self-injury and suicidal ideas. The patient is nervous/anxious.     Per HPI unless specifically indicated above     Objective:    BP 98/62   Pulse 76  Temp 99 F (37.2 C) (Oral)   Ht 5' 6 (1.676 m)   Wt 196 lb (88.9 kg)   LMP  (LMP Unknown)   SpO2 98%   BMI 31.64 kg/m   Wt Readings from Last 3 Encounters:  08/22/23 196 lb (88.9 kg)  07/19/23 194 lb 12.8 oz (88.4 kg)  04/16/23 190 lb (86.2 kg)    Physical Exam Vitals and nursing note reviewed.  Constitutional:      General: She is awake. She is not in acute distress.    Appearance: She is well-developed and well-groomed. She is obese. She is not ill-appearing or toxic-appearing.  HENT:     Head: Normocephalic.     Right Ear: Hearing and external ear normal.     Left Ear: Hearing and external ear normal.  Eyes:     General: Lids are normal.        Right eye: No discharge.        Left eye: No discharge.      Conjunctiva/sclera: Conjunctivae normal.     Pupils: Pupils are equal, round, and reactive to light.  Neck:     Thyroid : No thyromegaly.     Vascular: No carotid bruit.  Cardiovascular:     Rate and Rhythm: Normal rate and regular rhythm.     Heart sounds: Normal heart sounds. No murmur heard.    No gallop.  Pulmonary:     Effort: Pulmonary effort is normal. No accessory muscle usage or respiratory distress.     Breath sounds: Normal breath sounds.  Abdominal:     General: Bowel sounds are normal. There is no distension.     Palpations: Abdomen is soft.     Tenderness: There is no abdominal tenderness.  Musculoskeletal:     Cervical back: Normal range of motion and neck supple.     Right lower leg: No edema.     Left lower leg: No edema.  Lymphadenopathy:     Cervical: No cervical adenopathy.  Skin:    General: Skin is warm and dry.  Neurological:     Mental Status: She is alert and oriented to person, place, and time.     Deep Tendon Reflexes: Reflexes are normal and symmetric.     Reflex Scores:      Brachioradialis reflexes are 2+ on the right side and 2+ on the left side.      Patellar reflexes are 2+ on the right side and 2+ on the left side. Psychiatric:        Attention and Perception: Attention normal.        Mood and Affect: Mood normal.        Speech: Speech normal.        Behavior: Behavior normal. Behavior is cooperative.        Thought Content: Thought content normal.    Results for orders placed or performed in visit on 07/19/23  CBC with Differential/Platelet   Collection Time: 07/19/23 10:39 AM  Result Value Ref Range   WBC 5.2 3.4 - 10.8 x10E3/uL   RBC 4.45 3.77 - 5.28 x10E6/uL   Hemoglobin 13.5 11.1 - 15.9 g/dL   Hematocrit 58.1 65.9 - 46.6 %   MCV 94 79 - 97 fL   MCH 30.3 26.6 - 33.0 pg   MCHC 32.3 31.5 - 35.7 g/dL   RDW 87.0 88.2 - 84.5 %   Platelets 187 150 - 450 x10E3/uL   Neutrophils 74 Not Estab. %   Lymphs 14 Not  Estab. %   Monocytes 9 Not  Estab. %   Eos 2 Not Estab. %   Basos 1 Not Estab. %   Neutrophils Absolute 3.9 1.4 - 7.0 x10E3/uL   Lymphocytes Absolute 0.7 0.7 - 3.1 x10E3/uL   Monocytes Absolute 0.5 0.1 - 0.9 x10E3/uL   EOS (ABSOLUTE) 0.1 0.0 - 0.4 x10E3/uL   Basophils Absolute 0.1 0.0 - 0.2 x10E3/uL   Immature Granulocytes 0 Not Estab. %   Immature Grans (Abs) 0.0 0.0 - 0.1 x10E3/uL  Comprehensive metabolic panel with GFR   Collection Time: 07/19/23 10:39 AM  Result Value Ref Range   Glucose 96 70 - 99 mg/dL   BUN 16 8 - 27 mg/dL   Creatinine, Ser 9.11 0.57 - 1.00 mg/dL   eGFR 70 >40 fO/fpw/8.26   BUN/Creatinine Ratio 18 12 - 28   Sodium 139 134 - 144 mmol/L   Potassium 4.0 3.5 - 5.2 mmol/L   Chloride 103 96 - 106 mmol/L   CO2 21 20 - 29 mmol/L   Calcium  9.2 8.7 - 10.3 mg/dL   Total Protein 6.1 6.0 - 8.5 g/dL   Albumin 4.2 3.8 - 4.8 g/dL   Globulin, Total 1.9 1.5 - 4.5 g/dL   Bilirubin Total 0.4 0.0 - 1.2 mg/dL   Alkaline Phosphatase 78 44 - 121 IU/L   AST 13 0 - 40 IU/L   ALT 14 0 - 32 IU/L  Lipid Panel w/o Chol/HDL Ratio   Collection Time: 07/19/23 10:39 AM  Result Value Ref Range   Cholesterol, Total 160 100 - 199 mg/dL   Triglycerides 882 0 - 149 mg/dL   HDL 57 >60 mg/dL   VLDL Cholesterol Cal 21 5 - 40 mg/dL   LDL Chol Calc (NIH) 82 0 - 99 mg/dL  TSH   Collection Time: 07/19/23 10:39 AM  Result Value Ref Range   TSH 1.870 0.450 - 4.500 uIU/mL  Vitamin B12   Collection Time: 07/19/23 10:39 AM  Result Value Ref Range   Vitamin B-12 664 232 - 1,245 pg/mL  VITAMIN D  25 Hydroxy (Vit-D Deficiency, Fractures)   Collection Time: 07/19/23 10:39 AM  Result Value Ref Range   Vit D, 25-Hydroxy 42.9 30.0 - 100.0 ng/mL      Assessment & Plan:   Problem List Items Addressed This Visit       Other   Recurrent major depression-severe (HCC) - Primary   Chronic, stable.  Loss of mother in January 2024, whom she lived with for 30 years.  Denies SI/HI.  Unable to get Rexulti  covered.  Continue  Wellbutrin  and Buspar .  Will see if can get low dose of Abilify  covered and if able to will slowly reduce of Duloxetine  due to sweats with this.  Continue Melatonin. Continue therapy, which is offering a lot of benefit.  Does get seasonal symptoms, which concerns her.  Obtain a Happy Light and use this during the fall and winter months daily.  Educated her on medication and use + side effects. Have recommended if any worsening symptoms to alert provider immediately.        Follow up plan: Return in about 5 weeks (around 09/26/2023) for Depression, ANXIETY.

## 2023-08-26 ENCOUNTER — Other Ambulatory Visit: Payer: Self-pay | Admitting: Nurse Practitioner

## 2023-08-26 ENCOUNTER — Encounter: Payer: Self-pay | Admitting: Nurse Practitioner

## 2023-08-26 MED ORDER — DULOXETINE HCL 30 MG PO CPEP: 30.0000 mg | ORAL_CAPSULE | Freq: Every day | ORAL | 0 refills | Status: DC

## 2023-09-09 ENCOUNTER — Ambulatory Visit (INDEPENDENT_AMBULATORY_CARE_PROVIDER_SITE_OTHER): Admitting: Clinical

## 2023-09-09 DIAGNOSIS — F331 Major depressive disorder, recurrent, moderate: Secondary | ICD-10-CM

## 2023-09-09 NOTE — Progress Notes (Signed)
 [facility] Counselor/Therapist Progress Note  Patient ID: Marie Perry, MRN: [id number],    Date: 09/09/2023  Time Spent: 10:35am - 11:34am : 59 minutes   Treatment Type: Individual Therapy  Reported Symptoms: fatigue, sadness  Mental Status Exam: Appearance:  Neat and Well Groomed     Behavior: Appropriate  Motor: Normal  Speech/Language:  Clear and Coherent and Normal Rate  Affect: Tearful  Mood: sad  Thought process: normal  Thought content:   WNL  Sensory/Perceptual disturbances:   WNL  Orientation: oriented to person, place, time/date, and situation  Attention: Good  Concentration: Good  Memory: WNL  Fund of knowledge:  Good  Insight:   Good  Judgment:  Good  Impulse Control: Good   Risk Assessment: Danger to Self:  No Patient denied current suicidal ideation  Self-injurious Behavior: No Danger to Others: No Patient denied current homicidal ideation Duty to Warn:no Physical Aggression / Violence:No  Access to Firearms a concern: No  Gang Involvement:No   Subjective: Patient stated, I might not be as spunky today and reported experiencing an upset stomach yesterday. Patient reported patient's PCP is tapering patient off of duloxetine  and plans to start patient on Abilify .  Patient stated, I could tell this past week if I had a thought about anything sad I was crying, I worked real hard to switch thoughts in response to recent decrease in dosage of duloxetine . Patient stated, I think I'm doing very good now in response to patient's current mood. Patient reported increased fatigue and reported napping at times. Patient stated, the motivation to do stuff is still a big thing for me and reported decreased motivation in response to patient's long term goal. Patient stated, 2 to 3 days a week I'm feeling pretty good in response to patient's long term goal. Patient stated, I feel better but I know if I getting to thinking too long about  something its like I keep relieving it in response to patient's first short term goal. Patient stated, in some ways I feel like I'm doing a lot better, I'm obviously not in response to patient's second short term goal. Patient stated, that's hard to do and stated, its very hard to catch myself in regards to negative thoughts in response to patient's third short term goal. Patient stated, maybe I've got a third of that in response to patient's fourth short term goal.   Interventions: Cognitive Behavioral Therapy. Clinician conducted session via caregility video from clinician's home office. Patient provided verbal consent to proceed with telehealth session and is aware of limitations of telephone or video visits. Patient participated in session from patient's home. Reviewed events since last session and assessed for changes. Discussed changes in patient's medication and patient's response. Provided psycho education related to treatment of depression and psychotropic medications. Reviewed patient's goals and patient's progress. Provided psycho education related to the impact on Alzheimer's Disease on the brain. Provided resource information regarding Alzheimer's Disease. Clinician requested for homework patient continue thought record and practice challenging cognitive distortions.     Collaboration of Care: Other not required at this time   Diagnosis:  Major depressive disorder, recurrent episode, moderate with anxious distress     Plan: Patient is to utilize [employer] Therapy, thought re-framing, behavioral activation, and coping strategies to decrease symptoms associated with their diagnosis. Frequency: bi-weekly  Modality: individual      Long-term goal:   Reduce overall level, frequency, and intensity of the feelings of depression as evidenced by decreased crying,  difficulty falling asleep and staying asleep, changes in appetite, lack of energy, fatigue, lack of motivation,  difficulty focusing, and loss of interest from 7 days/week to 2 to 3 days/week per patient report for at least 3 consecutive months. Target Date: 09/06/2024  Progress: progressing    Short-term goal:  Begin a healthy grieving process around the loss of patient's mother Target Date: 2024/09/06  Progress: progressing    Decrease negative thoughts associated with patient's mother's death Target Date: Sep 06, 2024  Progress: progressing    Identify, challenge, and replace negative thought patterns that contribute to feelings of depression with positive thoughts and beliefs per patient's report Target Date: 2024-09-06  Progress: progressing    Increase activities that patient enjoys to support increased motivation and mood, such as, socializing with friends weekly, completing puzzles, playing games. Target Date: 06-Sep-2024  Progress: progressing     [name], LCSW

## 2023-09-09 NOTE — Progress Notes (Signed)
   Marie Barthel, LCSW

## 2023-09-14 ENCOUNTER — Other Ambulatory Visit: Payer: Self-pay | Admitting: Nurse Practitioner

## 2023-09-15 ENCOUNTER — Other Ambulatory Visit: Payer: Self-pay | Admitting: Nurse Practitioner

## 2023-09-16 NOTE — Telephone Encounter (Signed)
 Requested medication (s) are due for refill today: na   Requested medication (s) are on the active medication list: yes   Last refill:  07/19/23   Future visit scheduled: yes 10/10/23  Notes to clinic:  do you want to refill Rx?      Requested Prescriptions  Pending Prescriptions Disp Refills   rosuvastatin  (CRESTOR ) 10 MG tablet [Pharmacy Med Name: ROSUVASTATIN  CALCIUM  10 MG TAB] 90 tablet 4    Sig: TAKE 1 TABLET BY MOUTH EVERY DAY     Cardiovascular:  Antilipid - Statins 2 Failed - 09/16/2023  3:30 PM      Failed - Lipid Panel in normal range within the last 12 months    Cholesterol, Total  Date Value Ref Range Status  07/19/2023 160 100 - 199 mg/dL Final   LDL Chol Calc (NIH)  Date Value Ref Range Status  07/19/2023 82 0 - 99 mg/dL Final   HDL  Date Value Ref Range Status  07/19/2023 57 >39 mg/dL Final   Triglycerides  Date Value Ref Range Status  07/19/2023 117 0 - 149 mg/dL Final         Passed - Cr in normal range and within 360 days    Creatinine, Ser  Date Value Ref Range Status  07/19/2023 0.88 0.57 - 1.00 mg/dL Final         Passed - Patient is not pregnant      Passed - Valid encounter within last 12 months    Recent Outpatient Visits           3 weeks ago Severe episode of recurrent major depressive disorder, without psychotic features (HCC)   [place] [facility] [place], Jolene T, NP   1 month ago Severe episode of recurrent major depressive disorder, without psychotic features (HCC)   [place] [facility] [place], Melanie DASEN, NP

## 2023-09-17 NOTE — Telephone Encounter (Signed)
 Requested Prescriptions  Pending Prescriptions Disp Refills   buPROPion  (WELLBUTRIN  XL) 300 MG 24 hr tablet [Pharmacy Med Name: BUPROPION  HCL XL 300 MG TABLET] 90 tablet 0    Sig: TAKE 1 TABLET BY MOUTH EVERY DAY     Psychiatry: Antidepressants - bupropion  Passed - 09/17/2023  9:06 AM      Passed - Cr in normal range and within 360 days    Creatinine, Ser  Date Value Ref Range Status  07/19/2023 0.88 0.57 - 1.00 mg/dL Final         Passed - AST in normal range and within 360 days    AST  Date Value Ref Range Status  07/19/2023 13 0 - 40 IU/L Final         Passed - ALT in normal range and within 360 days    ALT  Date Value Ref Range Status  07/19/2023 14 0 - 32 IU/L Final         Passed - Completed PHQ-2 or PHQ-9 in the last 360 days      Passed - Last BP in normal range    BP Readings from Last 1 Encounters:  08/22/23 98/62         Passed - Valid encounter within last 6 months    Recent Outpatient Visits           3 weeks ago Severe episode of recurrent major depressive disorder, without psychotic features (HCC)   [place] [facility] [place], [place] T, NP   2 months ago Severe episode of recurrent major depressive disorder, without psychotic features (HCC)   [place] [facility] [place], Jolene T, NP               ibandronate  (BONIVA ) 150 MG tablet [Pharmacy Med Name: IBANDRONATE  SODIUM 150 MG TAB] 3 tablet 0    Sig: TAKE 1 TABLET (150 MG TOTAL) BY MOUTH EVERY 30 (THIRTY) DAYS. TAKE IN THE MORNING WITH A FULL GLASS OF WATER, ON AN EMPTY STOMACH, AND DO NOT TAKE ANYTHING ELSE BY MOUTH OR LIE DOWN FOR THE NEXT 30 MIN.     Endocrinology:  Bisphosphonates Failed - 09/17/2023  9:06 AM      Failed - Mg Level in normal range and within 360 days    Magnesium  Date Value Ref Range Status  06/12/2021 2.2 1.6 - 2.3 mg/dL Final         Failed - Phosphate in normal range and within 360 days    No results found for: PHOS       Passed - Ca  in normal range and within 360 days    Calcium   Date Value Ref Range Status  07/19/2023 9.2 8.7 - 10.3 mg/dL Final         Passed - Vitamin D  in normal range and within 360 days    Vit D, 25-Hydroxy  Date Value Ref Range Status  07/19/2023 42.9 30.0 - 100.0 ng/mL Final    Comment:    Vitamin D  deficiency has been defined by the Institute of Medicine and an Endocrine Society practice guideline as a level of serum 25-OH vitamin D  less than 20 ng/mL (1,2). The Endocrine Society went on to further define vitamin D  insufficiency as a level between 21 and 29 ng/mL (2). 1. IOM (Institute of Medicine). 2010. Dietary reference    intakes for calcium  and D. [place]: The    [employer]. 2. Holick MF, Binkley , Bischoff-Ferrari HA, et  al.    Evaluation, treatment, and prevention of vitamin D     deficiency: an Endocrine Society clinical practice    guideline. JCEM. 2011 Jul; 96(7):1911-30.          Passed - Cr in normal range and within 360 days    Creatinine, Ser  Date Value Ref Range Status  07/19/2023 0.88 0.57 - 1.00 mg/dL Final         Passed - eGFR is 30 or above and within 360 days    GFR calc Af Amer  Date Value Ref Range Status  05/08/2019 70 >59 mL/min/1.73 Final   GFR calc non Af Amer  Date Value Ref Range Status  05/08/2019 61 >59 mL/min/1.73 Final   eGFR  Date Value Ref Range Status  07/19/2023 70 >59 mL/min/1.73 Final         Passed - Valid encounter within last 12 months    Recent Outpatient Visits           3 weeks ago Severe episode of recurrent major depressive disorder, without psychotic features (HCC)   [place] [facility] [place], [place] T, NP   2 months ago Severe episode of recurrent major depressive disorder, without psychotic features (HCC)   [place] [facility] [place], [place] T, NP              Passed - Bone Mineral Density or Dexa Scan completed in the last 2 years

## 2023-09-17 NOTE — Telephone Encounter (Signed)
 Requested medication (s) are due for refill today: no  Requested medication (s) are on the active medication list: yes  Last refill:  08/22/23 #30 5 RF  Future visit scheduled: yes  Notes to clinic:  med not delegated to NT to RF   Requested Prescriptions  Pending Prescriptions Disp Refills   ARIPiprazole  (ABILIFY ) 2 MG tablet [Pharmacy Med Name: ARIPIPRAZOLE  2 MG TABLET] 90 tablet 2    Sig: TAKE 1 TABLET BY MOUTH EVERY DAY     Not Delegated - Psychiatry:  Antipsychotics - Second Generation (Atypical) - aripiprazole  Failed - 09/17/2023 10:04 AM      Failed - This refill cannot be delegated      Failed - Lipid Panel in normal range within the last 12 months    Cholesterol, Total  Date Value Ref Range Status  07/19/2023 160 100 - 199 mg/dL Final   LDL Chol Calc (NIH)  Date Value Ref Range Status  07/19/2023 82 0 - 99 mg/dL Final   HDL  Date Value Ref Range Status  07/19/2023 57 >39 mg/dL Final   Triglycerides  Date Value Ref Range Status  07/19/2023 117 0 - 149 mg/dL Final         Passed - TSH in normal range and within 360 days    TSH  Date Value Ref Range Status  07/19/2023 1.870 0.450 - 4.500 uIU/mL Final         Passed - Completed PHQ-2 or PHQ-9 in the last 360 days      Passed - Last BP in normal range    BP Readings from Last 1 Encounters:  08/22/23 98/62         Passed - Last Heart Rate in normal range    Pulse Readings from Last 1 Encounters:  08/22/23 76         Passed - Valid encounter within last 6 months    Recent Outpatient Visits           3 weeks ago Severe episode of recurrent major depressive disorder, without psychotic features (HCC)   [place] [facility] [place], Jolene T, NP   2 months ago Severe episode of recurrent major depressive disorder, without psychotic features (HCC)   [facility] [place], [place] T, NP              Passed - CBC within normal limits and completed in the last 12  months    WBC  Date Value Ref Range Status  07/19/2023 5.2 3.4 - 10.8 x10E3/uL Final   RBC  Date Value Ref Range Status  07/19/2023 4.45 3.77 - 5.28 x10E6/uL Final   Hemoglobin  Date Value Ref Range Status  07/19/2023 13.5 11.1 - 15.9 g/dL Final   Hematocrit  Date Value Ref Range Status  07/19/2023 41.8 34.0 - 46.6 % Final   MCHC  Date Value Ref Range Status  07/19/2023 32.3 31.5 - 35.7 g/dL Final   [facility]  Date Value Ref Range Status  07/19/2023 30.3 26.6 - 33.0 pg Final   MCV  Date Value Ref Range Status  07/19/2023 94 79 - 97 fL Final   No results found for: PLTCOUNTKUC, LABPLAT, POCPLA RDW  Date Value Ref Range Status  07/19/2023 12.9 11.7 - 15.4 % Final         Passed - CMP within normal limits and completed in the last 12 months    Albumin  Date Value Ref Range Status  07/19/2023 4.2 3.8 -  4.8 g/dL Final   Alkaline Phosphatase  Date Value Ref Range Status  07/19/2023 78 44 - 121 IU/L Final   ALT  Date Value Ref Range Status  07/19/2023 14 0 - 32 IU/L Final   AST  Date Value Ref Range Status  07/19/2023 13 0 - 40 IU/L Final   BUN  Date Value Ref Range Status  07/19/2023 16 8 - 27 mg/dL Final   Calcium   Date Value Ref Range Status  07/19/2023 9.2 8.7 - 10.3 mg/dL Final   CO2  Date Value Ref Range Status  07/19/2023 21 20 - 29 mmol/L Final   Creatinine, Ser  Date Value Ref Range Status  07/19/2023 0.88 0.57 - 1.00 mg/dL Final   Glucose  Date Value Ref Range Status  07/19/2023 96 70 - 99 mg/dL Final   Potassium  Date Value Ref Range Status  07/19/2023 4.0 3.5 - 5.2 mmol/L Final   Sodium  Date Value Ref Range Status  07/19/2023 139 134 - 144 mmol/L Final   Bilirubin Total  Date Value Ref Range Status  07/19/2023 0.4 0.0 - 1.2 mg/dL Final   Protein,UA  Date Value Ref Range Status  09/12/2021 Negative Negative/Trace Final   Total Protein  Date Value Ref Range Status  07/19/2023 6.1 6.0 - 8.5 g/dL Final   GFR calc Af Amer   Date Value Ref Range Status  05/08/2019 70 >59 mL/min/1.73 Final   eGFR  Date Value Ref Range Status  07/19/2023 70 >59 mL/min/1.73 Final   GFR calc non Af Amer  Date Value Ref Range Status  05/08/2019 61 >59 mL/min/1.73 Final

## 2023-09-22 ENCOUNTER — Other Ambulatory Visit: Payer: Self-pay | Admitting: Nurse Practitioner

## 2023-09-23 ENCOUNTER — Ambulatory Visit (INDEPENDENT_AMBULATORY_CARE_PROVIDER_SITE_OTHER): Admitting: Clinical

## 2023-09-23 DIAGNOSIS — F331 Major depressive disorder, recurrent, moderate: Secondary | ICD-10-CM

## 2023-09-23 NOTE — Progress Notes (Signed)
 [facility] Counselor/Therapist Progress Note  Patient ID: Marie Perry, MRN: [id number],    Date: 09/23/2023  Time Spent: 10:36am - 11:26am : 50 minutes   Treatment Type: Individual Therapy  Reported Symptoms: fatigue, increased sleep  Mental Status Exam: Appearance:  Neat and Well Groomed     Behavior: Drowsy  Motor: Normal  Speech/Language:  Clear and Coherent and Normal Rate  Affect: Appropriate  Mood: normal  Thought process: normal  Thought content:   WNL  Sensory/Perceptual disturbances:   WNL  Orientation: oriented to person, place, time/date, and situation  Attention: Good  Concentration: Good  Memory: WNL  Fund of knowledge:  Good  Insight:   Good  Judgment:  Good  Impulse Control: Good   Risk Assessment: Danger to Self:  No Patient denied current suicidal ideation  Self-injurious Behavior: No Danger to Others: No Patient denied current homicidal ideation Duty to Warn:no Physical Aggression / Violence:No  Access to Firearms a concern: No  Gang Involvement:No   Subjective: Patient stated, I've been busy in response to events since last session. Patient reported patient has been working on several home repair projects since last session. Patient stated, still in process in reference to tapering off duloxetine . Patient stated, seem to be doing ok in response to patient's mood. Patient reported a decrease in crying since last session. Patient stated, I did not realize depression itself can make you want to sleep. Patient reported I have felt so sleepy, I have been forcing myself to do things like get up and try to catch my laundry up.  Patient reported patient has been trying to go to bed at the same time each night. Patient reported patient has a follow up appointment with PCP in August. Patient stated, I feel like I'm wound up and racing through thoughts in reference to thoughts at night. Patient reported patient set a device to play  upbeat music and play an encouraging statement when patient wakes up in the morning. Patient stated, that sounds lovely in response to healthy sleep hygiene strategies. Patient stated, I just don't have any desire to do some stuff even if it's fun.   Interventions: Cognitive Behavioral Therapy. Clinician conducted session via caregility video from clinician's home office. Patient provided verbal consent to proceed with telehealth session and is aware of limitations of telephone or video visits. Patient participated in session from patient's home. Reviewed events since last session and assessed for changes. Discussed changes in medication and depressive symptoms. Discussed patient following up with PCP to discuss increase in depressive symptoms. Provided psycho education related to depressive symptoms. Provided psycho education related to healthy sleep hygiene. Discussed strategies to increase positive activities. Clinician requested for homework patient continue thought record and practice challenging cognitive distortions.    Collaboration of Care: Other not required at this time   Diagnosis:  Major depressive disorder, recurrent episode, moderate with anxious distress     Plan: Patient is to utilize [employer] Therapy, thought re-framing, behavioral activation, and coping strategies to decrease symptoms associated with their diagnosis. Frequency: bi-weekly  Modality: individual      Long-term goal:   Reduce overall level, frequency, and intensity of the feelings of depression as evidenced by decreased crying, difficulty falling asleep and staying asleep, changes in appetite, lack of energy, fatigue, lack of motivation, difficulty focusing, and loss of interest from 7 days/week to 2 to 3 days/week per patient report for at least 3 consecutive months. Target Date: 08/16/24  Progress: progressing  Short-term goal:  Begin a healthy grieving process around the loss of patient's  mother Target Date: 2024/08/31  Progress: progressing    Decrease negative thoughts associated with patient's mother's death Target Date: 08-31-2024  Progress: progressing    Identify, challenge, and replace negative thought patterns that contribute to feelings of depression with positive thoughts and beliefs per patient's report Target Date: 08-31-24  Progress: progressing    Increase activities that patient enjoys to support increased motivation and mood, such as, socializing with friends weekly, completing puzzles, playing games. Target Date: 08-31-2024  Progress: progressing    [name], LCSW

## 2023-09-23 NOTE — Progress Notes (Signed)
   [name], LCSW

## 2023-09-24 ENCOUNTER — Encounter: Payer: Self-pay | Admitting: Nurse Practitioner

## 2023-09-25 NOTE — Telephone Encounter (Signed)
 Requested medication (s) are due for refill today: yes  Requested medication (s) are on the active medication list: yes  Last refill:  08/26/23 #30  Future visit scheduled: yes  Notes to clinic:  Med being weaned- was not sure if dosage needed adjusting.    Requested Prescriptions  Pending Prescriptions Disp Refills   DULoxetine  (CYMBALTA ) 30 MG capsule [Pharmacy Med Name: DULOXETINE  HCL DR 30 MG CAP] 90 capsule 1    Sig: TAKE 1 CAPSULE BY MOUTH EVERY DAY     Psychiatry: Antidepressants - SNRI - duloxetine  Passed - 09/25/2023  3:32 PM      Passed - Cr in normal range and within 360 days    Creatinine, Ser  Date Value Ref Range Status  07/19/2023 0.88 0.57 - 1.00 mg/dL Final         Passed - eGFR is 30 or above and within 360 days    GFR calc Af Amer  Date Value Ref Range Status  05/08/2019 70 >59 mL/min/1.73 Final   GFR calc non Af Amer  Date Value Ref Range Status  05/08/2019 61 >59 mL/min/1.73 Final   eGFR  Date Value Ref Range Status  07/19/2023 70 >59 mL/min/1.73 Final         Passed - Completed PHQ-2 or PHQ-9 in the last 360 days      Passed - Last BP in normal range    BP Readings from Last 1 Encounters:  08/22/23 98/62         Passed - Valid encounter within last 6 months    Recent Outpatient Visits           1 month ago Severe episode of recurrent major depressive disorder, without psychotic features (HCC)   [place] [facility] [place], Jolene T, NP   2 months ago Severe episode of recurrent major depressive disorder, without psychotic features (HCC)   [place] [facility] [place], Melanie DASEN, NP

## 2023-10-06 NOTE — Patient Instructions (Incomplete)
 Be Involved in Caring For Your Health:  Taking Medications When medications are taken as directed, they can greatly improve your health. But if they are not taken as prescribed, they may not work. In some cases, not taking them correctly can be harmful. To help ensure your treatment remains effective and safe, understand your medications and how to take them. Bring your medications to each visit for review by your provider.  Your lab results, notes, and after visit summary will be available on My Chart. We strongly encourage you to use this feature. If lab results are abnormal the clinic will contact you with the appropriate steps. If the clinic does not contact you assume the results are satisfactory. You can always view your results on My Chart. If you have questions regarding your health or results, please contact the clinic during office hours. You can also ask questions on My Chart.  We at [facility] are grateful that you chose us  to provide your care. We strive to provide evidence-based and compassionate care and are always looking for feedback. If you get a survey from the clinic please complete this so we can hear your opinions.  Managing Depression, Adult Depression is a mental health condition that affects your thoughts, feelings, and actions. Being diagnosed with depression can bring you relief if you did not know why you have felt or behaved a certain way. It could also leave you feeling overwhelmed. Finding ways to manage your symptoms can help you feel more positive about your future. How to manage lifestyle changes Being depressed is difficult. Depression can increase the level of everyday stress. Stress can make depression symptoms worse. You may believe your symptoms cannot be managed or will never improve. However, there are many things you can try to help manage your symptoms. There is hope. Managing stress  Stress is your body's reaction to life changes and events,  both good and bad. Stress can add to your feelings of depression. Learning to manage your stress can help lessen your feelings of depression. Try some of the following approaches to reducing your stress (stress reduction techniques): Listen to music that you enjoy and that inspires you. Try using a meditation app or take a meditation class. Develop a practice that helps you connect with your spiritual self. Walk in nature, pray, or go to a place of worship. Practice deep breathing. To do this, inhale slowly through your nose. Pause at the top of your inhale for a few seconds and then exhale slowly, letting yourself relax. Repeat this three or four times. Practice yoga to help relax and work your muscles. Choose a stress reduction technique that works for you. These techniques take time and practice to develop. Set aside 5-15 minutes a day to do them. Therapists can offer training in these techniques. Do these things to help manage stress: Keep a journal. Know your limits. Set healthy boundaries for yourself and others, such as saying no when you think something is too much. Pay attention to how you react to certain situations. You may not be able to control everything, but you can change your reaction. Add humor to your life by watching funny movies or shows. Make time for activities that you enjoy and that relax you. Spend less time using electronics, especially at night before bed. The light from screens can make your brain think it is time to get up rather than go to bed.  Medicines Medicines, such as antidepressants, are often a part of  treatment for depression. Talk with your pharmacist or health care provider about all the medicines, supplements, and herbal products that you take, their possible side effects, and what medicines and other products are safe to take together. Make sure to report any side effects you may have to your health care provider. Relationships Your health care  provider may suggest family therapy, couples therapy, or individual therapy as part of your treatment. How to recognize changes Everyone responds differently to treatment for depression. As you recover from depression, you may start to: Have more interest in doing activities. Feel more hopeful. Have more energy. Eat a more regular amount of food. Have better mental focus. It is important to recognize if your depression is not getting better or is getting worse. The symptoms you had in the beginning may return, such as: Feeling tired. Eating too much or too little. Sleeping too much or too little. Feeling restless, agitated, or hopeless. Trouble focusing or making decisions. Having unexplained aches and pains. Feeling irritable, angry, or aggressive. If you or your family members notice these symptoms coming back, let your health care provider know right away. Follow these instructions at home: Activity Try to get some form of exercise each day, such as walking. Try yoga, mindfulness, or other stress reduction techniques. Participate in group activities if you are able. Lifestyle Get enough sleep. Cut down on or stop using caffeine, tobacco, alcohol, and any other harmful substances. Eat a healthy diet that includes plenty of vegetables, fruits, whole grains, low-fat dairy products, and lean protein. Limit foods that are high in solid fats, added sugar, or salt (sodium). General instructions Take over-the-counter and prescription medicines only as told by your health care provider. Keep all follow-up visits. It is important for your health care provider to check on your mood, behavior, and medicines. Your health care provider may need to make changes to your treatment. Where to find support Talking to others  Friends and family members can be sources of support and guidance. Talk to trusted friends or family members about your condition. Explain your symptoms and let them know that you  are working with a health care provider to treat your depression. Tell friends and family how they can help. Finances Find mental health providers that fit with your financial situation. Talk with your health care provider if you are worried about access to food, housing, or medicine. Call your insurance company to learn about your co-pays and prescription plan. Where to find more information You can find support in your area from: Anxiety and Depression Association of America (ADAA): adaa.org Mental Health America: mentalhealthamerica.net The [employer] on Mental Illness: nami.org Contact a health care provider if: You stop taking your antidepressant medicines, and you have any of these symptoms: Nausea. Headache. Light-headedness. Chills and body aches. Not being able to sleep (insomnia). You or your friends and family think your depression is getting worse. Get help right away if: You have thoughts of hurting yourself or others. Get help right away if you feel like you may hurt yourself or others, or have thoughts about taking your own life. Go to your nearest emergency room or: Call 911. Call the National Suicide Prevention Lifeline at (743) 630-7432 or 988. This is open 24 hours a day. Text the Crisis Text Line at 854-590-3828. This information is not intended to replace advice given to you by your health care provider. Make sure you discuss any questions you have with your health care provider. Document Revised: 06/06/2021 Document Reviewed:  06/06/2021 Elsevier Patient Education  2024 [employer].

## 2023-10-10 ENCOUNTER — Encounter: Payer: Self-pay | Admitting: Nurse Practitioner

## 2023-10-10 ENCOUNTER — Ambulatory Visit (INDEPENDENT_AMBULATORY_CARE_PROVIDER_SITE_OTHER): Admitting: Nurse Practitioner

## 2023-10-10 VITALS — BP 120/72 | HR 79 | Temp 98.0°F | Ht 66.0 in | Wt 191.2 lb

## 2023-10-10 DIAGNOSIS — F332 Major depressive disorder, recurrent severe without psychotic features: Secondary | ICD-10-CM

## 2023-10-10 NOTE — Assessment & Plan Note (Signed)
 Chronic, stable.  Loss of mother in January 2024, whom she lived with for 30 years.  Denies SI/HI.  Unable to get Rexulti  covered.  Continue Wellbutrin , Abilify , and Buspar .  Continue Melatonin. Continue therapy, which is offering a lot of benefit.  Does get seasonal symptoms, which concerns her.  Has Happy Light and plans to use this during the fall and winter months daily.  Educated her on medication and use + side effects. Have recommended if any worsening symptoms to alert provider immediately.

## 2023-10-10 NOTE — Progress Notes (Signed)
 BP 120/72   Pulse 79   Temp 98 F (36.7 C) (Oral)   Ht 5' 6 (1.676 m)   Wt 191 lb 3.2 oz (86.7 kg)   LMP  (LMP Unknown)   SpO2 98%   BMI 30.86 kg/m    Subjective:    Patient ID: Marie Perry, female    DOB: 26-Dec-1950, 73 y.o.   MRN: [id number]  HPI: Marie Perry is a 73 y.o. female  Chief Complaint  Patient presents with   Depression    Patient states that she is feeling better. Has more interest in doing things.    DEPRESSION Follow-up for mood today.  Attends therapy, last visit 09/23/23.  Taking Abilify , Wellbutrin , and Buspar . Stopped Duloxetine  due to sweats, noticing having less sweats. Could not get Rexulti , was not covered. Last saw therapist on 09/23/23.  Having less fatigue with change in medications. Mood status: improving, is noticing more motivation with Abilify  Satisfied with current treatment?: yes Symptom severity: moderate  Duration of current treatment : chronic Side effects: no Medication compliance: good compliance Psychotherapy/counseling: in past Previous psychiatric medications: Cymbalta , Wellbutrin , and Buspar  Depressed mood: occasional Anxious mood: improving with Abilify  Anhedonia: improving Significant weight loss or gain: no Insomnia: yes, difficulty falling asleep at times -- is working on a more consistent schedule and getting off electronics Fatigue: improving Feelings of worthlessness or guilt: no Impaired concentration/indecisiveness: only if excited Suicidal ideations: no Hopelessness: no Crying spells: occasional    10/10/2023   10:07 AM 08/22/2023    9:49 AM 07/19/2023    9:47 AM 04/16/2023    9:42 AM 12/27/2022   11:10 AM  Depression screen PHQ 2/9  Decreased Interest 1 1 1 3 1   Down, Depressed, Hopeless 1 1 2 1 1   PHQ - 2 Score 2 2 3 4 2   Altered sleeping 2 1 1 1 1   Tired, decreased energy 1 1 3 2 2   Change in appetite 0 1 1 1 1   Feeling bad or failure about yourself  0 1 1 0 1  Trouble concentrating 1 0 1 1  1   Moving slowly or fidgety/restless 0 0 0 0 1  Suicidal thoughts 0 0 0 0 0  PHQ-9 Score 6 6 10 9 9   Difficult doing work/chores Somewhat difficult Somewhat difficult Somewhat difficult Somewhat difficult Somewhat difficult       10/10/2023   10:07 AM 08/22/2023    9:49 AM 07/19/2023    9:44 AM 12/27/2022   11:11 AM  GAD 7 : Generalized Anxiety Score  Nervous, Anxious, on Edge 1 1 1 1   Control/stop worrying 0 0 1 1  Worry too much - different things 0 0 1 0  Trouble relaxing 0 1 0 1  Restless 0 0 0 0  Easily annoyed or irritable 0 0 1 0  Afraid - awful might happen 0 0 0 1  Total GAD 7 Score 1 2 4 4   Anxiety Difficulty Somewhat difficult Not difficult at all Not difficult at all Not difficult at all   Relevant past medical, surgical, family and social history reviewed and updated as indicated. Interim medical history since our last visit reviewed. Allergies and medications reviewed and updated.  Review of Systems  Constitutional:  Negative for activity change, appetite change, diaphoresis, fatigue and fever.  Respiratory:  Negative for cough, chest tightness and shortness of breath.   Cardiovascular:  Negative for chest pain, palpitations and leg swelling.  Gastrointestinal: Negative.   Neurological: Negative.  Psychiatric/Behavioral:  Positive for decreased concentration and sleep disturbance. Negative for self-injury and suicidal ideas. The patient is nervous/anxious.     Per HPI unless specifically indicated above     Objective:    BP 120/72   Pulse 79   Temp 98 F (36.7 C) (Oral)   Ht 5' 6 (1.676 m)   Wt 191 lb 3.2 oz (86.7 kg)   LMP  (LMP Unknown)   SpO2 98%   BMI 30.86 kg/m   Wt Readings from Last 3 Encounters:  10/10/23 191 lb 3.2 oz (86.7 kg)  08/22/23 196 lb (88.9 kg)  07/19/23 194 lb 12.8 oz (88.4 kg)    Physical Exam Vitals and nursing note reviewed.  Constitutional:      General: She is awake. She is not in acute distress.    Appearance: She is  well-developed and well-groomed. She is obese. She is not ill-appearing or toxic-appearing.  HENT:     Head: Normocephalic.     Right Ear: Hearing and external ear normal.     Left Ear: Hearing and external ear normal.  Eyes:     General: Lids are normal.        Right eye: No discharge.        Left eye: No discharge.     Conjunctiva/sclera: Conjunctivae normal.     Pupils: Pupils are equal, round, and reactive to light.  Neck:     Thyroid : No thyromegaly.     Vascular: No carotid bruit.  Cardiovascular:     Rate and Rhythm: Normal rate and regular rhythm.     Heart sounds: Normal heart sounds. No murmur heard.    No gallop.  Pulmonary:     Effort: Pulmonary effort is normal. No accessory muscle usage or respiratory distress.     Breath sounds: Normal breath sounds.  Abdominal:     General: Bowel sounds are normal. There is no distension.     Palpations: Abdomen is soft.     Tenderness: There is no abdominal tenderness.  Musculoskeletal:     Cervical back: Normal range of motion and neck supple.     Right lower leg: No edema.     Left lower leg: No edema.  Lymphadenopathy:     Cervical: No cervical adenopathy.  Skin:    General: Skin is warm and dry.  Neurological:     Mental Status: She is alert and oriented to person, place, and time.     Deep Tendon Reflexes: Reflexes are normal and symmetric.     Reflex Scores:      Brachioradialis reflexes are 2+ on the right side and 2+ on the left side.      Patellar reflexes are 2+ on the right side and 2+ on the left side. Psychiatric:        Attention and Perception: Attention normal.        Mood and Affect: Mood normal.        Speech: Speech normal.        Behavior: Behavior normal. Behavior is cooperative.        Thought Content: Thought content normal.    Results for orders placed or performed in visit on 07/19/23  CBC with Differential/Platelet   Collection Time: 07/19/23 10:39 AM  Result Value Ref Range   WBC 5.2 3.4 -  10.8 x10E3/uL   RBC 4.45 3.77 - 5.28 x10E6/uL   Hemoglobin 13.5 11.1 - 15.9 g/dL   Hematocrit 58.1 65.9 - 46.6 %  MCV 94 79 - 97 fL   MCH 30.3 26.6 - 33.0 pg   MCHC 32.3 31.5 - 35.7 g/dL   RDW 87.0 88.2 - 84.5 %   Platelets 187 150 - 450 x10E3/uL   Neutrophils 74 Not Estab. %   Lymphs 14 Not Estab. %   Monocytes 9 Not Estab. %   Eos 2 Not Estab. %   Basos 1 Not Estab. %   Neutrophils Absolute 3.9 1.4 - 7.0 x10E3/uL   Lymphocytes Absolute 0.7 0.7 - 3.1 x10E3/uL   Monocytes Absolute 0.5 0.1 - 0.9 x10E3/uL   EOS (ABSOLUTE) 0.1 0.0 - 0.4 x10E3/uL   Basophils Absolute 0.1 0.0 - 0.2 x10E3/uL   Immature Granulocytes 0 Not Estab. %   Immature Grans (Abs) 0.0 0.0 - 0.1 x10E3/uL  Comprehensive metabolic panel with GFR   Collection Time: 07/19/23 10:39 AM  Result Value Ref Range   Glucose 96 70 - 99 mg/dL   BUN 16 8 - 27 mg/dL   Creatinine, Ser 9.11 0.57 - 1.00 mg/dL   eGFR 70 >40 fO/fpw/8.26   BUN/Creatinine Ratio 18 12 - 28   Sodium 139 134 - 144 mmol/L   Potassium 4.0 3.5 - 5.2 mmol/L   Chloride 103 96 - 106 mmol/L   CO2 21 20 - 29 mmol/L   Calcium  9.2 8.7 - 10.3 mg/dL   Total Protein 6.1 6.0 - 8.5 g/dL   Albumin 4.2 3.8 - 4.8 g/dL   Globulin, Total 1.9 1.5 - 4.5 g/dL   Bilirubin Total 0.4 0.0 - 1.2 mg/dL   Alkaline Phosphatase 78 44 - 121 IU/L   AST 13 0 - 40 IU/L   ALT 14 0 - 32 IU/L  Lipid Panel w/o Chol/HDL Ratio   Collection Time: 07/19/23 10:39 AM  Result Value Ref Range   Cholesterol, Total 160 100 - 199 mg/dL   Triglycerides 882 0 - 149 mg/dL   HDL 57 >60 mg/dL   VLDL Cholesterol Cal 21 5 - 40 mg/dL   LDL Chol Calc (NIH) 82 0 - 99 mg/dL  TSH   Collection Time: 07/19/23 10:39 AM  Result Value Ref Range   TSH 1.870 0.450 - 4.500 uIU/mL  Vitamin B12   Collection Time: 07/19/23 10:39 AM  Result Value Ref Range   Vitamin B-12 664 232 - 1,245 pg/mL  VITAMIN D  25 Hydroxy (Vit-D Deficiency, Fractures)   Collection Time: 07/19/23 10:39 AM  Result Value Ref Range    Vit D, 25-Hydroxy 42.9 30.0 - 100.0 ng/mL      Assessment & Plan:   Problem List Items Addressed This Visit       Other   Recurrent major depression-severe (HCC) - Primary   Chronic, stable.  Loss of mother in January 2024, whom she lived with for 30 years.  Denies SI/HI.  Unable to get Rexulti  covered.  Continue Wellbutrin , Abilify , and Buspar .  Continue Melatonin. Continue therapy, which is offering a lot of benefit.  Does get seasonal symptoms, which concerns her.  Has Happy Light and plans to use this during the fall and winter months daily.  Educated her on medication and use + side effects. Have recommended if any worsening symptoms to alert provider immediately.         Follow up plan: Return in about 3 months (around 01/20/2024) for HLD, Osteoporosis, Depression.

## 2023-10-11 ENCOUNTER — Ambulatory Visit: Admitting: Clinical

## 2023-10-11 DIAGNOSIS — F331 Major depressive disorder, recurrent, moderate: Secondary | ICD-10-CM

## 2023-10-11 NOTE — Progress Notes (Signed)
   [name], LCSW

## 2023-10-11 NOTE — Progress Notes (Signed)
 [facility] Counselor/Therapist Progress Note  Patient ID: Marie Perry, MRN: [id number],    Date: 10/11/2023  Time Spent: 10:34am - 11:23am : 49 minutes   Treatment Type: Individual Therapy  Reported Symptoms: decreased sleep/interrupted sleep  Mental Status Exam: Appearance:  Neat and Well Groomed     Behavior: Appropriate  Motor: Normal  Speech/Language:  Clear and Coherent and Normal Rate  Affect: Tearful when discussing patient's mother  Mood: normal  Thought process: normal  Thought content:   WNL  Sensory/Perceptual disturbances:   WNL  Orientation: oriented to person, place, time/date, and situation  Attention: Good  Concentration: Good  Memory: WNL  Fund of knowledge:  Good  Insight:   Good  Judgment:  Good  Impulse Control: Good   Risk Assessment: Danger to Self:  No Patient denied current suicidal ideation  Self-injurious Behavior: No Danger to Others: No Patient denied current homicidal ideation Duty to Warn:no Physical Aggression / Violence:No  Access to Firearms a concern: No  Gang Involvement:No   Subjective: Patient reported a recent appointment with patient's PCP and reported depressive symptoms were discussed during appointment. Patient reported patient has started new medication, Abilify . Patient reported an increase in motivation and reported patient has been able to complete additional tasks since starting new medication. Patient reported becoming tearful at times when thinking about or discussing patient's mother and patient reported patient distracts herself with an activity. Patient reported tearfulness/crying occurs less frequent with new medication. Patient reported patient continues to experience difficulty sleeping and reported sleep is interrupted sleep. Patient stated, I feel like I have to take a nap so I'm clear headed.  Patient stated, I was getting sleepy at 10 pm and tried to read a physical book. Patient reported  listening to brown noise to promote sleep. Patient reported patient continues to experience pain/discomfort in patient's shoulder. Patient reported patient prefers to sleep on patient's right side and reported discomfort when sleeping on patient's right side due to shoulder pain. Patient stated, fine in response to mood since last session.   Interventions: Cognitive Behavioral Therapy. Clinician conducted session via caregility video from clinician's home office. Patient provided verbal consent to proceed with telehealth session and is aware of limitations of telephone or video visits. Patient participated in session from patient's home. Reviewed events since last session and assessed for changes. Discussed recent appointment with PCP and medication change.  Explored changes in symptoms in response to new medication. Reviewed healthy sleep hygiene and strategies patient has implemented since last session. Reviewed imagery. Provided psycho education related to progressive muscle relaxation. Discussed additional strategies to promote sleep, such as, listening to relaxing/calming sounds. Explored and identified barriers to sleep. Discussed patient following up with physician regarding shoulder pain. Clinician requested for homework patient continue thought record and practice challenging cognitive distortions.   Collaboration of Care: Other not required at this time   Diagnosis:  Major depressive disorder, recurrent episode, moderate with anxious distress     Plan: Patient is to utilize [employer] [facility], thought re-framing, behavioral activation, and coping strategies to decrease symptoms associated with their diagnosis. Frequency: bi-weekly  Modality: individual      Long-term goal:   Reduce overall level, frequency, and intensity of the feelings of depression as evidenced by decreased crying, difficulty falling asleep and staying asleep, changes in appetite, lack of energy, fatigue, lack  of motivation, difficulty focusing, and loss of interest from 7 days/week to 2 to 3 days/week per patient report for at least  3 consecutive months. Target Date: 08/27/24  Progress: progressing    Short-term goal:  Begin a healthy grieving process around the loss of patient's mother Target Date: 2024-08-27  Progress: progressing    Decrease negative thoughts associated with patient's mother's death Target Date: 27-Aug-2024  Progress: progressing    Identify, challenge, and replace negative thought patterns that contribute to feelings of depression with positive thoughts and beliefs per patient's report Target Date: 08/27/2024  Progress: progressing    Increase activities that patient enjoys to support increased motivation and mood, such as, socializing with friends weekly, completing puzzles, playing games. Target Date: 08-27-24  Progress: progressing    [name], LCSW

## 2023-10-25 ENCOUNTER — Ambulatory Visit: Admitting: Clinical

## 2023-11-04 ENCOUNTER — Ambulatory Visit (INDEPENDENT_AMBULATORY_CARE_PROVIDER_SITE_OTHER): Admitting: Clinical

## 2023-11-04 DIAGNOSIS — F331 Major depressive disorder, recurrent, moderate: Secondary | ICD-10-CM | POA: Diagnosis not present

## 2023-11-04 NOTE — Progress Notes (Signed)
   [name], LCSW

## 2023-11-04 NOTE — Progress Notes (Signed)
 [facility] Counselor/Therapist Progress Note  Patient ID: Marie Perry, MRN: [id number],    Date: 11/04/2023  Time Spent: 10:35am - 11:35am : 60 minutes   Treatment Type: Individual Therapy  Reported Symptoms: crying/sadness  Mental Status Exam: Appearance:  Neat and Well Groomed     Behavior: Appropriate  Motor: Normal  Speech/Language:  Clear and Coherent and Normal Rate  Affect: Tearful  Mood: sad  Thought process: normal  Thought content:   WNL  Sensory/Perceptual disturbances:   WNL  Orientation: oriented to person, place, time/date, and situation  Attention: Good  Concentration: Good  Memory: WNL  Fund of knowledge:  Good  Insight:   Good  Judgment:  Good  Impulse Control: Good   Risk Assessment: Danger to Self:  No Patient denied current suicidal ideation  Self-injurious Behavior: No Danger to Others: No Patient denied current homicidal ideation Duty to Warn:no Physical Aggression / Violence:No  Access to Firearms a concern: No  Gang Involvement:No   Subjective: Patient stated, I'm tearing my house apart and patient reported patient is currently renovating patient's home. Patient reported patient's brother has been assisting patient with renovations to patient's home. Patient reported patient has been going through items in patient's home, including mother's belongings. Patient stated, still having these crying spells out of the blue when thinking about something sentimental or sad. Patient stated, random thoughts, something momma liked to do or something related to her, if I hear about someone going through a hard time in response to triggers for crying/sadness. Patient stated, I'm really tired of being like a faucet on and off as it relates to crying. Patient reported an increase in energy and motivation in response to new medication. Patient reported an increase in crying/sadness. Patient stated, I feel like I'm alright until  somebody reminds me in reference to grief. Patient stated, I felt very lost in reference to the loss of patient's mother. Patient stated, I have some goals and things I want to do, I am an individual alone, just trying to find my footing where I go now.  Patient stated, I'm trying to make things comfortable to me, I'm still trying to figure out what that is in reference to external adjustments in patient's home.    Interventions: Cognitive Behavioral Therapy. Clinician conducted session via caregility video from clinician's home office. Patient provided verbal consent to proceed with telehealth session and is aware of limitations of telephone or video visits. Patient participated in session from patient's home. Reviewed events since last session and assessed for changes. Discussed patient's response to going through mother's belongings. Explored and identified triggers for crying/sadness and thoughts associated with feelings of sadness. Provided psycho education related to psychotropic medication and provided psycho education related to grief. Explored internal and external adjustments to loss of mother.    Collaboration of Care: Other not required at this time   Diagnosis:  Major depressive disorder, recurrent episode, moderate with anxious distress     Plan: Patient is to utilize [employer] Therapy, thought re-framing, behavioral activation, and coping strategies to decrease symptoms associated with their diagnosis. Frequency: bi-weekly  Modality: individual      Long-term goal:   Reduce overall level, frequency, and intensity of the feelings of depression as evidenced by decreased crying, difficulty falling asleep and staying asleep, changes in appetite, lack of energy, fatigue, lack of motivation, difficulty focusing, and loss of interest from 7 days/week to 2 to 3 days/week per patient report for at least 3 consecutive  months. Target Date: 09-12-2024  Progress: progressing     Short-term goal:  Begin a healthy grieving process around the loss of patient's mother Target Date: 2024/09/12  Progress: progressing    Decrease negative thoughts associated with patient's mother's death Target Date: Sep 12, 2024  Progress: progressing    Identify, challenge, and replace negative thought patterns that contribute to feelings of depression with positive thoughts and beliefs per patient's report Target Date: 12-Sep-2024  Progress: progressing    Increase activities that patient enjoys to support increased motivation and mood, such as, socializing with friends weekly, completing puzzles, playing games. Target Date: 12-Sep-2024  Progress: progressing       [name], LCSW

## 2023-11-13 ENCOUNTER — Encounter: Payer: Self-pay | Admitting: Nurse Practitioner

## 2023-11-13 DIAGNOSIS — F332 Major depressive disorder, recurrent severe without psychotic features: Secondary | ICD-10-CM

## 2023-11-14 MED ORDER — DULOXETINE HCL 30 MG PO CPEP: ORAL_CAPSULE | ORAL | 1 refills | Status: DC

## 2023-11-14 NOTE — Addendum Note (Signed)
 Addended by: Natania Finigan T on: 11/14/2023 08:16 AM   Modules accepted: Orders

## 2023-11-21 DIAGNOSIS — Z23 Encounter for immunization: Secondary | ICD-10-CM | POA: Diagnosis not present

## 2023-12-05 ENCOUNTER — Other Ambulatory Visit: Payer: Self-pay | Admitting: Nurse Practitioner

## 2023-12-05 DIAGNOSIS — F4320 Adjustment disorder, unspecified: Secondary | ICD-10-CM

## 2023-12-05 DIAGNOSIS — F332 Major depressive disorder, recurrent severe without psychotic features: Secondary | ICD-10-CM

## 2023-12-06 ENCOUNTER — Ambulatory Visit: Admitting: Clinical

## 2023-12-06 DIAGNOSIS — F331 Major depressive disorder, recurrent, moderate: Secondary | ICD-10-CM | POA: Diagnosis not present

## 2023-12-06 NOTE — Progress Notes (Signed)
 [facility] Counselor/Therapist Progress Note  Patient ID: Marie Perry, MRN: [id number],    Date: 12/06/2023  Time Spent: 9:35am - 10:35am : 60 minutes   Treatment Type: Individual Therapy  Reported Symptoms: worry, feeling blah  Mental Status Exam: Appearance:  Neat and Well Groomed     Behavior: Appropriate  Motor: Normal  Speech/Language:  Clear and Coherent and Normal Rate  Affect: Appropriate  Mood: Patient reported feeling blah  Thought process: normal  Thought content:   WNL  Sensory/Perceptual disturbances:   WNL  Orientation: oriented to person, place, time/date, and situation  Attention: Good  Concentration: Good  Memory: WNL  Fund of knowledge:  Good  Insight:   Good  Judgment:  Good  Impulse Control: Good   Risk Assessment: Danger to Self:  No Patient denied current suicidal ideation  Self-injurious Behavior: No Danger to Others: No Patient denied current homicidal ideation Duty to Warn:no Physical Aggression / Violence:No  Access to Firearms a concern: No  Gang Involvement:No   Subjective: Patient reported patient did not have running water in patient's kitchen for 2 weeks during recent renovations. Patient reported recently the water line to patient's washing machine burst and caused damages to patient's home. Patient reported patient has been residing in a hotel for 3 weeks due to water damage and repairs. Patient stated, just not knowing what we're suppose to be doing next is really stressing me out. Patient stated, I'm like, what do I do today. Patient stated, stressed but I'm still trying to manage those things and tell myself it is what it is and don't be worrying about it every day. Patient reported Manufacturing engineer took pictures of patient's home and patient stated, I felt invaded. Patient reported patient felt insurance personnel should have asked patient's permission to take pictures in patient's home. Patient  stated, when I walk in there (home) and see all this stuff everywhere it makes me feel tired. Patient reported worry related to the status of water heater. Patient reported Abilify  has been discontinued and PCP restarted duloxetine . Patient reported patient's PCP referred patient to a psychiatrist for a consultation and then PCP plans to resume medication management. Patient stated,  I feel better since I switched the medication, I'm not wanting to cry. Patient reported feeling blahtoday. Patient inquired about psychiatry consult.    Interventions: Cognitive Behavioral Therapy and supportive therapy. Clinician conducted session via caregility video from clinician's home office. Patient provided verbal consent to proceed with telehealth session and is aware of limitations of telephone or video visits. Patient participated in session from patient's hotel room. Reviewed events since last session and assessed for changes. Provided supportive therapy, active listening as patient discussed recent stressors and displacement from patient's home. Explored patient's thoughts and feelings in response to stressors. Discussed patient's practice of challenging negative thoughts in response to thoughts associated with stressors. Discussed recent changes in medication and patient's response to change.  Provided psycho education related to consultation with psychiatrist.   Collaboration of Care: Other not required at this time   Diagnosis:  Major depressive disorder, recurrent episode, moderate with anxious distress     Plan: Patient is to utilize [employer] Therapy, thought re-framing, behavioral activation, and coping strategies to decrease symptoms associated with their diagnosis. Frequency: bi-weekly  Modality: individual      Long-term goal:   Reduce overall level, frequency, and intensity of the feelings of depression as evidenced by decreased crying, difficulty falling asleep and staying asleep,  changes  in appetite, lack of energy, fatigue, lack of motivation, difficulty focusing, and loss of interest from 7 days/week to 2 to 3 days/week per patient report for at least 3 consecutive months. Target Date: 2024/08/24  Progress: progressing    Short-term goal:  Begin a healthy grieving process around the loss of patient's mother Target Date: 08/24/24  Progress: progressing    Decrease negative thoughts associated with patient's mother's death Target Date: 2024-08-24  Progress: progressing    Identify, challenge, and replace negative thought patterns that contribute to feelings of depression with positive thoughts and beliefs per patient's report Target Date: 08/24/2024  Progress: progressing    Increase activities that patient enjoys to support increased motivation and mood, such as, socializing with friends weekly, completing puzzles, playing games. Target Date: 2024-08-24  Progress: progressing       [name], LCSW

## 2023-12-06 NOTE — Telephone Encounter (Signed)
 D/C 08/02/22. Requested Prescriptions  Signed Prescriptions Disp Refills   buPROPion  (WELLBUTRIN  XL) 300 MG 24 hr tablet 90 tablet 0    Sig: TAKE 1 TABLET BY MOUTH EVERY DAY     Psychiatry: Antidepressants - bupropion  Passed - 12/06/2023  2:05 PM      Passed - Cr in normal range and within 360 days    Creatinine, Ser  Date Value Ref Range Status  07/19/2023 0.88 0.57 - 1.00 mg/dL Final         Passed - AST in normal range and within 360 days    AST  Date Value Ref Range Status  07/19/2023 13 0 - 40 IU/L Final         Passed - ALT in normal range and within 360 days    ALT  Date Value Ref Range Status  07/19/2023 14 0 - 32 IU/L Final         Passed - Completed PHQ-2 or PHQ-9 in the last 360 days      Passed - Last BP in normal range    BP Readings from Last 1 Encounters:  10/10/23 120/72         Passed - Valid encounter within last 6 months    Recent Outpatient Visits           1 month ago Severe episode of recurrent major depressive disorder, without psychotic features (HCC)   [place] [facility] [place], Jolene T, NP   3 months ago Severe episode of recurrent major depressive disorder, without psychotic features (HCC)   [place] [facility] [place], Jolene T, NP   4 months ago Severe episode of recurrent major depressive disorder, without psychotic features (HCC)   [place] [facility] [place], [place] T, NP              Refused Prescriptions Disp Refills   buPROPion  (WELLBUTRIN  XL) 150 MG 24 hr tablet [Pharmacy Med Name: BUPROPION  HCL XL 150 MG TABLET] 90 tablet 2    Sig: TAKE 1 TABLET BY MOUTH EVERY DAY     Psychiatry: Antidepressants - bupropion  Passed - 12/06/2023  2:05 PM      Passed - Cr in normal range and within 360 days    Creatinine, Ser  Date Value Ref Range Status  07/19/2023 0.88 0.57 - 1.00 mg/dL Final         Passed - AST in normal range and within 360 days    AST  Date Value Ref Range  Status  07/19/2023 13 0 - 40 IU/L Final         Passed - ALT in normal range and within 360 days    ALT  Date Value Ref Range Status  07/19/2023 14 0 - 32 IU/L Final         Passed - Completed PHQ-2 or PHQ-9 in the last 360 days      Passed - Last BP in normal range    BP Readings from Last 1 Encounters:  10/10/23 120/72         Passed - Valid encounter within last 6 months    Recent Outpatient Visits           1 month ago Severe episode of recurrent major depressive disorder, without psychotic features (HCC)   [place] [facility] [place], Jolene T, NP   3 months ago Severe episode of recurrent major depressive disorder, without psychotic features (HCC)   [place] [facility] [place], Jolene T, NP   4 months ago Severe episode of recurrent major depressive disorder, without psychotic features (HCC)   [facility] [place], Melanie DASEN, NP

## 2023-12-06 NOTE — Progress Notes (Signed)
   [name], LCSW

## 2023-12-06 NOTE — Telephone Encounter (Signed)
 Requested Prescriptions  Pending Prescriptions Disp Refills   buPROPion  (WELLBUTRIN  XL) 150 MG 24 hr tablet [Pharmacy Med Name: BUPROPION  HCL XL 150 MG TABLET] 90 tablet 2    Sig: TAKE 1 TABLET BY MOUTH EVERY DAY     Psychiatry: Antidepressants - bupropion  Passed - 12/06/2023  2:04 PM      Passed - Cr in normal range and within 360 days    Creatinine, Ser  Date Value Ref Range Status  07/19/2023 0.88 0.57 - 1.00 mg/dL Final         Passed - AST in normal range and within 360 days    AST  Date Value Ref Range Status  07/19/2023 13 0 - 40 IU/L Final         Passed - ALT in normal range and within 360 days    ALT  Date Value Ref Range Status  07/19/2023 14 0 - 32 IU/L Final         Passed - Completed PHQ-2 or PHQ-9 in the last 360 days      Passed - Last BP in normal range    BP Readings from Last 1 Encounters:  10/10/23 120/72         Passed - Valid encounter within last 6 months    Recent Outpatient Visits           1 month ago Severe episode of recurrent major depressive disorder, without psychotic features (HCC)   [place] [facility] [place], Jolene T, NP   3 months ago Severe episode of recurrent major depressive disorder, without psychotic features (HCC)   [place] [facility] [place], Jolene T, NP   4 months ago Severe episode of recurrent major depressive disorder, without psychotic features (HCC)   [place] [facility] [place], Jolene T, NP               buPROPion  (WELLBUTRIN  XL) 300 MG 24 hr tablet [Pharmacy Med Name: BUPROPION  HCL XL 300 MG TABLET] 90 tablet 0    Sig: TAKE 1 TABLET BY MOUTH EVERY DAY     Psychiatry: Antidepressants - bupropion  Passed - 12/06/2023  2:04 PM      Passed - Cr in normal range and within 360 days    Creatinine, Ser  Date Value Ref Range Status  07/19/2023 0.88 0.57 - 1.00 mg/dL Final         Passed - AST in normal range and within 360 days    AST  Date Value Ref Range  Status  07/19/2023 13 0 - 40 IU/L Final         Passed - ALT in normal range and within 360 days    ALT  Date Value Ref Range Status  07/19/2023 14 0 - 32 IU/L Final         Passed - Completed PHQ-2 or PHQ-9 in the last 360 days      Passed - Last BP in normal range    BP Readings from Last 1 Encounters:  10/10/23 120/72         Passed - Valid encounter within last 6 months    Recent Outpatient Visits           1 month ago Severe episode of recurrent major depressive disorder, without psychotic features (HCC)   [place] [facility] [place], Jolene T, NP   3 months ago Severe episode of recurrent major depressive disorder, without psychotic features (HCC)   [place]  [facility] [place], [place] T, NP   4 months ago Severe episode of recurrent major depressive disorder, without psychotic features [facility])   [place] [facility] [place], Melanie DASEN, NP

## 2023-12-11 ENCOUNTER — Other Ambulatory Visit: Payer: Self-pay | Admitting: Nurse Practitioner

## 2023-12-13 NOTE — Telephone Encounter (Signed)
 Requested Prescriptions  Pending Prescriptions Disp Refills   ibandronate  (BONIVA ) 150 MG tablet [Pharmacy Med Name: IBANDRONATE  SODIUM 150 MG TAB] 3 tablet 0    Sig: TAKE 1 TABLET (150 MG TOTAL) BY MOUTH EVERY 30 (THIRTY) DAYS. TAKE IN THE MORNING WITH A FULL GLASS OF WATER, ON AN EMPTY STOMACH, AND DO NOT TAKE ANYTHING ELSE BY MOUTH OR LIE DOWN FOR THE NEXT 30 MIN.     Endocrinology:  Bisphosphonates Failed - 12/13/2023  8:54 AM      Failed - Mg Level in normal range and within 360 days    Magnesium  Date Value Ref Range Status  06/12/2021 2.2 1.6 - 2.3 mg/dL Final         Failed - Phosphate in normal range and within 360 days    No results found for: PHOS       Passed - Ca in normal range and within 360 days    Calcium   Date Value Ref Range Status  07/19/2023 9.2 8.7 - 10.3 mg/dL Final         Passed - Vitamin D  in normal range and within 360 days    Vit D, 25-Hydroxy  Date Value Ref Range Status  07/19/2023 42.9 30.0 - 100.0 ng/mL Final    Comment:    Vitamin D  deficiency has been defined by the Institute of Medicine and an Endocrine Society practice guideline as a level of serum 25-OH vitamin D  less than 20 ng/mL (1,2). The Endocrine Society went on to further define vitamin D  insufficiency as a level between 21 and 29 ng/mL (2). 1. IOM (Institute of Medicine). 2010. Dietary reference    intakes for calcium  and D. [place]: The    [employer]. 2. Holick MF, Binkley , Bischoff-Ferrari HA, et al.    Evaluation, treatment, and prevention of vitamin D     deficiency: an Endocrine Society clinical practice    guideline. JCEM. 2011 Jul; 96(7):1911-30.          Passed - Cr in normal range and within 360 days    Creatinine, Ser  Date Value Ref Range Status  07/19/2023 0.88 0.57 - 1.00 mg/dL Final         Passed - eGFR is 30 or above and within 360 days    GFR calc Af Amer  Date Value Ref Range Status  05/08/2019 70 >59 mL/min/1.73 Final   GFR  calc non Af Amer  Date Value Ref Range Status  05/08/2019 61 >59 mL/min/1.73 Final   eGFR  Date Value Ref Range Status  07/19/2023 70 >59 mL/min/1.73 Final         Passed - Valid encounter within last 12 months    Recent Outpatient Visits           2 months ago Severe episode of recurrent major depressive disorder, without psychotic features (HCC)   [place] [facility] [place], Jolene T, NP   3 months ago Severe episode of recurrent major depressive disorder, without psychotic features (HCC)   [place] [facility] [place], Jolene T, NP   4 months ago Severe episode of recurrent major depressive disorder, without psychotic features (HCC)   [place] [facility] [place], [place] T, NP              Passed - Bone Mineral Density or Dexa Scan completed in the last 2 years

## 2023-12-26 DIAGNOSIS — F411 Generalized anxiety disorder: Secondary | ICD-10-CM | POA: Diagnosis not present

## 2023-12-26 DIAGNOSIS — F5105 Insomnia due to other mental disorder: Secondary | ICD-10-CM | POA: Diagnosis not present

## 2023-12-26 DIAGNOSIS — F331 Major depressive disorder, recurrent, moderate: Secondary | ICD-10-CM | POA: Diagnosis not present

## 2023-12-27 ENCOUNTER — Ambulatory Visit: Admitting: Clinical

## 2023-12-27 DIAGNOSIS — F331 Major depressive disorder, recurrent, moderate: Secondary | ICD-10-CM

## 2023-12-27 NOTE — Progress Notes (Signed)
   [name], LCSW

## 2023-12-27 NOTE — Progress Notes (Signed)
 [facility] Counselor/Therapist Progress Note  Patient ID: Marie Perry, MRN: [id number],    Date: 12/27/2023  Time Spent: 10:37am - 11:26am : 49 minutes   Treatment Type: Individual Therapy  Reported Symptoms: Patient reported feeling overwhelmed, decreased motivation  Mental Status Exam: Appearance:  Neat and Well Groomed     Behavior: Appropriate  Motor: Normal  Speech/Language:  Clear and Coherent and Normal Rate  Affect: Appropriate  Mood: normal  Thought process: normal  Thought content:   WNL  Sensory/Perceptual disturbances:   WNL  Orientation: oriented to person, place, time/date, and situation  Attention: Good  Concentration: Good  Memory: WNL  Fund of knowledge:  Good  Insight:   Good  Judgment:  Good  Impulse Control: Good   Risk Assessment: Danger to Self:  No Patient denied current suicidal ideation  Self-injurious Behavior: No Danger to Others: No Patient denied current homicidal ideation Duty to Warn:no Physical Aggression / Violence:No  Access to Firearms a concern: No  Gang Involvement:No   Subjective: Patient reported patient's home repairs have been completed and patient has returned to patient's home. Patient reported patient noted a leak above patient's water heater and was advised patient is in need of a new roof. Patient stated, I'm slightly stressed over that in reference to roof. Patient reported patient attended an appointment with Dr. Chipper yesterday and reported feeling anxious prior to the appointment. Patient reported patient has a follow up appointment in December. Patient reported feeling overwhelmed in response to additional stressors. Patient stated, I don't feel like doing anything, I have no motivation. Patient reported patient's schedule conflicts with patient's friends' schedules and reported scheduling conflict is a barrier to socialization. Patient stated, I don't take any pleasure in going shopping or  anything by myself, it's not fun. Patient stated, my feeling at the time, if I feel down I don't want to make others feel down, finances, and cold/flu season are barriers to socialization. Patient reported patient orders groceries online and shops for other items online.  Patient stated, fair in response to current mood.   Interventions: Cognitive Behavioral Therapy and DBT skills. Clinician conducted session via caregility video from clinician's home office. Patient provided verbal consent to proceed with telehealth session and is aware of limitations of telephone or video visits. Patient participated in session from patient's home. Reviewed events since last session and assessed for changes. Discussed additional stressors and explored the impact on patient's mood. Reviewed reframing negative thoughts/perspective when a situation is out of patient's control. Provided psycho education related to depression and benefits of socialization. Discussed patient's current social activities. Explored and identified barriers to socialization. Discussed a goal of going to the grocery store in person once per month. Provided psycho education related to use of opposite action and cycle of avoidance. Reviewed behavioral activation (dividing large tasks into smaller tasks). Clinician requested for homework patient practice opposite action and behavioral activation strategies.    Collaboration of Care: Other not required at this time   Diagnosis:  Major depressive disorder, recurrent episode, moderate with anxious distress     Plan: Patient is to utilize [employer] Therapy, thought re-framing, behavioral activation, and coping strategies to decrease symptoms associated with their diagnosis. Frequency: bi-weekly  Modality: individual      Long-term goal:   Reduce overall level, frequency, and intensity of the feelings of depression as evidenced by decreased crying, difficulty falling asleep and  staying asleep, changes in appetite, lack of energy, fatigue, lack of motivation,  difficulty focusing, and loss of interest from 7 days/week to 2 to 3 days/week per patient report for at least 3 consecutive months. Target Date: 09/05/2024  Progress: progressing    Short-term goal:  Begin a healthy grieving process around the loss of patient's mother Target Date: 09-05-24  Progress: progressing    Decrease negative thoughts associated with patient's mother's death Target Date: 09-05-2024  Progress: progressing    Identify, challenge, and replace negative thought patterns that contribute to feelings of depression with positive thoughts and beliefs per patient's report Target Date: 09/05/24  Progress: progressing    Increase activities that patient enjoys to support increased motivation and mood, such as, socializing with friends weekly, completing puzzles, playing games. Target Date: 2024-09-05  Progress: progressing         [name], LCSW

## 2024-01-10 NOTE — Patient Instructions (Signed)
 Be Involved in Caring For Your Health:  Taking Medications When medications are taken as directed, they can greatly improve your health. But if they are not taken as prescribed, they may not work. In some cases, not taking them correctly can be harmful. To help ensure your treatment remains effective and safe, understand your medications and how to take them. Bring your medications to each visit for review by your provider.  Your lab results, notes, and after visit summary will be available on My Chart. We strongly encourage you to use this feature. If lab results are abnormal the clinic will contact you with the appropriate steps. If the clinic does not contact you assume the results are satisfactory. You can always view your results on My Chart. If you have questions regarding your health or results, please contact the clinic during office hours. You can also ask questions on My Chart.  We at [facility] are grateful that you chose us  to provide your care. We strive to provide evidence-based and compassionate care and are always looking for feedback. If you get a survey from the clinic please complete this so we can hear your opinions.  Managing Depression, Adult Depression is a mental health condition that affects your thoughts, feelings, and actions. Being diagnosed with depression can bring you relief if you did not know why you have felt or behaved a certain way. It could also leave you feeling overwhelmed. Finding ways to manage your symptoms can help you feel more positive about your future. How to manage lifestyle changes Being depressed is difficult. Depression can increase the level of everyday stress. Stress can make depression symptoms worse. You may believe your symptoms cannot be managed or will never improve. However, there are many things you can try to help manage your symptoms. There is hope. Managing stress  Stress is your body's reaction to life changes and events,  both good and bad. Stress can add to your feelings of depression. Learning to manage your stress can help lessen your feelings of depression. Try some of the following approaches to reducing your stress (stress reduction techniques): Listen to music that you enjoy and that inspires you. Try using a meditation app or take a meditation class. Develop a practice that helps you connect with your spiritual self. Walk in nature, pray, or go to a place of worship. Practice deep breathing. To do this, inhale slowly through your nose. Pause at the top of your inhale for a few seconds and then exhale slowly, letting yourself relax. Repeat this three or four times. Practice yoga to help relax and work your muscles. Choose a stress reduction technique that works for you. These techniques take time and practice to develop. Set aside 5-15 minutes a day to do them. Therapists can offer training in these techniques. Do these things to help manage stress: Keep a journal. Know your limits. Set healthy boundaries for yourself and others, such as saying no when you think something is too much. Pay attention to how you react to certain situations. You may not be able to control everything, but you can change your reaction. Add humor to your life by watching funny movies or shows. Make time for activities that you enjoy and that relax you. Spend less time using electronics, especially at night before bed. The light from screens can make your brain think it is time to get up rather than go to bed.  Medicines Medicines, such as antidepressants, are often a part of  treatment for depression. Talk with your pharmacist or health care provider about all the medicines, supplements, and herbal products that you take, their possible side effects, and what medicines and other products are safe to take together. Make sure to report any side effects you may have to your health care provider. Relationships Your health care  provider may suggest family therapy, couples therapy, or individual therapy as part of your treatment. How to recognize changes Everyone responds differently to treatment for depression. As you recover from depression, you may start to: Have more interest in doing activities. Feel more hopeful. Have more energy. Eat a more regular amount of food. Have better mental focus. It is important to recognize if your depression is not getting better or is getting worse. The symptoms you had in the beginning may return, such as: Feeling tired. Eating too much or too little. Sleeping too much or too little. Feeling restless, agitated, or hopeless. Trouble focusing or making decisions. Having unexplained aches and pains. Feeling irritable, angry, or aggressive. If you or your family members notice these symptoms coming back, let your health care provider know right away. Follow these instructions at home: Activity Try to get some form of exercise each day, such as walking. Try yoga, mindfulness, or other stress reduction techniques. Participate in group activities if you are able. Lifestyle Get enough sleep. Cut down on or stop using caffeine, tobacco, alcohol, and any other harmful substances. Eat a healthy diet that includes plenty of vegetables, fruits, whole grains, low-fat dairy products, and lean protein. Limit foods that are high in solid fats, added sugar, or salt (sodium). General instructions Take over-the-counter and prescription medicines only as told by your health care provider. Keep all follow-up visits. It is important for your health care provider to check on your mood, behavior, and medicines. Your health care provider may need to make changes to your treatment. Where to find support Talking to others  Friends and family members can be sources of support and guidance. Talk to trusted friends or family members about your condition. Explain your symptoms and let them know that you  are working with a health care provider to treat your depression. Tell friends and family how they can help. Finances Find mental health providers that fit with your financial situation. Talk with your health care provider if you are worried about access to food, housing, or medicine. Call your insurance company to learn about your co-pays and prescription plan. Where to find more information You can find support in your area from: Anxiety and Depression Association of America (ADAA): adaa.org Mental Health America: mentalhealthamerica.net The [employer] on Mental Illness: nami.org Contact a health care provider if: You stop taking your antidepressant medicines, and you have any of these symptoms: Nausea. Headache. Light-headedness. Chills and body aches. Not being able to sleep (insomnia). You or your friends and family think your depression is getting worse. Get help right away if: You have thoughts of hurting yourself or others. Get help right away if you feel like you may hurt yourself or others, or have thoughts about taking your own life. Go to your nearest emergency room or: Call 911. Call the National Suicide Prevention Lifeline at (743) 630-7432 or 988. This is open 24 hours a day. Text the Crisis Text Line at 854-590-3828. This information is not intended to replace advice given to you by your health care provider. Make sure you discuss any questions you have with your health care provider. Document Revised: 06/06/2021 Document Reviewed:  06/06/2021 Elsevier Patient Education  2024 [employer].

## 2024-01-15 ENCOUNTER — Encounter: Payer: Self-pay | Admitting: Nurse Practitioner

## 2024-01-15 ENCOUNTER — Ambulatory Visit: Admitting: Nurse Practitioner

## 2024-01-15 VITALS — BP 126/74 | HR 80 | Temp 97.9°F | Resp 15 | Ht 65.98 in | Wt 189.6 lb

## 2024-01-15 DIAGNOSIS — E782 Mixed hyperlipidemia: Secondary | ICD-10-CM | POA: Diagnosis not present

## 2024-01-15 DIAGNOSIS — E66811 Obesity, class 1: Secondary | ICD-10-CM

## 2024-01-15 DIAGNOSIS — M810 Age-related osteoporosis without current pathological fracture: Secondary | ICD-10-CM | POA: Diagnosis not present

## 2024-01-15 DIAGNOSIS — F332 Major depressive disorder, recurrent severe without psychotic features: Secondary | ICD-10-CM | POA: Diagnosis not present

## 2024-01-15 DIAGNOSIS — Z6831 Body mass index (BMI) 31.0-31.9, adult: Secondary | ICD-10-CM | POA: Diagnosis not present

## 2024-01-15 DIAGNOSIS — G4733 Obstructive sleep apnea (adult) (pediatric): Secondary | ICD-10-CM

## 2024-01-15 DIAGNOSIS — E6609 Other obesity due to excess calories: Secondary | ICD-10-CM | POA: Diagnosis not present

## 2024-01-15 NOTE — Progress Notes (Signed)
 BP 126/74 (BP Location: Left Arm, Patient Position: Sitting, Cuff Size: Normal)   Pulse 80   Temp 97.9 F (36.6 C) (Oral)   Resp 15   Ht 5' 5.98 (1.676 m)   Wt 189 lb 9.6 oz (86 kg)   LMP  (LMP Unknown)   SpO2 98%   BMI 30.62 kg/m    Subjective:    Patient ID: Marie Perry, female    DOB: November 11, 1950, 73 y.o.   MRN: [id number]  HPI: Marie Perry is a 73 y.o. female  Chief Complaint  Patient presents with   Depression    Doing alright and taking it day by day with the new medication switch. Came off Wellbutrin  and increased Cymbalta  and added Sertraline.    HLD    Osteoporosis   HYPERLIPIDEMIA Taking Rosuvastatin  weekly, did not tolerate daily dosing. Hyperlipidemia status: good compliance Satisfied with current treatment?  yes Side effects:  no Medication compliance: good compliance Supplements: none Aspirin:  no The 10-year ASCVD risk score (Arnett DK, et al., 2019) is: 10.8%   Values used to calculate the score:     Age: 71 years     Clincally relevant sex: Female     Is Non-Hispanic African American: No     Diabetic: No     Tobacco smoker: No     Systolic Blood Pressure: 126 mmHg     Is BP treated: No     HDL Cholesterol: 57 mg/dL     Total Cholesterol: 160 mg/dL Chest pain:  no Coronary artery disease:  no Family history CAD:  yes Family history early CAD:  no   OSTEOPOROSIS Taking Boniva  and tolerating this. Last DEXA 02/18/23 with T-score -2.9. Satisfied with current treatment?: yes Medication side effects: no Medication compliance: good compliance Past osteoporosis medications/treatments: Fosamax Adequate calcium  & vitamin D : yes Intolerance to bisphosphonates:no Weight bearing exercises: yes   DEPRESSION Following with psychiatry at this time, Dr. Chipper. Last visit on 12/26/23. Therapy seen last on 12/27/23.  Wellbutrin  was stopped and she is taking Duloxetine  and Sertraline. She feels like gradually this change is helping.   Mood  status: stable Satisfied with current treatment?: yes Symptom severity: moderate  Duration of current treatment : chronic Side effects: as above with Trazodone  Medication compliance: good compliance Psychotherapy/counseling: yes current Depressed mood: occasional Anxious mood: occasional Anhedonia: occasional Significant weight loss or gain: no Insomnia: no Fatigue: no Feelings of worthlessness or guilt: occasional Impaired concentration/indecisiveness: no Suicidal ideations: no Hopelessness:occasional Crying spells: yes    01/15/2024    9:32 AM 10/10/2023   10:07 AM 08/22/2023    9:49 AM 07/19/2023    9:47 AM 04/16/2023    9:42 AM  Depression screen PHQ 2/9  Decreased Interest 1 1 1 1 3   Down, Depressed, Hopeless 1 1 1 2 1   PHQ - 2 Score 2 2 2 3 4   Altered sleeping 1 2 1 1 1   Tired, decreased energy 1 1 1 3 2   Change in appetite 0 0 1 1 1   Feeling bad or failure about yourself  0 0 1 1 0  Trouble concentrating 1 1 0 1 1  Moving slowly or fidgety/restless 0 0 0 0 0  Suicidal thoughts 0 0 0 0 0  PHQ-9 Score 5 6  6  10  9    Difficult doing work/chores  Somewhat difficult Somewhat difficult Somewhat difficult Somewhat difficult     Data saved with a previous flowsheet row definition  01/15/2024    9:32 AM 10/10/2023   10:07 AM 08/22/2023    9:49 AM 07/19/2023    9:44 AM  GAD 7 : Generalized Anxiety Score  Nervous, Anxious, on Edge 1 1 1 1   Control/stop worrying 0 0 0 1  Worry too much - different things 0 0 0 1  Trouble relaxing 0 0 1 0  Restless 0 0 0 0  Easily annoyed or irritable 0 0 0 1  Afraid - awful might happen 0 0 0 0  Total GAD 7 Score 1 1 2 4   Anxiety Difficulty  Somewhat difficult Not difficult at all Not difficult at all   Relevant past medical, surgical, family and social history reviewed and updated as indicated. Interim medical history since our last visit reviewed. Allergies and medications reviewed and updated.  Review of Systems  Constitutional:   Negative for activity change, appetite change, diaphoresis, fatigue and fever.  Respiratory:  Negative for cough, chest tightness and shortness of breath.   Cardiovascular:  Negative for chest pain, palpitations and leg swelling.  Gastrointestinal: Negative.   Neurological: Negative.   Psychiatric/Behavioral:  Positive for decreased concentration and sleep disturbance. Negative for self-injury and suicidal ideas. The patient is nervous/anxious.     Per HPI unless specifically indicated above     Objective:    BP 126/74 (BP Location: Left Arm, Patient Position: Sitting, Cuff Size: Normal)   Pulse 80   Temp 97.9 F (36.6 C) (Oral)   Resp 15   Ht 5' 5.98 (1.676 m)   Wt 189 lb 9.6 oz (86 kg)   LMP  (LMP Unknown)   SpO2 98%   BMI 30.62 kg/m   Wt Readings from Last 3 Encounters:  01/15/24 189 lb 9.6 oz (86 kg)  10/10/23 191 lb 3.2 oz (86.7 kg)  08/22/23 196 lb (88.9 kg)    Physical Exam Vitals and nursing note reviewed.  Constitutional:      General: She is awake. She is not in acute distress.    Appearance: She is well-developed and well-groomed. She is obese. She is not ill-appearing or toxic-appearing.  HENT:     Head: Normocephalic.     Right Ear: Hearing and external ear normal.     Left Ear: Hearing and external ear normal.  Eyes:     General: Lids are normal.        Right eye: No discharge.        Left eye: No discharge.     Conjunctiva/sclera: Conjunctivae normal.     Pupils: Pupils are equal, round, and reactive to light.  Neck:     Thyroid : No thyromegaly.     Vascular: No carotid bruit.  Cardiovascular:     Rate and Rhythm: Normal rate and regular rhythm.     Heart sounds: Normal heart sounds. No murmur heard.    No gallop.  Pulmonary:     Effort: Pulmonary effort is normal. No accessory muscle usage or respiratory distress.     Breath sounds: Normal breath sounds.  Abdominal:     General: Bowel sounds are normal. There is no distension.     Palpations:  Abdomen is soft.     Tenderness: There is no abdominal tenderness.  Musculoskeletal:     Cervical back: Normal range of motion and neck supple.     Right lower leg: No edema.     Left lower leg: No edema.  Lymphadenopathy:     Cervical: No cervical adenopathy.  Skin:  General: Skin is warm and dry.  Neurological:     Mental Status: She is alert and oriented to person, place, and time.     Deep Tendon Reflexes: Reflexes are normal and symmetric.     Reflex Scores:      Brachioradialis reflexes are 2+ on the right side and 2+ on the left side.      Patellar reflexes are 2+ on the right side and 2+ on the left side. Psychiatric:        Attention and Perception: Attention normal.        Mood and Affect: Mood normal.        Speech: Speech normal.        Behavior: Behavior normal. Behavior is cooperative.        Thought Content: Thought content normal.    Results for orders placed or performed in visit on 07/19/23  CBC with Differential/Platelet   Collection Time: 07/19/23 10:39 AM  Result Value Ref Range   WBC 5.2 3.4 - 10.8 x10E3/uL   RBC 4.45 3.77 - 5.28 x10E6/uL   Hemoglobin 13.5 11.1 - 15.9 g/dL   Hematocrit 58.1 65.9 - 46.6 %   MCV 94 79 - 97 fL   MCH 30.3 26.6 - 33.0 pg   MCHC 32.3 31.5 - 35.7 g/dL   RDW 87.0 88.2 - 84.5 %   Platelets 187 150 - 450 x10E3/uL   Neutrophils 74 Not Estab. %   Lymphs 14 Not Estab. %   Monocytes 9 Not Estab. %   Eos 2 Not Estab. %   Basos 1 Not Estab. %   Neutrophils Absolute 3.9 1.4 - 7.0 x10E3/uL   Lymphocytes Absolute 0.7 0.7 - 3.1 x10E3/uL   Monocytes Absolute 0.5 0.1 - 0.9 x10E3/uL   EOS (ABSOLUTE) 0.1 0.0 - 0.4 x10E3/uL   Basophils Absolute 0.1 0.0 - 0.2 x10E3/uL   Immature Granulocytes 0 Not Estab. %   Immature Grans (Abs) 0.0 0.0 - 0.1 x10E3/uL  Comprehensive metabolic panel with GFR   Collection Time: 07/19/23 10:39 AM  Result Value Ref Range   Glucose 96 70 - 99 mg/dL   BUN 16 8 - 27 mg/dL   Creatinine, Ser 9.11 0.57 -  1.00 mg/dL   eGFR 70 >40 fO/fpw/8.26   BUN/Creatinine Ratio 18 12 - 28   Sodium 139 134 - 144 mmol/L   Potassium 4.0 3.5 - 5.2 mmol/L   Chloride 103 96 - 106 mmol/L   CO2 21 20 - 29 mmol/L   Calcium  9.2 8.7 - 10.3 mg/dL   Total Protein 6.1 6.0 - 8.5 g/dL   Albumin 4.2 3.8 - 4.8 g/dL   Globulin, Total 1.9 1.5 - 4.5 g/dL   Bilirubin Total 0.4 0.0 - 1.2 mg/dL   Alkaline Phosphatase 78 44 - 121 IU/L   AST 13 0 - 40 IU/L   ALT 14 0 - 32 IU/L  Lipid Panel w/o Chol/HDL Ratio   Collection Time: 07/19/23 10:39 AM  Result Value Ref Range   Cholesterol, Total 160 100 - 199 mg/dL   Triglycerides 882 0 - 149 mg/dL   HDL 57 >60 mg/dL   VLDL Cholesterol Cal 21 5 - 40 mg/dL   LDL Chol Calc (NIH) 82 0 - 99 mg/dL  TSH   Collection Time: 07/19/23 10:39 AM  Result Value Ref Range   TSH 1.870 0.450 - 4.500 uIU/mL  Vitamin B12   Collection Time: 07/19/23 10:39 AM  Result Value Ref Range   Vitamin B-12  664 232 - 1,245 pg/mL  VITAMIN D  25 Hydroxy (Vit-D Deficiency, Fractures)   Collection Time: 07/19/23 10:39 AM  Result Value Ref Range   Vit D, 25-Hydroxy 42.9 30.0 - 100.0 ng/mL      Assessment & Plan:   Problem List Items Addressed This Visit       Musculoskeletal and Integument   Osteoporosis   Chronic, ongoing with poor tolerance to Fosamax in past.  She would like to try Prolia , but too costly coverage wise.  Continue Boniva  for now, but consider referral to endocrinology to discuss infusions if unable to get Prolia  in future and if scores show no improvement on next scan.  Repeat DEXA around 02/17/25.        Other   Recurrent major depression-severe (HCC) - Primary   Chronic, stable.  Loss of mother in January 2024, whom she lived with for 30 years.  Denies SI/HI.  Currently following with psychiatry, will continue this collaboration and medication regimen as ordered by them. Continue therapy sessions.      Relevant Medications   DULoxetine  (CYMBALTA ) 20 MG capsule   sertraline  (ZOLOFT) 50 MG tablet   Obesity   BMI 30.62.  Recommended eating smaller high protein, low fat meals more frequently and exercising 30 mins a day 5 times a week with a goal of 10-15lb weight loss in the next 3 months. Patient voiced their understanding and motivation to adhere to these recommendations.       Mixed hyperlipidemia   Ongoing with some muscle aches with daily statin.  Discussed at length risks and benefits of continuing statin.  Tolerating Crestor  10 MG weekly.  Labs: CMP and lipid panel today.      Relevant Orders   Comprehensive metabolic panel with GFR   Lipid Panel w/o Chol/HDL Ratio      Follow up plan: Return in about 6 months (around 07/21/2024) for Annual Physical -- after 07/18/24.

## 2024-01-15 NOTE — Assessment & Plan Note (Signed)
 Chronic, ongoing with poor tolerance to Fosamax in past.  She would like to try Prolia , but too costly coverage wise.  Continue Boniva  for now, but consider referral to endocrinology to discuss infusions if unable to get Prolia  in future and if scores show no improvement on next scan.  Repeat DEXA around 02/17/25.

## 2024-01-15 NOTE — Assessment & Plan Note (Signed)
BMI 30.62.  Recommended eating smaller high protein, low fat meals more frequently and exercising 30 mins a day 5 times a week with a goal of 10-15lb weight loss in the next 3 months. Patient voiced their understanding and motivation to adhere to these recommendations.  

## 2024-01-15 NOTE — Assessment & Plan Note (Signed)
 Ongoing with some muscle aches with daily statin.  Discussed at length risks and benefits of continuing statin.  Tolerating Crestor  10 MG weekly.  Labs: CMP and lipid panel today.

## 2024-01-15 NOTE — Assessment & Plan Note (Signed)
 Chronic, stable.  Loss of mother in January 2024, whom she lived with for 30 years.  Denies SI/HI.  Currently following with psychiatry, will continue this collaboration and medication regimen as ordered by them. Continue therapy sessions.

## 2024-01-16 ENCOUNTER — Ambulatory Visit: Admitting: Nurse Practitioner

## 2024-01-16 DIAGNOSIS — F411 Generalized anxiety disorder: Secondary | ICD-10-CM | POA: Diagnosis not present

## 2024-01-16 DIAGNOSIS — F5105 Insomnia due to other mental disorder: Secondary | ICD-10-CM | POA: Diagnosis not present

## 2024-01-16 DIAGNOSIS — F331 Major depressive disorder, recurrent, moderate: Secondary | ICD-10-CM | POA: Diagnosis not present

## 2024-01-16 LAB — COMPREHENSIVE METABOLIC PANEL WITH GFR
ALT: 11 IU/L (ref 0–32)
AST: 15 IU/L (ref 0–40)
Albumin: 4.3 g/dL (ref 3.8–4.8)
Alkaline Phosphatase: 81 IU/L (ref 49–135)
BUN/Creatinine Ratio: 17 (ref 12–28)
BUN: 15 mg/dL (ref 8–27)
Bilirubin Total: 0.4 mg/dL (ref 0.0–1.2)
CO2: 24 mmol/L (ref 20–29)
Calcium: 10.3 mg/dL (ref 8.7–10.3)
Chloride: 106 mmol/L (ref 96–106)
Creatinine, Ser: 0.88 mg/dL (ref 0.57–1.00)
Globulin, Total: 2.3 g/dL (ref 1.5–4.5)
Glucose: 123 mg/dL — ABNORMAL HIGH (ref 70–99)
Potassium: 4.2 mmol/L (ref 3.5–5.2)
Sodium: 143 mmol/L (ref 134–144)
Total Protein: 6.6 g/dL (ref 6.0–8.5)
eGFR: 70 mL/min/1.73 (ref >59)

## 2024-01-16 LAB — LIPID PANEL W/O CHOL/HDL RATIO
Cholesterol, Total: 178 mg/dL (ref 100–199)
HDL: 49 mg/dL (ref >39)
LDL Chol Calc (NIH): 88 mg/dL (ref 0–99)
Triglycerides: 246 mg/dL — ABNORMAL HIGH (ref 0–149)
VLDL Cholesterol Cal: 41 mg/dL — ABNORMAL HIGH (ref 5–40)

## 2024-01-17 ENCOUNTER — Ambulatory Visit: Payer: Self-pay | Admitting: Nurse Practitioner

## 2024-01-17 ENCOUNTER — Encounter: Admitting: Clinical

## 2024-01-17 NOTE — Progress Notes (Signed)
                Day Deery, LCSWThis encounter was created in error - please disregard. 

## 2024-01-17 NOTE — Progress Notes (Signed)
 Contacted via MyChart  Good day Marie Perry, your labs have returned: - Kidney function, creatinine and eGFR, remains normal, as is liver function, AST and ALT.  - Lipid panel shows LDL stable, but triglycerides a bit elevated. We will recheck fasting at physical. Any questions? Keep being amazing!!  Thank you for allowing me to participate in your care.  I appreciate you. Kindest regards, Safi Culotta

## 2024-02-10 ENCOUNTER — Ambulatory Visit: Admitting: Clinical

## 2024-02-10 DIAGNOSIS — F331 Major depressive disorder, recurrent, moderate: Secondary | ICD-10-CM

## 2024-02-10 DIAGNOSIS — F418 Other specified anxiety disorders: Secondary | ICD-10-CM

## 2024-02-10 NOTE — Progress Notes (Signed)
 "  [facility] Counselor/Therapist Progress Note  Marie Perry ID: Marie Perry, MRN: [id number],    Date: 02/10/2024  Time Spent: 2:32pm - 3:23pm : 51 minutes   Treatment Type: Individual Therapy  Reported Symptoms: sadness, crying at times  Mental Status Exam: Appearance:  Neat and Well Groomed     Behavior: Drowsy  Motor: Normal  Speech/Language:  Clear and Coherent and Normal Rate  Affect: Appropriate  Mood: normal  Thought process: normal  Thought content:   WNL  Sensory/Perceptual disturbances:   WNL  Orientation: oriented to person, place, time/date, and situation  Attention: Good  Concentration: Good  Memory: WNL  Fund of knowledge:  Good  Insight:   Good  Judgment:  Good  Impulse Control: Good   Risk Assessment: Danger to Self:  No Marie Perry denied current suicidal ideation  Self-injurious Behavior: No Danger to Others: No Marie Perry denied current homicidal ideation Duty to Warn:no Physical Aggression / Violence:No  Access to Firearms a concern: No  Gang Involvement:No   Subjective: Marie Perry reported Marie Perry missed previous therapy appointment. Marie Perry reported Marie Perry thought Marie Perry had an appointment and could not locate appointment on Marie calendar. Marie Perry reported Marie family is assisting Marie Perry with costs of roof repairs and Marie Perry stated, I got that off my mind. Marie Perry stated, I think I've been doing better in reference to depressive symptoms and recent changes in medications. Marie Perry stated, I have my moments in reference to sadness/crying. Marie Perry reported experiencing thoughts related to Marie Perry during the holidays. Marie Perry stated, I was trying to stay busy. Marie Perry reported a decrease in sadness/crying associated with grief this holiday season. Marie Perry reported the following coping strategies in response to grief: calling a friend, spent time with friends, went to [employer], attending weekly faith based meetings,  reading.  Marie Perry reported cold or overcast weather is a barrier to leaving the house. Marie Perry reported Marie Perry has shopped for groceries in person several times since last session.  Marie Perry stated, I know that I'll feel better after I go in reference to attending social opportunities. Marie Perry reported Marie Perry recently invited friends to lunch and purchased friends' meals. Marie Perry stated,  pretty good in response to mood today. Marie Perry reported feeling sleepy.   Interventions: Cognitive Behavioral Therapy. Clinician conducted session via caregility video from clinician's home office. Marie Perry provided verbal consent to proceed with telehealth session and is aware of limitations of telephone or video visits. Marie Perry participated in session from Marie home. Discussed recent missed appointment. Reviewed events since last session and assessed for changes. Discussed status of recent stressors and depressive symptoms. Discussed Marie experience during recent holidays and coping strategies Marie Perry utilized in response to grief. Reviewed behavioral activation and opposite action. Explored barriers to practicing behavioral activation strategies and opposite action. Reviewed challenging negative thoughts patterns/what ifs and behavioral activation strategies to complete tasks and participate in activities outside of Marie home. Provided psycho education related to socialization and depression. Provided psycho education related to gratitude exercises. Clinician requested for homework Marie Perry practice opposite action and behavioral activation strategies.   Collaboration of Care: Other not required at this time   Diagnosis:  Major depressive disorder, recurrent episode, moderate with anxious distress     Plan: Marie Perry is to utilize [employer] [facility], thought re-framing, behavioral activation, and coping strategies to decrease symptoms associated with their diagnosis. Frequency: bi-weekly   Modality: individual      Long-term goal:   Reduce overall level, frequency, and intensity of the feelings of depression as evidenced  by decreased crying, difficulty falling asleep and staying asleep, changes in appetite, lack of energy, fatigue, lack of motivation, difficulty focusing, and loss of interest from 7 days/week to 2 to 3 days/week per Marie Perry report for at least 3 consecutive months. Target Date: 08-21-24  Progress: progressing    Short-term goal:  Begin a healthy grieving process around the loss of Marie Perry Target Date: 2024-08-21  Progress: progressing    Decrease negative thoughts associated with Marie Perry's death Target Date: 21-Aug-2024  Progress: progressing    Identify, challenge, and replace negative thought patterns that contribute to feelings of depression with positive thoughts and beliefs per Marie report Target Date: 08/21/2024  Progress: progressing    Increase activities that Marie Perry enjoys to support increased motivation and mood, such as, socializing with friends weekly, completing puzzles, playing games. Target Date: 21-Aug-2024  Progress: progressing       [name], LCSW    "

## 2024-02-10 NOTE — Progress Notes (Signed)
   [name], LCSW

## 2024-02-26 ENCOUNTER — Other Ambulatory Visit: Payer: Self-pay | Admitting: Nurse Practitioner

## 2024-02-26 DIAGNOSIS — Z1231 Encounter for screening mammogram for malignant neoplasm of breast: Secondary | ICD-10-CM

## 2024-03-11 ENCOUNTER — Other Ambulatory Visit: Payer: Self-pay | Admitting: Nurse Practitioner

## 2024-03-12 NOTE — Telephone Encounter (Signed)
 Requested medications are due for refill today.  yes  Requested medications are on the active medications list.  yes  Last refill. 12/13/2023 3 0 rf  Future visit scheduled.   yes  Notes to clinic.  Expired labs    Requested Prescriptions  Pending Prescriptions Disp Refills   ibandronate  (BONIVA ) 150 MG tablet [Pharmacy Med Name: IBANDRONATE  SODIUM 150 MG TAB] 3 tablet 0    Sig: TAKE 1 TABLET (150 MG TOTAL) BY MOUTH EVERY 30 (THIRTY) DAYS. TAKE IN THE MORNING WITH A FULL GLASS OF WATER, ON AN EMPTY STOMACH, AND DO NOT TAKE ANYTHING ELSE BY MOUTH OR LIE DOWN FOR THE NEXT 30 MIN.     Endocrinology:  Bisphosphonates Failed - 03/12/2024 12:33 PM      Failed - Mg Level in normal range and within 360 days    Magnesium  Date Value Ref Range Status  06/12/2021 2.2 1.6 - 2.3 mg/dL Final         Failed - Phosphate in normal range and within 360 days    No results found for: PHOS       Passed - Ca in normal range and within 360 days    Calcium   Date Value Ref Range Status  01/15/2024 10.3 8.7 - 10.3 mg/dL Final         Passed - Vitamin D  in normal range and within 360 days    Vit D, 25-Hydroxy  Date Value Ref Range Status  07/19/2023 42.9 30.0 - 100.0 ng/mL Final    Comment:    Vitamin D  deficiency has been defined by the Institute of Medicine and an Endocrine Society practice guideline as a level of serum 25-OH vitamin D  less than 20 ng/mL (1,2). The Endocrine Society went on to further define vitamin D  insufficiency as a level between 21 and 29 ng/mL (2). 1. IOM (Institute of Medicine). 2010. Dietary reference    intakes for calcium  and D. [place]: The    [employer]. 2. Holick MF, Binkley [place], Bischoff-Ferrari HA, et al.    Evaluation, treatment, and prevention of vitamin D     deficiency: an Endocrine Society clinical practice    guideline. JCEM. 2011 Jul; 96(7):1911-30.          Passed - Cr in normal range and within 360 days    Creatinine, Ser   Date Value Ref Range Status  01/15/2024 0.88 0.57 - 1.00 mg/dL Final         Passed - eGFR is 30 or above and within 360 days    GFR calc Af Amer  Date Value Ref Range Status  05/08/2019 70 >59 mL/min/1.73 Final   GFR calc non Af Amer  Date Value Ref Range Status  05/08/2019 61 >59 mL/min/1.73 Final   eGFR  Date Value Ref Range Status  01/15/2024 70 >59 mL/min/1.73 Final         Passed - Valid encounter within last 12 months    Recent Outpatient Visits           1 month ago Severe episode of recurrent major depressive disorder, without psychotic features (HCC)   [facility] [place], Jolene T, NP   5 months ago Severe episode of recurrent major depressive disorder, without psychotic features (HCC)   [facility] [place], Jolene T, NP   6 months ago Severe episode of recurrent major depressive disorder, without psychotic features (HCC)   [facility] [place], Melanie DASEN, NP  7 months ago Severe episode of recurrent major depressive disorder, without psychotic features (HCC)   [facility] [place], Farrell T, NP              Passed - Bone Mineral Density or Dexa Scan completed in the last 2 years

## 2024-03-13 ENCOUNTER — Ambulatory Visit: Admitting: Clinical

## 2024-03-13 DIAGNOSIS — F331 Major depressive disorder, recurrent, moderate: Secondary | ICD-10-CM

## 2024-03-13 NOTE — Progress Notes (Signed)
   [name], LCSW

## 2024-03-13 NOTE — Progress Notes (Signed)
 "  [facility] Counselor/Therapist Progress Note  Patient ID: Marie Perry, MRN: [id number],    Date: 03/13/2024  Time Spent: 9:35am - 10:24am : 49 minutes   Treatment Type: Individual Therapy  Reported Symptoms: sadness, tearful, decreased motivation  Mental Status Exam: Appearance:  Neat and Well Groomed     Behavior: Appropriate  Motor: Normal  Speech/Language:  Clear and Coherent and Normal Rate  Affect: Tearful when discussing loss of mother  Mood: sad  Thought process: normal  Thought content:   WNL  Sensory/Perceptual disturbances:   WNL  Orientation: oriented to person, place, time/date, and situation  Attention: Good  Concentration: Good  Memory: WNL  Fund of knowledge:  Good  Insight:   Good  Judgment:  Good  Impulse Control: Good   Risk Assessment: Danger to Self:  No Patient denied current suicidal ideation  Self-injurious Behavior: No Danger to Others: No Patient denied current homicidal ideation Duty to Warn:no Physical Aggression / Violence:No  Access to Firearms a concern: No  Gang Involvement:No   Subjective: Patient reported recent visit with psychiatrist and reported psychiatrist increased duloxetine  dosage. Patient reported patient did not want to be at home on the anniversary of patient's mother's death in 02/25/24 and traveled to visit family in [place]. Patient reported patient and brother spent time together during patient's trip. Patient stated, it was in reference to trip being helpful and reported patient was able to have conversations with brother regarding patient's mother and family memories. Patient stated, I really dislike this time of year in reference to sadness during session. Patient stated, because we (patient/mother) lived here in response to triggers for grief associated with patient's home. Patient stated, trying to adjust things so it feels more like my home. Patient reported loss of a roommate and loss of a  friend are additional losses associated with the loss of patient's mother. Patient reported patient's sister was killed when patient was 36 years old and patient stated, I was younger and think I was better able to handle. Patient reported patient's focus was supporting patient's mother after the loss of patient's sister. Patient reported the loss of a grandmother. Patient reported pretty good in response to mood since last session.    Interventions: Cognitive Behavioral Therapy. Clinician conducted session via caregility video from clinician's home office. Patient provided verbal consent to proceed with telehealth session and is aware of limitations of telephone or video visits. Patient participated in session from patient's home. Reviewed events since last session and assessed for changes. Discussed recent appointment with psychiatrist. Discussed coping strategies patient utilized in response to the anniversary of mother's death.  Explored and identified triggers for sadness during session and triggers for grief. Provided psycho education related to grief/grieving process. Explored and identified additional losses associated with loss of patient's mother. Provided psycho education related to triggers for grief. Explored previous experiences with loss/grief. Clinician requested for homework patient practicing journaling.    Collaboration of Care: Other not required at this time   Diagnosis:  Major depressive disorder, recurrent episode, moderate with anxious distress     Plan: Patient is to utilize [employer] Therapy, thought re-framing, behavioral activation, and coping strategies to decrease symptoms associated with their diagnosis. Frequency: bi-weekly  Modality: individual      Long-term goal:   Reduce overall level, frequency, and intensity of the feelings of depression as evidenced by decreased crying, difficulty falling asleep and staying asleep, changes in appetite, lack of  energy, fatigue, lack of  motivation, difficulty focusing, and loss of interest from 7 days/week to 2 to 3 days/week per patient report for at least 3 consecutive months. Target Date: 08/27/2024  Progress: progressing    Short-term goal:  Begin a healthy grieving process around the loss of patient's mother Target Date: 08/27/2024  Progress: progressing    Decrease negative thoughts associated with patient's mother's death Target Date: Aug 27, 2024  Progress: progressing    Identify, challenge, and replace negative thought patterns that contribute to feelings of depression with positive thoughts and beliefs per patient's report Target Date: 08-27-2024  Progress: progressing    Increase activities that patient enjoys to support increased motivation and mood, such as, socializing with friends weekly, completing puzzles, playing games. Target Date: August 27, 2024  Progress: progressing     [name], LCSW    "

## 2024-03-26 ENCOUNTER — Encounter

## 2024-04-10 ENCOUNTER — Ambulatory Visit: Admitting: Clinical

## 2024-04-21 ENCOUNTER — Ambulatory Visit

## 2024-04-30 ENCOUNTER — Ambulatory Visit

## 2024-07-23 ENCOUNTER — Encounter: Admitting: Nurse Practitioner
